# Patient Record
Sex: Female | Born: 1960
Health system: Southern US, Community
[De-identification: ages and names within clinical notes are randomized; demographics above are authoritative.]

## PROBLEM LIST (undated history)

## (undated) DIAGNOSIS — R519 Headache, unspecified: Secondary | ICD-10-CM

## (undated) DIAGNOSIS — K219 Gastro-esophageal reflux disease without esophagitis: Secondary | ICD-10-CM

## (undated) DIAGNOSIS — G4730 Sleep apnea, unspecified: Secondary | ICD-10-CM

## (undated) DIAGNOSIS — M1990 Unspecified osteoarthritis, unspecified site: Secondary | ICD-10-CM

## (undated) DIAGNOSIS — R0600 Dyspnea, unspecified: Secondary | ICD-10-CM

## (undated) DIAGNOSIS — R7303 Prediabetes: Secondary | ICD-10-CM

## (undated) DIAGNOSIS — I739 Peripheral vascular disease, unspecified: Secondary | ICD-10-CM

## (undated) DIAGNOSIS — J189 Pneumonia, unspecified organism: Secondary | ICD-10-CM

## (undated) DIAGNOSIS — I10 Essential (primary) hypertension: Secondary | ICD-10-CM

## (undated) DIAGNOSIS — F329 Major depressive disorder, single episode, unspecified: Secondary | ICD-10-CM

## (undated) DIAGNOSIS — T8859XA Other complications of anesthesia, initial encounter: Secondary | ICD-10-CM

## (undated) DIAGNOSIS — I509 Heart failure, unspecified: Secondary | ICD-10-CM

## (undated) DIAGNOSIS — J45909 Unspecified asthma, uncomplicated: Secondary | ICD-10-CM

## (undated) HISTORY — PX: APPENDECTOMY: SHX54

## (undated) HISTORY — DX: Major depressive disorder, single episode, unspecified: F32.9

## (undated) HISTORY — PX: OTHER SURGICAL HISTORY: SHX169

## (undated) HISTORY — DX: Unspecified asthma, uncomplicated: J45.909

## (undated) HISTORY — PX: ABDOMINAL HYSTERECTOMY: SHX81

## (undated) HISTORY — PX: TONSILLECTOMY: SUR1361

---

## 2010-04-23 DIAGNOSIS — D329 Benign neoplasm of meninges, unspecified: Secondary | ICD-10-CM

## 2010-04-23 HISTORY — DX: Benign neoplasm of meninges, unspecified: D32.9

## 2010-04-26 DIAGNOSIS — R600 Localized edema: Secondary | ICD-10-CM

## 2010-04-26 DIAGNOSIS — I509 Heart failure, unspecified: Secondary | ICD-10-CM | POA: Insufficient documentation

## 2010-04-26 HISTORY — DX: Heart failure, unspecified: I50.9

## 2010-04-26 HISTORY — DX: Localized edema: R60.0

## 2010-05-07 DIAGNOSIS — R079 Chest pain, unspecified: Secondary | ICD-10-CM

## 2010-05-07 HISTORY — DX: Chest pain, unspecified: R07.9

## 2015-08-29 HISTORY — PX: OTHER SURGICAL HISTORY: SHX169

## 2015-08-29 HISTORY — PX: ABDOMINAL HYSTERECTOMY: SHX81

## 2016-02-11 DIAGNOSIS — E7800 Pure hypercholesterolemia, unspecified: Secondary | ICD-10-CM

## 2016-02-11 DIAGNOSIS — J449 Chronic obstructive pulmonary disease, unspecified: Secondary | ICD-10-CM

## 2016-02-11 DIAGNOSIS — Z720 Tobacco use: Secondary | ICD-10-CM | POA: Diagnosis present

## 2016-02-11 HISTORY — DX: Pure hypercholesterolemia, unspecified: E78.00

## 2016-02-11 HISTORY — DX: Chronic obstructive pulmonary disease, unspecified: J44.9

## 2017-11-09 DIAGNOSIS — J441 Chronic obstructive pulmonary disease with (acute) exacerbation: Secondary | ICD-10-CM

## 2017-11-09 HISTORY — DX: Chronic obstructive pulmonary disease with (acute) exacerbation: J44.1

## 2018-03-05 DIAGNOSIS — J984 Other disorders of lung: Secondary | ICD-10-CM | POA: Insufficient documentation

## 2018-03-05 HISTORY — DX: Other disorders of lung: J98.4

## 2018-05-01 DIAGNOSIS — J4530 Mild persistent asthma, uncomplicated: Secondary | ICD-10-CM

## 2018-05-01 HISTORY — DX: Mild persistent asthma, uncomplicated: J45.30

## 2018-08-14 DIAGNOSIS — K219 Gastro-esophageal reflux disease without esophagitis: Secondary | ICD-10-CM | POA: Insufficient documentation

## 2018-08-14 HISTORY — DX: Gastro-esophageal reflux disease without esophagitis: K21.9

## 2018-08-28 DIAGNOSIS — J189 Pneumonia, unspecified organism: Secondary | ICD-10-CM

## 2018-08-28 HISTORY — DX: Pneumonia, unspecified organism: J18.9

## 2019-01-06 DIAGNOSIS — R002 Palpitations: Secondary | ICD-10-CM

## 2019-01-06 HISTORY — DX: Palpitations: R00.2

## 2019-08-11 DIAGNOSIS — I70211 Atherosclerosis of native arteries of extremities with intermittent claudication, right leg: Secondary | ICD-10-CM

## 2019-08-11 HISTORY — DX: Atherosclerosis of native arteries of extremities with intermittent claudication, right leg: I70.211

## 2020-07-20 DIAGNOSIS — Z8616 Personal history of COVID-19: Secondary | ICD-10-CM | POA: Insufficient documentation

## 2020-07-20 HISTORY — DX: Personal history of COVID-19: Z86.16

## 2020-11-10 DIAGNOSIS — M1711 Unilateral primary osteoarthritis, right knee: Secondary | ICD-10-CM

## 2020-11-10 DIAGNOSIS — G510 Bell's palsy: Secondary | ICD-10-CM

## 2020-11-10 DIAGNOSIS — Z9989 Dependence on other enabling machines and devices: Secondary | ICD-10-CM | POA: Insufficient documentation

## 2020-11-10 HISTORY — DX: Unilateral primary osteoarthritis, right knee: M17.11

## 2020-11-10 HISTORY — DX: Bell's palsy: G51.0

## 2021-05-16 ENCOUNTER — Encounter (HOSPITAL_COMMUNITY): Payer: Self-pay | Admitting: *Deleted

## 2021-05-16 ENCOUNTER — Emergency Department (HOSPITAL_COMMUNITY): Payer: Medicare HMO

## 2021-05-16 ENCOUNTER — Emergency Department (HOSPITAL_COMMUNITY)
Admission: EM | Admit: 2021-05-16 | Discharge: 2021-05-16 | Disposition: A | Discharge status: Home / Self Care | Payer: Medicare HMO | Attending: Emergency Medicine | Admitting: Emergency Medicine

## 2021-05-16 ENCOUNTER — Encounter: Payer: Self-pay | Admitting: Emergency Medicine

## 2021-05-16 ENCOUNTER — Emergency Department
Admission: EM | Admit: 2021-05-16 | Discharge: 2021-05-16 | Disposition: A | Discharge status: Home / Self Care | Payer: Medicare HMO | Source: Home / Self Care

## 2021-05-16 DIAGNOSIS — M7989 Other specified soft tissue disorders: Secondary | ICD-10-CM | POA: Insufficient documentation

## 2021-05-16 DIAGNOSIS — W19XXXA Unspecified fall, initial encounter: Secondary | ICD-10-CM | POA: Insufficient documentation

## 2021-05-16 DIAGNOSIS — M25462 Effusion, left knee: Secondary | ICD-10-CM | POA: Diagnosis not present

## 2021-05-16 DIAGNOSIS — Z5321 Procedure and treatment not carried out due to patient leaving prior to being seen by health care provider: Secondary | ICD-10-CM | POA: Diagnosis not present

## 2021-05-16 DIAGNOSIS — M25562 Pain in left knee: Secondary | ICD-10-CM

## 2021-05-16 HISTORY — DX: Essential (primary) hypertension: I10

## 2021-05-16 HISTORY — DX: Heart failure, unspecified: I50.9

## 2021-05-16 MED ORDER — ACETAMINOPHEN 325 MG PO TABS
650.0000 mg | ORAL_TABLET | Freq: Once | ORAL | Status: AC
  Administered 2021-05-16: 650 mg via ORAL
  Filled 2021-05-16: qty 2

## 2021-05-16 NOTE — Discharge Instructions (Addendum)
Advised/encouraged patient to wear left knee brace 24/7 (except when bathing) until follow-up with orthopedic provider.  Advised patient to follow-up with [facility] health orthopedic provider tomorrow, Tuesday, 05/17/2021.

## 2021-05-16 NOTE — ED Provider Notes (Signed)
[facility]    CSN: [id number] Arrival date & time: 05/16/21  2018      History   Chief Complaint Chief Complaint  Patient presents with   Knee Injury    HPI Kaitlan Bin is a 60 y.o. female.   HPI 60 year old female presents with left knee pain since injury 1 day ago.  Patient reports falling and tripping over a step stool yesterday when trying to fix her washer machine.  Patient is currently wearing knee braces on both knees.  Patient had knee x-ray performed earlier at other [facility] health urgent care clinic.  Past Medical History:  Diagnosis Date   CHF (congestive heart failure) ([place])    Hypertension     There are no problems to display for this patient.   History reviewed. No pertinent surgical history.  OB History   No obstetric history on file.      Home Medications    Prior to Admission medications   Medication Sig Start Date End Date Taking? Authorizing Provider  diclofenac (VOLTAREN) 75 MG EC tablet Take 75 mg by mouth daily. 03/10/21  Yes [provider]  losartan-hydrochlorothiazide (HYZAAR) 100-25 MG tablet losartan 100 mg-hydrochlorothiazide 25 mg tablet   Yes [provider]  simvastatin (ZOCOR) 40 MG tablet Take by mouth.   Yes [provider]    Family History History reviewed. No pertinent family history.  Social History Social History   Tobacco Use   Smoking status: Every Day    Types: Cigarettes   Smokeless tobacco: Never  Vaping Use   Vaping Use: Never used  Substance Use Topics   Alcohol use: Not Currently   Drug use: Never     Allergies   Codeine and Iodine   Review of Systems Review of Systems  Musculoskeletal:        Left knee pain x1 day secondary to left knee injury  All other systems reviewed and are negative.   Physical Exam Triage Vital Signs ED Triage Vitals  Enc Vitals Group     BP 05/16/21 2034 120/75     Pulse Rate 05/16/21 2034 80     Resp 05/16/21 2034 18     Temp  05/16/21 2034 98.4 F (36.9 C)     Temp Source 05/16/21 2034 Oral     SpO2 05/16/21 2034 94 %     Weight --      Height --      Head Circumference --      Peak Flow --      Pain Score 05/16/21 2029 8     Pain Loc --      Pain Edu? --      Excl. in [place]? --    No data found.  Updated Vital Signs BP 120/75 (BP Location: Left Arm)   Pulse 80   Temp 98.4 F (36.9 C) (Oral)   Resp 18   SpO2 94%   Physical Exam Vitals and nursing note reviewed.  Constitutional:      General: She is not in acute distress.    Appearance: Normal appearance. She is obese. She is not ill-appearing.  HENT:     Head: Normocephalic and atraumatic.     Mouth/Throat:     Mouth: Mucous membranes are moist.     Pharynx: Oropharynx is clear.  Eyes:     Extraocular Movements: Extraocular movements intact.     Conjunctiva/sclera: Conjunctivae normal.     Pupils: Pupils are equal, round, and reactive to  light.  Cardiovascular:     Rate and Rhythm: Normal rate and regular rhythm.     Pulses: Normal pulses.     Heart sounds: Normal heart sounds. No murmur heard. Pulmonary:     Effort: Pulmonary effort is normal.     Breath sounds: Normal breath sounds.     Comments: No adventitious breath sounds noted Musculoskeletal:        General: Swelling, tenderness and signs of injury present. No deformity.     Cervical back: Normal range of motion and neck supple.     Comments: Left knee (anterior aspect): TTP over patellar surface, with soft tissue swelling noted, exam limited this evening due to pain  Skin:    General: Skin is warm and dry.  Neurological:     General: No focal deficit present.     Mental Status: She is alert and oriented to person, place, and time. Mental status is at baseline.  Psychiatric:        Mood and Affect: Mood normal.        Behavior: Behavior normal.     UC Treatments / Results  Labs (all labs ordered are listed, but only abnormal results are displayed) Labs Reviewed - No data  to display  EKG   Radiology DG Knee Complete 4 Views Left  Result Date: 05/16/2021 CLINICAL DATA:  [place] yesterday with knee pain EXAM: LEFT KNEE - COMPLETE 4+ VIEW COMPARISON:  None. FINDINGS: Large knee joint effusion. Chronic tricompartmental osteoarthritis with joint space narrowing and osteophytes. Question loose body posterior to the distal femur. I do not see an acute fracture. IMPRESSION: Large joint effusion. Tricompartmental osteoarthritis with suspicion of a loose body posterior to the femur. No acute fracture identified. Electronically Signed   By: Nelson Chimes M.D.   On: 05/16/2021 13:31    Procedures Procedures (including critical care time)  Medications Ordered in UC Medications - No data to display  Initial Impression / Assessment and Plan / UC Course  I have reviewed the triage vital signs and the nursing notes.  Pertinent labs & imaging results that were available during my care of the patient were reviewed by me and considered in my medical decision making (see chart for details).     MDM: 1.  Acute pain of left knee-Advised/encouraged patient to wear left knee brace 24/7 (except when bathing) until follow-up with orthopedic provider.  Advised patient to follow-up with [facility] health orthopedic provider tomorrow, Tuesday, 05/17/2021.  Patient discharged home, hemodynamically stable. Final Clinical Impressions(s) / UC Diagnoses   Final diagnoses:  Acute pain of left knee     Discharge Instructions      Advised/encouraged patient to wear left knee brace 24/7 (except when bathing) until follow-up with orthopedic provider.  Advised patient to follow-up with [facility] health orthopedic provider tomorrow, Tuesday, 05/17/2021.     ED Prescriptions   None    PDMP not reviewed this encounter.   Eliezer Lofts, [place] 05/16/21 2115

## 2021-05-16 NOTE — ED Triage Notes (Addendum)
Patient presents to Urgent Care with complaints of left knee injury since 1 day ago. Patient reports falling/tripping of step stool yesterday when trying to fix her washing machine. Is currently wearing knee braces on bilateral knees. Applied ice, heat and epsom salt to the knee.  She already had xray of knee.

## 2021-05-16 NOTE — ED Triage Notes (Signed)
Pt complains of left knee pain and swelling since falling yesterday.

## 2021-05-17 ENCOUNTER — Ambulatory Visit: Payer: Self-pay

## 2021-05-17 ENCOUNTER — Ambulatory Visit: Payer: Medicare HMO | Admitting: Family Medicine

## 2021-05-17 ENCOUNTER — Other Ambulatory Visit: Payer: Self-pay

## 2021-05-17 VITALS — Ht 62.0 in | Wt 180.0 lb

## 2021-05-17 DIAGNOSIS — M179 Osteoarthritis of knee, unspecified: Secondary | ICD-10-CM | POA: Insufficient documentation

## 2021-05-17 DIAGNOSIS — M1712 Unilateral primary osteoarthritis, left knee: Secondary | ICD-10-CM

## 2021-05-17 DIAGNOSIS — M25562 Pain in left knee: Secondary | ICD-10-CM

## 2021-05-17 MED ORDER — TRIAMCINOLONE ACETONIDE 40 MG/ML IJ SUSP
40.0000 mg | Freq: Once | INTRAMUSCULAR | Status: AC
  Administered 2021-05-17: 40 mg via INTRA_ARTICULAR

## 2021-05-17 NOTE — Progress Notes (Signed)
Samantha Luna - 60 y.o. female MRN [id number]  Date of birth: 11-30-1960  SUBJECTIVE:  Including CC & ROS.  No chief complaint on file.   Samantha Luna is a 60 y.o. female that is presenting with acute on chronic left knee pain.  She had a fall recently straight onto the knee.  Since that time she has had swelling and pain globally around the knee.  Independent review of the left knee x-ray from 9/19 shows large joint effusion with suspicion for loose body in the posterior femur.   Review of Systems See HPI   HISTORY: Past Medical, Surgical, Social, and Family History Reviewed & Updated per EMR.   Pertinent Historical Findings include:  Past Medical History:  Diagnosis Date   CHF (congestive heart failure) ([place])    Hypertension     No past surgical history on file.  No family history on file.  Social History   Socioeconomic History   Marital status: Single    Spouse name: Not on file   Number of children: Not on file   Years of education: Not on file   Highest education level: Not on file  Occupational History   Not on file  Tobacco Use   Smoking status: Every Day    Types: Cigarettes   Smokeless tobacco: Never  Vaping Use   Vaping Use: Never used  Substance and Sexual Activity   Alcohol use: Not Currently   Drug use: Never   Sexual activity: Not Currently  Other Topics Concern   Not on file  Social History Narrative   Not on file   Social Determinants of Health   Financial Resource Strain: Not on file  Food Insecurity: Not on file  Transportation Needs: Not on file  Physical Activity: Not on file  Stress: Not on file  Social Connections: Not on file  Intimate Partner Violence: Not on file     PHYSICAL EXAM:  VS: Ht 5\' 2"  (1.575 m)   Wt 180 lb (81.6 kg)   BMI 32.92 kg/m  Physical Exam Gen: NAD, alert, cooperative with exam, well-appearing   Limited ultrasound: Left knee:  Moderate effusion. Normal-appearing quadricep patellar tendon. Severe joint  space narrowing of the medial compartment  Summary: Osteoarthritis with effusion  Ultrasound and interpretation by Clearance Coots, MD   Aspiration/Injection Procedure Note Samantha Luna [birth year]  Procedure: Aspiration and Injection Indications: Left knee pain  Procedure Details Consent: Risks of procedure as well as the alternatives and risks of each were explained to the (patient/caregiver).  Consent for procedure obtained. Time Out: Verified patient identification, verified procedure, site/side was marked, verified correct patient position, special equipment/implants available, medications/allergies/relevent history reviewed, required imaging and test results available.  Performed.  The area was cleaned with iodine and alcohol swabs.    The left knee superior lateral suprapatellar pouch was injected using 3 cc of 1% lidocaine on a 25-gauge 1-1/2 inch needle.  An 18-gauge 1-1/2 inch needle was used to achieve aspiration.  The syringe was switched to mixture containing 1 cc's of 40 mg Kenalog and 4 cc's of 0.25% bupivacaine was injected.  Ultrasound was used. Images were obtained in long views showing the injection.    Amount of Fluid Aspirated:  14mL Character of Fluid: bloody Fluid was sent for: n/a  A sterile dressing was applied.  Patient did tolerate procedure well.     ASSESSMENT & PLAN:   Primary osteoarthritis of left knee Had a recent fall onto her knee.  Having a large  hemarthrosis with severe degenerative changes on exam today. -Counseled on home exercise therapy and supportive care. -Aspiration and injection. -Referral to orthopedic surgery for consideration of arthroplasty

## 2021-05-17 NOTE — Patient Instructions (Signed)
Nice to meet you Please try ice  Please try the exercises   Please send me a message in MyChart with any questions or updates.  Please see me back in 3 weeks.   --Dr. Raeford Razor

## 2021-05-17 NOTE — Assessment & Plan Note (Signed)
Had a recent fall onto her knee.  Having a large hemarthrosis with severe degenerative changes on exam today. -Counseled on home exercise therapy and supportive care. -Aspiration and injection. -Referral to orthopedic surgery for consideration of arthroplasty

## 2021-05-17 NOTE — ED Notes (Signed)
Pt was still on the board at 0800 on 05/17/21 - discharged off board by this RN - no care provided

## 2021-05-20 DIAGNOSIS — E876 Hypokalemia: Secondary | ICD-10-CM | POA: Diagnosis not present

## 2021-05-20 DIAGNOSIS — I739 Peripheral vascular disease, unspecified: Secondary | ICD-10-CM | POA: Diagnosis not present

## 2021-05-20 DIAGNOSIS — E669 Obesity, unspecified: Secondary | ICD-10-CM | POA: Diagnosis not present

## 2021-05-20 DIAGNOSIS — F4323 Adjustment disorder with mixed anxiety and depressed mood: Secondary | ICD-10-CM | POA: Diagnosis not present

## 2021-05-20 DIAGNOSIS — R7303 Prediabetes: Secondary | ICD-10-CM | POA: Diagnosis not present

## 2021-05-20 DIAGNOSIS — Z720 Tobacco use: Secondary | ICD-10-CM | POA: Diagnosis not present

## 2021-05-20 DIAGNOSIS — M25562 Pain in left knee: Secondary | ICD-10-CM | POA: Diagnosis not present

## 2021-05-20 DIAGNOSIS — E782 Mixed hyperlipidemia: Secondary | ICD-10-CM | POA: Diagnosis not present

## 2021-05-20 DIAGNOSIS — I10 Essential (primary) hypertension: Secondary | ICD-10-CM | POA: Diagnosis not present

## 2021-06-03 DIAGNOSIS — Z720 Tobacco use: Secondary | ICD-10-CM | POA: Diagnosis not present

## 2021-06-03 DIAGNOSIS — M25562 Pain in left knee: Secondary | ICD-10-CM | POA: Diagnosis not present

## 2021-06-03 DIAGNOSIS — I10 Essential (primary) hypertension: Secondary | ICD-10-CM | POA: Diagnosis not present

## 2021-06-03 DIAGNOSIS — E876 Hypokalemia: Secondary | ICD-10-CM | POA: Diagnosis not present

## 2021-06-03 DIAGNOSIS — E782 Mixed hyperlipidemia: Secondary | ICD-10-CM | POA: Diagnosis not present

## 2021-06-03 DIAGNOSIS — R7303 Prediabetes: Secondary | ICD-10-CM | POA: Diagnosis not present

## 2021-06-07 DIAGNOSIS — Z01419 Encounter for gynecological examination (general) (routine) without abnormal findings: Secondary | ICD-10-CM | POA: Diagnosis not present

## 2021-06-07 DIAGNOSIS — N814 Uterovaginal prolapse, unspecified: Secondary | ICD-10-CM | POA: Diagnosis not present

## 2021-06-07 DIAGNOSIS — Z124 Encounter for screening for malignant neoplasm of cervix: Secondary | ICD-10-CM | POA: Diagnosis not present

## 2021-06-08 ENCOUNTER — Ambulatory Visit: Payer: Self-pay

## 2021-06-08 ENCOUNTER — Encounter: Payer: Self-pay | Admitting: Family Medicine

## 2021-06-08 ENCOUNTER — Ambulatory Visit (INDEPENDENT_AMBULATORY_CARE_PROVIDER_SITE_OTHER): Payer: Medicare HMO | Admitting: Family Medicine

## 2021-06-08 VITALS — Ht 62.0 in | Wt 180.0 lb

## 2021-06-08 DIAGNOSIS — M1712 Unilateral primary osteoarthritis, left knee: Secondary | ICD-10-CM

## 2021-06-08 NOTE — Patient Instructions (Signed)
Good to see you Please try ice as needed   Please send me a message in MyChart with any questions or updates.  Please see me back in 4 weeks or as needed.   --Dr. Raeford Razor

## 2021-06-08 NOTE — Progress Notes (Signed)
  Samantha Luna - 60 y.o. female MRN [id number]  Date of birth: 1960-09-04  SUBJECTIVE:  Including CC & ROS.  No chief complaint on file.   Samantha Luna is a 60 y.o. female that is here with acute ongoing left knee pain.  We have tried a steroid injection with aspiration and she got some improvement.  She has an appointment with a surgeon to discuss arthroplasty.   Review of Systems See HPI   HISTORY: Past Medical, Surgical, Social, and Family History Reviewed & Updated per EMR.   Pertinent Historical Findings include:  Past Medical History:  Diagnosis Date   CHF (congestive heart failure) ([place])    Hypertension     History reviewed. No pertinent surgical history.  History reviewed. No pertinent family history.  Social History   Socioeconomic History   Marital status: Single    Spouse name: Not on file   Number of children: Not on file   Years of education: Not on file   Highest education level: Not on file  Occupational History   Not on file  Tobacco Use   Smoking status: Every Day    Types: Cigarettes   Smokeless tobacco: Never  Vaping Use   Vaping Use: Never used  Substance and Sexual Activity   Alcohol use: Not Currently   Drug use: Never   Sexual activity: Not Currently  Other Topics Concern   Not on file  Social History Narrative   Not on file   Social Determinants of Health   Financial Resource Strain: Not on file  Food Insecurity: Not on file  Transportation Needs: Not on file  Physical Activity: Not on file  Stress: Not on file  Social Connections: Not on file  Intimate Partner Violence: Not on file     PHYSICAL EXAM:  VS: Ht 5\' 2"  (1.575 m)   Wt 180 lb (81.6 kg)   BMI 32.92 kg/m  Physical Exam Gen: NAD, alert, cooperative with exam, well-appearing   Genicular Nerve Block Procedure Note Samantha Luna 1961/01/20  Procedure: Injection Indications: left knee pain   Procedure Details Consent: Risks of procedure as well as the alternatives and  risks of each were explained to the (patient/caregiver).  Consent for procedure obtained. Time Out: Verified patient identification, verified procedure, site/side was marked, verified correct patient position, special equipment/implants available, medications/allergies/relevent history reviewed, required imaging and test results available.  Performed.  The area was cleaned with iodine and alcohol swabs.    The left knee superomedial, inferomedial, and superolateral genicular nerves was injected using 2 cc's of 1% lidocaine and 3 cc's of 0.25% bupivacaine with a 25 1 1/2" needle.  Ultrasound was used. Images were obtained in short views showing the injection.     A sterile dressing was applied.  Patient did tolerate procedure well.      ASSESSMENT & PLAN:   Primary osteoarthritis of left knee Acutely worsening left knee pain. -Counseled on home exercise therapy and supportive care. -Genicular nerve block today.  Had a greater than 50% improvement of her pain reduction. -Referral to Dr. Ron Agee as above for nerve ablation. -Referral to physical therapy.

## 2021-06-08 NOTE — Assessment & Plan Note (Signed)
Acutely worsening left knee pain. -Counseled on home exercise therapy and supportive care. -Genicular nerve block today.  Had a greater than 50% improvement of her pain reduction. -Referral to Dr. Ron Agee as above for nerve ablation. -Referral to physical therapy.

## 2021-06-10 ENCOUNTER — Telehealth: Payer: Self-pay | Admitting: Family Medicine

## 2021-06-10 DIAGNOSIS — M25562 Pain in left knee: Secondary | ICD-10-CM | POA: Diagnosis not present

## 2021-06-10 MED ORDER — DICLOFENAC SODIUM 75 MG PO TBEC: 75.0000 mg | DELAYED_RELEASE_TABLET | Freq: Two times a day (BID) | ORAL | 1 refills | Status: DC

## 2021-06-10 NOTE — Telephone Encounter (Signed)
Spoke with patient having worsening of her pain.  Was seen by the PM&R doctor today.  We will send her diclofenac.  She will keep updated on her symptoms.  Rosemarie Ax, MD [facility] 06/10/2021, 12:30 PM

## 2021-06-10 NOTE — Telephone Encounter (Signed)
Patient & daughter called states Samantha Luna is is experiencing total leg numbness and ask for provider & or nurse to call back to discuss.   Pt's  ph# [phone]   Advised daughter/Samantha Luna there is (No) DPR on file in pt's chart allowing [place] to spk with her & suggest she have pt present to give Verbal consent to share medical info & or to speak w/daughter regarding health matters.  --Forwarding message to med asst.  -glh

## 2021-06-13 DIAGNOSIS — M25562 Pain in left knee: Secondary | ICD-10-CM | POA: Diagnosis not present

## 2021-06-13 DIAGNOSIS — M1712 Unilateral primary osteoarthritis, left knee: Secondary | ICD-10-CM | POA: Diagnosis not present

## 2021-06-13 DIAGNOSIS — M6281 Muscle weakness (generalized): Secondary | ICD-10-CM | POA: Diagnosis not present

## 2021-06-13 DIAGNOSIS — M25662 Stiffness of left knee, not elsewhere classified: Secondary | ICD-10-CM | POA: Diagnosis not present

## 2021-06-13 DIAGNOSIS — R262 Difficulty in walking, not elsewhere classified: Secondary | ICD-10-CM | POA: Diagnosis not present

## 2021-06-15 DIAGNOSIS — R262 Difficulty in walking, not elsewhere classified: Secondary | ICD-10-CM | POA: Diagnosis not present

## 2021-06-15 DIAGNOSIS — M6281 Muscle weakness (generalized): Secondary | ICD-10-CM | POA: Diagnosis not present

## 2021-06-15 DIAGNOSIS — M25562 Pain in left knee: Secondary | ICD-10-CM | POA: Diagnosis not present

## 2021-06-15 DIAGNOSIS — M1712 Unilateral primary osteoarthritis, left knee: Secondary | ICD-10-CM | POA: Diagnosis not present

## 2021-06-15 DIAGNOSIS — M25662 Stiffness of left knee, not elsewhere classified: Secondary | ICD-10-CM | POA: Diagnosis not present

## 2021-06-16 DIAGNOSIS — M25562 Pain in left knee: Secondary | ICD-10-CM | POA: Diagnosis not present

## 2021-06-16 DIAGNOSIS — M1712 Unilateral primary osteoarthritis, left knee: Secondary | ICD-10-CM | POA: Diagnosis not present

## 2021-06-17 DIAGNOSIS — M25562 Pain in left knee: Secondary | ICD-10-CM | POA: Diagnosis not present

## 2021-06-17 DIAGNOSIS — M1712 Unilateral primary osteoarthritis, left knee: Secondary | ICD-10-CM | POA: Diagnosis not present

## 2021-06-17 DIAGNOSIS — M6281 Muscle weakness (generalized): Secondary | ICD-10-CM | POA: Diagnosis not present

## 2021-06-17 DIAGNOSIS — M25662 Stiffness of left knee, not elsewhere classified: Secondary | ICD-10-CM | POA: Diagnosis not present

## 2021-06-17 DIAGNOSIS — R262 Difficulty in walking, not elsewhere classified: Secondary | ICD-10-CM | POA: Diagnosis not present

## 2021-06-20 DIAGNOSIS — M6281 Muscle weakness (generalized): Secondary | ICD-10-CM | POA: Diagnosis not present

## 2021-06-20 DIAGNOSIS — M1712 Unilateral primary osteoarthritis, left knee: Secondary | ICD-10-CM | POA: Diagnosis not present

## 2021-06-20 DIAGNOSIS — M25662 Stiffness of left knee, not elsewhere classified: Secondary | ICD-10-CM | POA: Diagnosis not present

## 2021-06-20 DIAGNOSIS — R262 Difficulty in walking, not elsewhere classified: Secondary | ICD-10-CM | POA: Diagnosis not present

## 2021-06-20 DIAGNOSIS — M25562 Pain in left knee: Secondary | ICD-10-CM | POA: Diagnosis not present

## 2021-06-22 DIAGNOSIS — M1712 Unilateral primary osteoarthritis, left knee: Secondary | ICD-10-CM | POA: Diagnosis not present

## 2021-06-22 DIAGNOSIS — M25662 Stiffness of left knee, not elsewhere classified: Secondary | ICD-10-CM | POA: Diagnosis not present

## 2021-06-22 DIAGNOSIS — M25562 Pain in left knee: Secondary | ICD-10-CM | POA: Diagnosis not present

## 2021-06-22 DIAGNOSIS — R262 Difficulty in walking, not elsewhere classified: Secondary | ICD-10-CM | POA: Diagnosis not present

## 2021-06-22 DIAGNOSIS — M6281 Muscle weakness (generalized): Secondary | ICD-10-CM | POA: Diagnosis not present

## 2021-06-23 ENCOUNTER — Telehealth (HOSPITAL_BASED_OUTPATIENT_CLINIC_OR_DEPARTMENT_OTHER): Payer: Self-pay | Admitting: Family

## 2021-06-23 NOTE — Telephone Encounter (Signed)
Received clearance from Dr. Ricki Rodriguez of [facility] for preop clearance for left total knee arthroplasty scheduled for 10/03/2021.  Chart reviewed. Patient has never been seen by [facility] nor Dr. Oval Linsey.   Called [facility] to make them aware. They will call us if she is in need of new patient appointment.   Loel Dubonnet, NP

## 2021-06-24 DIAGNOSIS — M25562 Pain in left knee: Secondary | ICD-10-CM | POA: Diagnosis not present

## 2021-06-24 DIAGNOSIS — M6281 Muscle weakness (generalized): Secondary | ICD-10-CM | POA: Diagnosis not present

## 2021-06-24 DIAGNOSIS — M1712 Unilateral primary osteoarthritis, left knee: Secondary | ICD-10-CM | POA: Diagnosis not present

## 2021-06-24 DIAGNOSIS — R262 Difficulty in walking, not elsewhere classified: Secondary | ICD-10-CM | POA: Diagnosis not present

## 2021-06-24 DIAGNOSIS — M25662 Stiffness of left knee, not elsewhere classified: Secondary | ICD-10-CM | POA: Diagnosis not present

## 2021-06-27 DIAGNOSIS — M25662 Stiffness of left knee, not elsewhere classified: Secondary | ICD-10-CM | POA: Diagnosis not present

## 2021-06-27 DIAGNOSIS — M6281 Muscle weakness (generalized): Secondary | ICD-10-CM | POA: Diagnosis not present

## 2021-06-27 DIAGNOSIS — M1712 Unilateral primary osteoarthritis, left knee: Secondary | ICD-10-CM | POA: Diagnosis not present

## 2021-06-27 DIAGNOSIS — R262 Difficulty in walking, not elsewhere classified: Secondary | ICD-10-CM | POA: Diagnosis not present

## 2021-06-27 DIAGNOSIS — M25562 Pain in left knee: Secondary | ICD-10-CM | POA: Diagnosis not present

## 2021-06-28 DIAGNOSIS — Z0000 Encounter for general adult medical examination without abnormal findings: Secondary | ICD-10-CM | POA: Diagnosis not present

## 2021-06-28 DIAGNOSIS — Z008 Encounter for other general examination: Secondary | ICD-10-CM | POA: Diagnosis not present

## 2021-06-28 DIAGNOSIS — Z6835 Body mass index (BMI) 35.0-35.9, adult: Secondary | ICD-10-CM | POA: Diagnosis not present

## 2021-06-28 DIAGNOSIS — E669 Obesity, unspecified: Secondary | ICD-10-CM | POA: Diagnosis not present

## 2021-06-28 DIAGNOSIS — Z602 Problems related to living alone: Secondary | ICD-10-CM | POA: Diagnosis not present

## 2021-06-29 DIAGNOSIS — R262 Difficulty in walking, not elsewhere classified: Secondary | ICD-10-CM | POA: Diagnosis not present

## 2021-06-29 DIAGNOSIS — M25562 Pain in left knee: Secondary | ICD-10-CM | POA: Diagnosis not present

## 2021-06-29 DIAGNOSIS — M25662 Stiffness of left knee, not elsewhere classified: Secondary | ICD-10-CM | POA: Diagnosis not present

## 2021-06-29 DIAGNOSIS — M6281 Muscle weakness (generalized): Secondary | ICD-10-CM | POA: Diagnosis not present

## 2021-06-29 DIAGNOSIS — M1712 Unilateral primary osteoarthritis, left knee: Secondary | ICD-10-CM | POA: Diagnosis not present

## 2021-07-01 DIAGNOSIS — M25562 Pain in left knee: Secondary | ICD-10-CM | POA: Diagnosis not present

## 2021-07-01 DIAGNOSIS — M25662 Stiffness of left knee, not elsewhere classified: Secondary | ICD-10-CM | POA: Diagnosis not present

## 2021-07-01 DIAGNOSIS — M6281 Muscle weakness (generalized): Secondary | ICD-10-CM | POA: Diagnosis not present

## 2021-07-01 DIAGNOSIS — R262 Difficulty in walking, not elsewhere classified: Secondary | ICD-10-CM | POA: Diagnosis not present

## 2021-07-01 DIAGNOSIS — M1712 Unilateral primary osteoarthritis, left knee: Secondary | ICD-10-CM | POA: Diagnosis not present

## 2021-07-04 DIAGNOSIS — R262 Difficulty in walking, not elsewhere classified: Secondary | ICD-10-CM | POA: Diagnosis not present

## 2021-07-04 DIAGNOSIS — M6281 Muscle weakness (generalized): Secondary | ICD-10-CM | POA: Diagnosis not present

## 2021-07-04 DIAGNOSIS — M25562 Pain in left knee: Secondary | ICD-10-CM | POA: Diagnosis not present

## 2021-07-04 DIAGNOSIS — M25662 Stiffness of left knee, not elsewhere classified: Secondary | ICD-10-CM | POA: Diagnosis not present

## 2021-07-04 DIAGNOSIS — M1712 Unilateral primary osteoarthritis, left knee: Secondary | ICD-10-CM | POA: Diagnosis not present

## 2021-07-06 DIAGNOSIS — M1712 Unilateral primary osteoarthritis, left knee: Secondary | ICD-10-CM | POA: Diagnosis not present

## 2021-07-06 DIAGNOSIS — M6281 Muscle weakness (generalized): Secondary | ICD-10-CM | POA: Diagnosis not present

## 2021-07-06 DIAGNOSIS — R262 Difficulty in walking, not elsewhere classified: Secondary | ICD-10-CM | POA: Diagnosis not present

## 2021-07-06 DIAGNOSIS — M25562 Pain in left knee: Secondary | ICD-10-CM | POA: Diagnosis not present

## 2021-07-06 DIAGNOSIS — M25662 Stiffness of left knee, not elsewhere classified: Secondary | ICD-10-CM | POA: Diagnosis not present

## 2021-07-08 DIAGNOSIS — M25662 Stiffness of left knee, not elsewhere classified: Secondary | ICD-10-CM | POA: Diagnosis not present

## 2021-07-08 DIAGNOSIS — M1712 Unilateral primary osteoarthritis, left knee: Secondary | ICD-10-CM | POA: Diagnosis not present

## 2021-07-08 DIAGNOSIS — M6281 Muscle weakness (generalized): Secondary | ICD-10-CM | POA: Diagnosis not present

## 2021-07-08 DIAGNOSIS — R262 Difficulty in walking, not elsewhere classified: Secondary | ICD-10-CM | POA: Diagnosis not present

## 2021-07-08 DIAGNOSIS — M25562 Pain in left knee: Secondary | ICD-10-CM | POA: Diagnosis not present

## 2021-07-15 DIAGNOSIS — M25562 Pain in left knee: Secondary | ICD-10-CM | POA: Diagnosis not present

## 2021-07-15 DIAGNOSIS — M1712 Unilateral primary osteoarthritis, left knee: Secondary | ICD-10-CM | POA: Diagnosis not present

## 2021-07-15 DIAGNOSIS — M25662 Stiffness of left knee, not elsewhere classified: Secondary | ICD-10-CM | POA: Diagnosis not present

## 2021-07-15 DIAGNOSIS — M6281 Muscle weakness (generalized): Secondary | ICD-10-CM | POA: Diagnosis not present

## 2021-07-15 DIAGNOSIS — R262 Difficulty in walking, not elsewhere classified: Secondary | ICD-10-CM | POA: Diagnosis not present

## 2021-07-18 DIAGNOSIS — M25562 Pain in left knee: Secondary | ICD-10-CM | POA: Diagnosis not present

## 2021-07-18 DIAGNOSIS — M6281 Muscle weakness (generalized): Secondary | ICD-10-CM | POA: Diagnosis not present

## 2021-07-18 DIAGNOSIS — R262 Difficulty in walking, not elsewhere classified: Secondary | ICD-10-CM | POA: Diagnosis not present

## 2021-07-18 DIAGNOSIS — M25662 Stiffness of left knee, not elsewhere classified: Secondary | ICD-10-CM | POA: Diagnosis not present

## 2021-07-18 DIAGNOSIS — M1712 Unilateral primary osteoarthritis, left knee: Secondary | ICD-10-CM | POA: Diagnosis not present

## 2021-07-20 DIAGNOSIS — M1712 Unilateral primary osteoarthritis, left knee: Secondary | ICD-10-CM | POA: Diagnosis not present

## 2021-07-20 DIAGNOSIS — M25562 Pain in left knee: Secondary | ICD-10-CM | POA: Diagnosis not present

## 2021-07-20 DIAGNOSIS — R262 Difficulty in walking, not elsewhere classified: Secondary | ICD-10-CM | POA: Diagnosis not present

## 2021-07-20 DIAGNOSIS — M25662 Stiffness of left knee, not elsewhere classified: Secondary | ICD-10-CM | POA: Diagnosis not present

## 2021-07-20 DIAGNOSIS — M6281 Muscle weakness (generalized): Secondary | ICD-10-CM | POA: Diagnosis not present

## 2021-07-25 DIAGNOSIS — M25562 Pain in left knee: Secondary | ICD-10-CM | POA: Diagnosis not present

## 2021-07-25 DIAGNOSIS — R262 Difficulty in walking, not elsewhere classified: Secondary | ICD-10-CM | POA: Diagnosis not present

## 2021-07-25 DIAGNOSIS — M1712 Unilateral primary osteoarthritis, left knee: Secondary | ICD-10-CM | POA: Diagnosis not present

## 2021-07-25 DIAGNOSIS — M6281 Muscle weakness (generalized): Secondary | ICD-10-CM | POA: Diagnosis not present

## 2021-07-25 DIAGNOSIS — M25662 Stiffness of left knee, not elsewhere classified: Secondary | ICD-10-CM | POA: Diagnosis not present

## 2021-07-26 DIAGNOSIS — N814 Uterovaginal prolapse, unspecified: Secondary | ICD-10-CM | POA: Diagnosis not present

## 2021-07-26 DIAGNOSIS — Z90711 Acquired absence of uterus with remaining cervical stump: Secondary | ICD-10-CM | POA: Diagnosis not present

## 2021-07-27 DIAGNOSIS — M6281 Muscle weakness (generalized): Secondary | ICD-10-CM | POA: Diagnosis not present

## 2021-07-27 DIAGNOSIS — R262 Difficulty in walking, not elsewhere classified: Secondary | ICD-10-CM | POA: Diagnosis not present

## 2021-07-27 DIAGNOSIS — M1712 Unilateral primary osteoarthritis, left knee: Secondary | ICD-10-CM | POA: Diagnosis not present

## 2021-07-27 DIAGNOSIS — M25562 Pain in left knee: Secondary | ICD-10-CM | POA: Diagnosis not present

## 2021-07-27 DIAGNOSIS — M25662 Stiffness of left knee, not elsewhere classified: Secondary | ICD-10-CM | POA: Diagnosis not present

## 2021-07-29 DIAGNOSIS — M6281 Muscle weakness (generalized): Secondary | ICD-10-CM | POA: Diagnosis not present

## 2021-07-29 DIAGNOSIS — M25662 Stiffness of left knee, not elsewhere classified: Secondary | ICD-10-CM | POA: Diagnosis not present

## 2021-07-29 DIAGNOSIS — M1712 Unilateral primary osteoarthritis, left knee: Secondary | ICD-10-CM | POA: Diagnosis not present

## 2021-07-29 DIAGNOSIS — M25562 Pain in left knee: Secondary | ICD-10-CM | POA: Diagnosis not present

## 2021-07-29 DIAGNOSIS — R262 Difficulty in walking, not elsewhere classified: Secondary | ICD-10-CM | POA: Diagnosis not present

## 2021-08-01 DIAGNOSIS — M6281 Muscle weakness (generalized): Secondary | ICD-10-CM | POA: Diagnosis not present

## 2021-08-01 DIAGNOSIS — M25662 Stiffness of left knee, not elsewhere classified: Secondary | ICD-10-CM | POA: Diagnosis not present

## 2021-08-01 DIAGNOSIS — M25562 Pain in left knee: Secondary | ICD-10-CM | POA: Diagnosis not present

## 2021-08-01 DIAGNOSIS — M1712 Unilateral primary osteoarthritis, left knee: Secondary | ICD-10-CM | POA: Diagnosis not present

## 2021-08-01 DIAGNOSIS — R262 Difficulty in walking, not elsewhere classified: Secondary | ICD-10-CM | POA: Diagnosis not present

## 2021-08-03 DIAGNOSIS — M6281 Muscle weakness (generalized): Secondary | ICD-10-CM | POA: Diagnosis not present

## 2021-08-03 DIAGNOSIS — M25562 Pain in left knee: Secondary | ICD-10-CM | POA: Diagnosis not present

## 2021-08-03 DIAGNOSIS — R262 Difficulty in walking, not elsewhere classified: Secondary | ICD-10-CM | POA: Diagnosis not present

## 2021-08-03 DIAGNOSIS — M25662 Stiffness of left knee, not elsewhere classified: Secondary | ICD-10-CM | POA: Diagnosis not present

## 2021-08-03 DIAGNOSIS — M1712 Unilateral primary osteoarthritis, left knee: Secondary | ICD-10-CM | POA: Diagnosis not present

## 2021-08-05 DIAGNOSIS — M25662 Stiffness of left knee, not elsewhere classified: Secondary | ICD-10-CM | POA: Diagnosis not present

## 2021-08-05 DIAGNOSIS — M1712 Unilateral primary osteoarthritis, left knee: Secondary | ICD-10-CM | POA: Diagnosis not present

## 2021-08-05 DIAGNOSIS — M6281 Muscle weakness (generalized): Secondary | ICD-10-CM | POA: Diagnosis not present

## 2021-08-05 DIAGNOSIS — R262 Difficulty in walking, not elsewhere classified: Secondary | ICD-10-CM | POA: Diagnosis not present

## 2021-08-05 DIAGNOSIS — M25562 Pain in left knee: Secondary | ICD-10-CM | POA: Diagnosis not present

## 2021-08-08 DIAGNOSIS — M6281 Muscle weakness (generalized): Secondary | ICD-10-CM | POA: Diagnosis not present

## 2021-08-08 DIAGNOSIS — R262 Difficulty in walking, not elsewhere classified: Secondary | ICD-10-CM | POA: Diagnosis not present

## 2021-08-08 DIAGNOSIS — M25562 Pain in left knee: Secondary | ICD-10-CM | POA: Diagnosis not present

## 2021-08-08 DIAGNOSIS — M25662 Stiffness of left knee, not elsewhere classified: Secondary | ICD-10-CM | POA: Diagnosis not present

## 2021-08-08 DIAGNOSIS — M1712 Unilateral primary osteoarthritis, left knee: Secondary | ICD-10-CM | POA: Diagnosis not present

## 2021-08-09 DIAGNOSIS — M25662 Stiffness of left knee, not elsewhere classified: Secondary | ICD-10-CM | POA: Diagnosis not present

## 2021-08-09 DIAGNOSIS — M1712 Unilateral primary osteoarthritis, left knee: Secondary | ICD-10-CM | POA: Diagnosis not present

## 2021-08-09 DIAGNOSIS — M25562 Pain in left knee: Secondary | ICD-10-CM | POA: Diagnosis not present

## 2021-08-10 ENCOUNTER — Encounter: Payer: Self-pay | Admitting: Physician Assistant

## 2021-08-10 ENCOUNTER — Ambulatory Visit: Payer: Medicare HMO | Admitting: Physician Assistant

## 2021-08-10 ENCOUNTER — Other Ambulatory Visit (INDEPENDENT_AMBULATORY_CARE_PROVIDER_SITE_OTHER): Payer: Medicare HMO

## 2021-08-10 ENCOUNTER — Other Ambulatory Visit: Payer: Self-pay

## 2021-08-10 VITALS — BP 133/70 | HR 93 | Ht 60.0 in | Wt 199.2 lb

## 2021-08-10 DIAGNOSIS — M25662 Stiffness of left knee, not elsewhere classified: Secondary | ICD-10-CM | POA: Diagnosis not present

## 2021-08-10 DIAGNOSIS — F4321 Adjustment disorder with depressed mood: Secondary | ICD-10-CM

## 2021-08-10 DIAGNOSIS — M25562 Pain in left knee: Secondary | ICD-10-CM | POA: Diagnosis not present

## 2021-08-10 DIAGNOSIS — R413 Other amnesia: Secondary | ICD-10-CM | POA: Diagnosis not present

## 2021-08-10 DIAGNOSIS — M1712 Unilateral primary osteoarthritis, left knee: Secondary | ICD-10-CM | POA: Diagnosis not present

## 2021-08-10 DIAGNOSIS — G3184 Mild cognitive impairment, so stated: Secondary | ICD-10-CM

## 2021-08-10 DIAGNOSIS — M6281 Muscle weakness (generalized): Secondary | ICD-10-CM | POA: Diagnosis not present

## 2021-08-10 DIAGNOSIS — Z86018 Personal history of other benign neoplasm: Secondary | ICD-10-CM | POA: Diagnosis not present

## 2021-08-10 DIAGNOSIS — R262 Difficulty in walking, not elsewhere classified: Secondary | ICD-10-CM | POA: Diagnosis not present

## 2021-08-10 LAB — TSH: TSH: 2.34 u[IU]/mL (ref 0.35–5.50)

## 2021-08-10 LAB — VITAMIN B12: Vitamin B-12: 935 pg/mL — ABNORMAL HIGH (ref 211–911)

## 2021-08-10 NOTE — Progress Notes (Signed)
Assessment/Plan:   Samantha Luna is a very pleasant 60 y.o. year old RH female with risk factors including CHF, HLD,  OA, OSA, asthma, tobacco use, COPD, h/o meningioma (2011) anxiety, depression and hypertension, chronic pain medications due to arthritis, seen today for evaluation of memory loss. MoCA today is 23/30 with deficiencies in language and attention.  There is suspicion for vascular etiology.  Delayed recall for/5, orientation 6/6.    Recommendations:   MIld Cognitive Impairment  MRI brain with/without contrast to assess for underlying structural abnormality and assess vascular load  Neurocognitive testing to further evaluate cognitive concerns and determine underlying cause of memory changes, including potential contribution from sleep, anxiety, or depression  Check B12, TSH Discussed safety both in and out of the home.  Discussed the importance of regular daily schedule with inclusion of crossword puzzles to maintain brain function.  Continue to monitor mood with PCP.  List of CBT providers given Stay active at least 30 minutes at least 3 times a week.  Naps should be scheduled and should be no longer than 60 minutes and should not occur after 2 PM.  Agree with referral for evaluation of sleep apnea by PCP Mediterranean diet is recommended  Folllow up once results above are available    Subjective:    The patient is seen in neurologic consultation at the request of Wendie Simmer, MD for the evaluation of memory.  The patient is accompanied by her daughter who supplements the history. This is a 60 y.o. year old RH  female who has had memory issues for about 1 year, after the death of her sister.  She noticed repeating the same stories, asking the same questions, and at times not knowing what she came to the room for.  She also reports that sometimes she "cannot come with the right word which causes a lot of frustration ".  She states that she understands the  instructions, and does not like when she cannot express what she is thinking.  She denies leaving objects in unusual places.  She continues to drive without getting lost.  Her daughter reports that she suffer significant amount of grief after her closest family members died, including her sister dying in June 2021.  Until then, she had "bottled up all those feelings, which she let go after the sister's death "-daughter reports, NP at a local PCP office.  She is in the process of seeking counseling to help her emotionally.  Here, she lives alone, and she is very independent with her ADLs.  She does crossword puzzles, likes to read the Bible, place "Bible games ", and also likes to play Candy crash.  She also is doing physical therapy in preparation for her left knee replacement, which keeps her active at least 3 times a week.  After the surgery she is planning to join the [facility] sneakers program.  She does not sleep well, especially when not wearing the CPAP.  She has an appointment in January for further evaluation and to see if she needs adjustment or replacement of the CPAP.  She states that when she does use it, she sleeps well, but for now, she is waking up every 2 hours, thus getting up very tired.  She denies vivid dreams or sleepwalking, or hallucinations.  The patient denies paranoia, but her daughter states that she constantly feels that she is talked about by her family members.  There are no hygiene concerns, she is independent of bathing  and dressing.  The medications are in a pillbox, "the problem is that she may forget the second dose, and the inhaler symptoms are mixed up "-Per daughter report.  Patient is independent of finances, denies missing any bills.  Her appetite is good, denies trouble swallowing.  She cooks without leaving the stove or the faucet on.  She ambulates with some difficulty, due to knee arthritis and has fallen a couple of times due to pain, but denies numbness, or  tingling in the extremities.  She denies any unilateral weakness.  She denies any history of stroke, but has significant vascular history in the family of stroke, and aneurysms.  She denies any recent headaches, nausea, vomiting, double vision, she does get dizzy if she does not take her blood pressure medication.  She denies any anosmia.  No tremors are reported, "only if I get a little nervous ".  No history of seizures.  Denies urine incontinence, retention, she has chronic constipation due to pain medicines.  She denies any history of alcohol.  She has decreased significantly her cigarette consumption, from 1 pack a day to down to 2 cigarettes a day, and she is planning to quit soon.  Family history remarkable for father with Alzheimer's disease, sister who had dementia prior to dying but no formal diagnosis of Alzheimer's.  There is significant vascular history in the family as mentioned above. 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      Allergies  Allergen Reactions   Codeine    Iodine     Current Outpatient Medications  Medication Instructions   buPROPion (WELLBUTRIN XL) 150 mg, Oral, 2 times daily   diclofenac (VOLTAREN) 75 mg, Oral, 2 times daily   fluticasone (FLOVENT HFA) 110 MCG/ACT inhaler Inhalation, 2 times daily   ibandronate (BONIVA) 150 MG tablet ibandronate 150 mg tablet  TAKE 1 TABLET BY MOUTH ONCE EVERY MONTH ON THE SAME DAY OF THE MONTH   losartan-hydrochlorothiazide (HYZAAR) 100-25 MG tablet losartan 100 mg-hydrochlorothiazide 25 mg tablet   omeprazole (PRILOSEC) 20 MG capsule omeprazole 20 mg capsule,delayed release  TAKE 1 CAPSULE BY MOUTH IN THE EVENING   simvastatin (ZOCOR) 40 MG tablet Oral   Vitamin D (Ergocalciferol) (DRISDOL) 50,000 Units, Oral, Weekly     VITALS:   Vitals:   08/10/21 0758  BP: 133/70  Pulse: 93  SpO2: 98%  Weight: 199 lb 3.2 oz (90.4 kg)  Height: 5' (1.524 m)   No flowsheet data found.  PHYSICAL EXAM   HEENT:  Normocephalic,  atraumatic. The mucous membranes are moist. The superficial temporal arteries are without ropiness or tenderness. Cardiovascular: Regular rate and rhythm. Lungs: Clear to auscultation bilaterally. Neck: There are no carotid bruits noted bilaterally.  NEUROLOGICAL: Montreal Cognitive Assessment  08/10/2021  Visuospatial/ Executive (0/5) 4  Naming (0/3) 2  Attention: Read list of digits (0/2) 2  Attention: Read list of letters (0/1) 0  Attention: Serial 7 subtraction starting at 100 (0/3) 2  Language: Repeat phrase (0/2) 1  Language : Fluency (0/1) 0  Abstraction (0/2) 1  Delayed Recall (0/5) 4  Orientation (0/6) 6  Total 22  Adjusted Score (based on education) 23   No flowsheet data found.  No flowsheet data found.   Orientation:  Alert and oriented to person, place and time. No aphasia or dysarthria. Fund of knowledge is appropriate. Recent memory impaired and remote memory intact.  Attention and concentration are reduced.  Able to name objects and repeat phrases. Delayed recall  4/5 Cranial nerves:  There is good facial symmetry. Extraocular muscles are intact and visual fields are full to confrontational testing. Speech is fluent and clear. Soft palate rises symmetrically and there is no tongue deviation. Hearing is intact to conversational tone. Tone: Tone is good throughout. Sensation: Sensation is intact to light touch and pinprick throughout. Vibration is intact at the bilateral big toe.There is no extinction with double simultaneous stimulation. There is no sensory dermatomal level identified. Coordination: The patient has no difficulty with RAM's or FNF bilaterally. Normal finger to nose  Motor: Strength is 5/5 in the bilateral upper and lower extremities. There is no pronator drift. There are no fasciculations noted. DTR's: Deep tendon reflexes are 2/4 at the bilateral biceps, triceps, brachioradialis, patella and achilles.  Plantar responses are downgoing bilaterally. Gait and  Station: The patient is able to ambulate without difficulty.The patient is able to heel toe walk without any difficulty.The patient is able to ambulate in a tandem fashion. The patient is able to stand in the Romberg position.     Thank you for allowing [place] the opportunity to participate in the care of this nice patient. Please do not hesitate to contact us for any questions or concerns.   Total time spent on today's visit was 75 minutes, including both face-to-face time and nonface-to-face time.  Time included that spent on review of records (prior notes available to me/labs/imaging if pertinent), discussing treatment and goals, answering patient's questions and coordinating care.  Cc:  Wendie Simmer, MD  Sharene Butters 08/10/2021 9:42 AM

## 2021-08-10 NOTE — Patient Instructions (Signed)
It was a pleasure to see you today at our office.   Recommendations:  Neurocognitive evaluation at our office MRI of the brain, the radiology office will call you to arrange you appointment Check labs today Follow up after neurocognitive testing CBT list provided to help you emotionally   RECOMMENDATIONS FOR ALL PATIENTS WITH MEMORY PROBLEMS: 1. Continue to exercise (Recommend 30 minutes of walking everyday, or 3 hours every week) 2. Increase social interactions - continue going to [place] and enjoy social gatherings with friends and family 3. Eat healthy, avoid fried foods and eat more fruits and vegetables 4. Maintain adequate blood pressure, blood sugar, and blood cholesterol level. Reducing the risk of stroke and cardiovascular disease also helps promoting better memory. 5. Avoid stressful situations. Live a simple life and avoid aggravations. Organize your time and prepare for the next day in anticipation. 6. Sleep well, avoid any interruptions of sleep and avoid any distractions in the bedroom that may interfere with adequate sleep quality 7. Avoid sugar, avoid sweets as there is a strong link between excessive sugar intake, diabetes, and cognitive impairment We discussed the Mediterranean diet, which has been shown to help patients reduce the risk of progressive memory disorders and reduces cardiovascular risk. This includes eating fish, eat fruits and green leafy vegetables, nuts like almonds and hazelnuts, walnuts, and also use olive oil. Avoid fast foods and fried foods as much as possible. Avoid sweets and sugar as sugar use has been linked to worsening of memory function.  There is always a concern of gradual progression of memory problems. If this is the case, then we may need to adjust level of care according to patient needs. Support, both to the patient and caregiver, should then be put into place.      You have been referred for a neuropsychological evaluation (i.e.,  evaluation of memory and thinking abilities). Please bring someone with you to this appointment if possible, as it is helpful for the doctor to hear from both you and another adult who knows you well. Please bring eyeglasses and hearing aids if you wear them.    The evaluation will take approximately 3 hours and has two parts:   The first part is a clinical interview with the neuropsychologist (Dr. Melvyn Novas or Dr. Nicole Kindred). During the interview, the neuropsychologist will speak with you and the individual you brought to the appointment.    The second part of the evaluation is testing with the doctor's technician Hinton Dyer or Maudie Mercury). During the testing, the technician will ask you to remember different types of material, solve problems, and answer some questionnaires. Your family member will not be present for this portion of the evaluation.   Please note: We must reserve several hours of the neuropsychologist's time and the psychometrician's time for your evaluation appointment. As such, there is a No-Show fee of $100. If you are unable to attend any of your appointments, please contact our office as soon as possible to reschedule.    FALL PRECAUTIONS: Be cautious when walking. Scan the area for obstacles that may increase the risk of trips and falls. When getting up in the mornings, sit up at the edge of the bed for a few minutes before getting out of bed. Consider elevating the bed at the head end to avoid drop of blood pressure when getting up. Walk always in a well-lit room (use night lights in the walls). Avoid area rugs or power cords from appliances in the middle of the walkways. Use  a walker or a cane if necessary and consider physical therapy for balance exercise. Get your eyesight checked regularly.  FINANCIAL OVERSIGHT: Supervision, especially oversight when making financial decisions or transactions is also recommended.  HOME SAFETY: Consider the safety of the kitchen when operating appliances like  stoves, microwave oven, and blender. Consider having supervision and share cooking responsibilities until no longer able to participate in those. Accidents with firearms and other hazards in the house should be identified and addressed as well.   ABILITY TO BE LEFT ALONE: If patient is unable to contact 911 operator, consider using LifeLine, or when the need is there, arrange for someone to stay with patients. Smoking is a fire hazard, consider supervision or cessation. Risk of wandering should be assessed by caregiver and if detected at any point, supervision and safe proof recommendations should be instituted.  MEDICATION SUPERVISION: Inability to self-administer medication needs to be constantly addressed. Implement a mechanism to ensure safe administration of the medications.   DRIVING: Regarding driving, in patients with progressive memory problems, driving will be impaired. We advise to have someone else do the driving if trouble finding directions or if minor accidents are reported. Independent driving assessment is available to determine safety of driving.   If you are interested in the driving assessment, you can contact the following:  [employer] in [place]  [place] [phone] or [phone]    [place] refers to food and lifestyle choices that are based on the traditions of countries located on the [employer]. This way of eating has been shown to help prevent certain conditions and improve outcomes for people who have chronic diseases, like kidney disease and heart disease. What are tips for following this plan? Lifestyle  Cook and eat meals together with your family, when possible. Drink enough fluid to keep your urine clear or pale yellow. Be physically active every day. This includes: Aerobic exercise like running  or swimming. Leisure activities like gardening, walking, or housework. Get 7-8 hours of sleep each night. If recommended by your health care provider, drink red wine in moderation. This means 1 glass a day for nonpregnant women and 2 glasses a day for men. A glass of wine equals 5 oz (150 mL). Reading food labels  Check the serving size of packaged foods. For foods such as rice and pasta, the serving size refers to the amount of cooked product, not dry. Check the total fat in packaged foods. Avoid foods that have saturated fat or trans fats. Check the ingredients list for added sugars, such as corn syrup. Shopping  At the grocery store, buy most of your food from the areas near the walls of the store. This includes: Fresh fruits and vegetables (produce). Grains, beans, nuts, and seeds. Some of these may be available in unpackaged forms or large amounts (in bulk). Fresh seafood. Poultry and eggs. Low-fat dairy products. Buy whole ingredients instead of prepackaged foods. Buy fresh fruits and vegetables in-season from local farmers markets. Buy frozen fruits and vegetables in resealable bags. If you do not have access to quality fresh seafood, buy precooked frozen shrimp or canned fish, such as tuna, salmon, or sardines. Buy small amounts of raw or cooked vegetables, salads, or olives from the deli or salad bar at your store. Stock your pantry so you always have certain foods on hand, such as olive oil, canned tuna, canned tomatoes, rice, pasta,  and beans. Cooking  Cook foods with extra-virgin olive oil instead of using butter or other vegetable oils. Have meat as a side dish, and have vegetables or grains as your main dish. This means having meat in small portions or adding small amounts of meat to foods like pasta or stew. Use beans or vegetables instead of meat in common dishes like chili or lasagna. Experiment with different cooking methods. Try roasting or broiling vegetables instead of  steaming or sauteing them. Add frozen vegetables to soups, stews, pasta, or rice. Add nuts or seeds for added healthy fat at each meal. You can add these to yogurt, salads, or vegetable dishes. Marinate fish or vegetables using olive oil, lemon juice, garlic, and fresh herbs. Meal planning  Plan to eat 1 vegetarian meal one day each week. Try to work up to 2 vegetarian meals, if possible. Eat seafood 2 or more times a week. Have healthy snacks readily available, such as: Vegetable sticks with hummus. Greek yogurt. Fruit and nut trail mix. Eat balanced meals throughout the week. This includes: Fruit: 2-3 servings a day Vegetables: 4-5 servings a day Low-fat dairy: 2 servings a day Fish, poultry, or lean meat: 1 serving a day Beans and legumes: 2 or more servings a week Nuts and seeds: 1-2 servings a day Whole grains: 6-8 servings a day Extra-virgin olive oil: 3-4 servings a day Limit red meat and sweets to only a few servings a month What are my food choices? Mediterranean diet Recommended Grains: Whole-grain pasta. Brown rice. Bulgar wheat. Polenta. Couscous. Whole-wheat bread. [place]. Vegetables: Artichokes. Beets. Broccoli. Cabbage. Carrots. Eggplant. Green beans. Chard. Kale. Spinach. Onions. Leeks. Peas. Squash. Tomatoes. Peppers. Radishes. Fruits: Apples. Apricots. Avocado. Berries. Bananas. Cherries. Dates. Figs. Grapes. Lemons. Melon. Oranges. Peaches. Plums. Pomegranate. Meats and other protein foods: Beans. Almonds. Sunflower seeds. Pine nuts. Peanuts. [place]. Salmon. Scallops. Shrimp. Delight. Tilapia. Clams. Oysters. Eggs. Dairy: Low-fat milk. Cheese. Greek yogurt. Beverages: Water. Red wine. Herbal tea. Fats and oils: Extra virgin olive oil. Avocado oil. Grape seed oil. Sweets and desserts: [place] yogurt with honey. Baked apples. Poached pears. Trail mix. Seasoning and other foods: Basil. Cilantro. Coriander. Cumin. Mint. Parsley. Sage. Rosemary. Tarragon. Garlic.  Oregano. Thyme. Pepper. Balsalmic vinegar. Tahini. Hummus. Tomato sauce. Olives. Mushrooms. Limit these Grains: Prepackaged pasta or rice dishes. Prepackaged cereal with added sugar. Vegetables: Deep fried potatoes (french fries). Fruits: Fruit canned in syrup. Meats and other protein foods: Beef. Pork. Lamb. Poultry with skin. Hot dogs. [place] Salines. Dairy: Ice cream. Sour cream. Whole milk. Beverages: Juice. Sugar-sweetened soft drinks. Beer. Liquor and spirits. Fats and oils: Butter. Canola oil. Vegetable oil. Beef fat (tallow). Lard. Sweets and desserts: Cookies. Cakes. Pies. Candy. Seasoning and other foods: Mayonnaise. Premade sauces and marinades. The items listed may not be a complete list. Talk with your dietitian about what dietary choices are right for you. Summary The Mediterranean diet includes both food and lifestyle choices. Eat a variety of fresh fruits and vegetables, beans, nuts, seeds, and whole grains. Limit the amount of red meat and sweets that you eat. Talk with your health care provider about whether it is safe for you to drink red wine in moderation. This means 1 glass a day for nonpregnant women and 2 glasses a day for men. A glass of wine equals 5 oz (150 mL). This information is not intended to replace advice given to you by your health care provider. Make sure you discuss any questions you have with your health care provider.  Document Released: 04/06/2016 Document Revised: 05/09/2016 Document Reviewed: 04/06/2016 Elsevier Interactive Patient Education  2017 [employer].

## 2021-08-12 ENCOUNTER — Encounter: Payer: Self-pay | Admitting: Physician Assistant

## 2021-08-12 DIAGNOSIS — M1712 Unilateral primary osteoarthritis, left knee: Secondary | ICD-10-CM | POA: Diagnosis not present

## 2021-08-12 DIAGNOSIS — R262 Difficulty in walking, not elsewhere classified: Secondary | ICD-10-CM | POA: Diagnosis not present

## 2021-08-12 DIAGNOSIS — M25562 Pain in left knee: Secondary | ICD-10-CM | POA: Diagnosis not present

## 2021-08-12 DIAGNOSIS — M25662 Stiffness of left knee, not elsewhere classified: Secondary | ICD-10-CM | POA: Diagnosis not present

## 2021-08-12 DIAGNOSIS — M6281 Muscle weakness (generalized): Secondary | ICD-10-CM | POA: Diagnosis not present

## 2021-08-15 DIAGNOSIS — M25562 Pain in left knee: Secondary | ICD-10-CM | POA: Diagnosis not present

## 2021-08-15 DIAGNOSIS — M6281 Muscle weakness (generalized): Secondary | ICD-10-CM | POA: Diagnosis not present

## 2021-08-15 DIAGNOSIS — M25662 Stiffness of left knee, not elsewhere classified: Secondary | ICD-10-CM | POA: Diagnosis not present

## 2021-08-15 DIAGNOSIS — M1712 Unilateral primary osteoarthritis, left knee: Secondary | ICD-10-CM | POA: Diagnosis not present

## 2021-08-15 DIAGNOSIS — R262 Difficulty in walking, not elsewhere classified: Secondary | ICD-10-CM | POA: Diagnosis not present

## 2021-08-17 ENCOUNTER — Encounter (HOSPITAL_COMMUNITY): Payer: Self-pay | Admitting: Emergency Medicine

## 2021-08-17 ENCOUNTER — Observation Stay (HOSPITAL_COMMUNITY)
Admission: EM | Admit: 2021-08-17 | Discharge: 2021-08-18 | Disposition: A | Discharge status: Home / Self Care | Payer: Medicare HMO | Attending: Family Medicine | Admitting: Family Medicine

## 2021-08-17 ENCOUNTER — Emergency Department (HOSPITAL_COMMUNITY): Payer: Medicare HMO

## 2021-08-17 ENCOUNTER — Other Ambulatory Visit: Payer: Self-pay

## 2021-08-17 DIAGNOSIS — R001 Bradycardia, unspecified: Secondary | ICD-10-CM | POA: Diagnosis not present

## 2021-08-17 DIAGNOSIS — R0789 Other chest pain: Secondary | ICD-10-CM | POA: Diagnosis not present

## 2021-08-17 DIAGNOSIS — R072 Precordial pain: Principal | ICD-10-CM | POA: Insufficient documentation

## 2021-08-17 DIAGNOSIS — Z6837 Body mass index (BMI) 37.0-37.9, adult: Secondary | ICD-10-CM | POA: Diagnosis not present

## 2021-08-17 DIAGNOSIS — G4733 Obstructive sleep apnea (adult) (pediatric): Secondary | ICD-10-CM | POA: Diagnosis not present

## 2021-08-17 DIAGNOSIS — E7800 Pure hypercholesterolemia, unspecified: Secondary | ICD-10-CM | POA: Diagnosis not present

## 2021-08-17 DIAGNOSIS — Z7982 Long term (current) use of aspirin: Secondary | ICD-10-CM | POA: Insufficient documentation

## 2021-08-17 DIAGNOSIS — F17210 Nicotine dependence, cigarettes, uncomplicated: Secondary | ICD-10-CM | POA: Insufficient documentation

## 2021-08-17 DIAGNOSIS — Z720 Tobacco use: Secondary | ICD-10-CM | POA: Diagnosis not present

## 2021-08-17 DIAGNOSIS — Z79899 Other long term (current) drug therapy: Secondary | ICD-10-CM | POA: Diagnosis not present

## 2021-08-17 DIAGNOSIS — R0602 Shortness of breath: Secondary | ICD-10-CM | POA: Diagnosis not present

## 2021-08-17 DIAGNOSIS — M179 Osteoarthritis of knee, unspecified: Secondary | ICD-10-CM | POA: Diagnosis present

## 2021-08-17 DIAGNOSIS — I110 Hypertensive heart disease with heart failure: Secondary | ICD-10-CM | POA: Diagnosis not present

## 2021-08-17 DIAGNOSIS — Z20822 Contact with and (suspected) exposure to covid-19: Secondary | ICD-10-CM | POA: Insufficient documentation

## 2021-08-17 DIAGNOSIS — E785 Hyperlipidemia, unspecified: Secondary | ICD-10-CM | POA: Diagnosis not present

## 2021-08-17 DIAGNOSIS — J449 Chronic obstructive pulmonary disease, unspecified: Secondary | ICD-10-CM | POA: Insufficient documentation

## 2021-08-17 DIAGNOSIS — I509 Heart failure, unspecified: Secondary | ICD-10-CM | POA: Diagnosis not present

## 2021-08-17 DIAGNOSIS — R109 Unspecified abdominal pain: Secondary | ICD-10-CM | POA: Diagnosis not present

## 2021-08-17 DIAGNOSIS — I10 Essential (primary) hypertension: Secondary | ICD-10-CM | POA: Diagnosis not present

## 2021-08-17 DIAGNOSIS — R079 Chest pain, unspecified: Secondary | ICD-10-CM

## 2021-08-17 DIAGNOSIS — M1712 Unilateral primary osteoarthritis, left knee: Secondary | ICD-10-CM | POA: Diagnosis not present

## 2021-08-17 DIAGNOSIS — J439 Emphysema, unspecified: Secondary | ICD-10-CM | POA: Diagnosis not present

## 2021-08-17 DIAGNOSIS — I700 Atherosclerosis of aorta: Secondary | ICD-10-CM | POA: Diagnosis not present

## 2021-08-17 LAB — MAGNESIUM: Magnesium: 1.9 mg/dL (ref 1.7–2.4)

## 2021-08-17 LAB — BASIC METABOLIC PANEL
Anion gap: 8 (ref 5–15)
BUN: 13 mg/dL (ref 8–23)
CO2: 22 mmol/L (ref 22–32)
Calcium: 9.7 mg/dL (ref 8.9–10.3)
Chloride: 107 mmol/L (ref 98–111)
Creatinine, Ser: 0.7 mg/dL (ref 0.44–1.00)
GFR, Estimated: >60 mL/min (ref >60)
Glucose, Bld: 88 mg/dL (ref 70–99)
Potassium: 3.6 mmol/L (ref 3.5–5.1)
Sodium: 137 mmol/L (ref 135–145)

## 2021-08-17 LAB — CBC
HCT: 41.3 % (ref 36.0–46.0)
Hemoglobin: 13.6 g/dL (ref 12.0–15.0)
MCH: 29.8 pg (ref 26.0–34.0)
MCHC: 32.9 g/dL (ref 30.0–36.0)
MCV: 90.6 fL (ref 80.0–100.0)
Platelets: 315 10*3/uL (ref 150–400)
RBC: 4.56 MIL/uL (ref 3.87–5.11)
RDW: 13.2 % (ref 11.5–15.5)
WBC: 5.7 10*3/uL (ref 4.0–10.5)
nRBC: 0 % (ref 0.0–0.2)

## 2021-08-17 LAB — HIV ANTIBODY (ROUTINE TESTING W REFLEX): HIV Screen 4th Generation wRfx: NONREACTIVE

## 2021-08-17 LAB — TROPONIN I (HIGH SENSITIVITY)
Troponin I (High Sensitivity): 5 ng/L (ref <18)
Troponin I (High Sensitivity): 6 ng/L (ref <18)

## 2021-08-17 LAB — RESP PANEL BY RT-PCR (FLU A&B, COVID) ARPGX2
Influenza A by PCR: NEGATIVE
Influenza B by PCR: NEGATIVE
SARS Coronavirus 2 by RT PCR: NEGATIVE

## 2021-08-17 MED ORDER — METOPROLOL TARTRATE 50 MG PO TABS: 50.0000 mg | ORAL_TABLET | Freq: Once | ORAL | Status: DC

## 2021-08-17 MED ORDER — NICOTINE 7 MG/24HR TD PT24
7.0000 mg | MEDICATED_PATCH | Freq: Every day | TRANSDERMAL | Status: DC
  Administered 2021-08-18: 10:00:00 7 mg via TRANSDERMAL
  Filled 2021-08-17 (×3): qty 1

## 2021-08-17 MED ORDER — MORPHINE SULFATE (PF) 4 MG/ML IV SOLN
4.0000 mg | Freq: Once | INTRAVENOUS | Status: AC
  Administered 2021-08-17: 12:00:00 4 mg via INTRAVENOUS
  Filled 2021-08-17: qty 1

## 2021-08-17 MED ORDER — BUPROPION HCL ER (XL) 150 MG PO TB24
150.0000 mg | ORAL_TABLET | Freq: Two times a day (BID) | ORAL | Status: DC
  Administered 2021-08-17 – 2021-08-18 (×2): 150 mg via ORAL
  Filled 2021-08-17 (×2): qty 1

## 2021-08-17 MED ORDER — DIPHENHYDRAMINE HCL 25 MG PO CAPS
50.0000 mg | ORAL_CAPSULE | Freq: Once | ORAL | Status: AC
  Administered 2021-08-18: 09:00:00 50 mg via ORAL
  Filled 2021-08-17: qty 2

## 2021-08-17 MED ORDER — BUDESONIDE 0.25 MG/2ML IN SUSP
0.2500 mg | Freq: Two times a day (BID) | RESPIRATORY_TRACT | Status: DC
  Administered 2021-08-17: 19:00:00 0.25 mg via RESPIRATORY_TRACT
  Filled 2021-08-17 (×2): qty 2

## 2021-08-17 MED ORDER — ACETAMINOPHEN 650 MG RE SUPP: 650.0000 mg | Freq: Four times a day (QID) | RECTAL | Status: DC | PRN

## 2021-08-17 MED ORDER — PANTOPRAZOLE SODIUM 40 MG PO TBEC
40.0000 mg | DELAYED_RELEASE_TABLET | Freq: Every day | ORAL | Status: DC
  Administered 2021-08-18: 09:00:00 40 mg via ORAL
  Filled 2021-08-17: qty 1

## 2021-08-17 MED ORDER — ASPIRIN EC 81 MG PO TBEC
81.0000 mg | DELAYED_RELEASE_TABLET | Freq: Every day | ORAL | Status: DC
  Administered 2021-08-18: 09:00:00 81 mg via ORAL
  Filled 2021-08-17: qty 1

## 2021-08-17 MED ORDER — ACETAMINOPHEN 325 MG PO TABS
650.0000 mg | ORAL_TABLET | Freq: Four times a day (QID) | ORAL | Status: DC | PRN
  Filled 2021-08-17: qty 2

## 2021-08-17 MED ORDER — PREDNISONE 50 MG PO TABS
50.0000 mg | ORAL_TABLET | Freq: Four times a day (QID) | ORAL | Status: AC
  Administered 2021-08-17 – 2021-08-18 (×3): 50 mg via ORAL
  Filled 2021-08-17 (×3): qty 1

## 2021-08-17 MED ORDER — FLUTICASONE PROPIONATE HFA 110 MCG/ACT IN AERO
1.0000 | INHALATION_SPRAY | Freq: Two times a day (BID) | RESPIRATORY_TRACT | Status: DC
Start: 2021-08-17 — End: 2021-08-17

## 2021-08-17 MED ORDER — ROSUVASTATIN CALCIUM 5 MG PO TABS
5.0000 mg | ORAL_TABLET | Freq: Every day | ORAL | Status: DC
  Administered 2021-08-18: 09:00:00 5 mg via ORAL
  Filled 2021-08-17: qty 1

## 2021-08-17 MED ORDER — ENOXAPARIN SODIUM 40 MG/0.4ML IJ SOSY
40.0000 mg | PREFILLED_SYRINGE | INTRAMUSCULAR | Status: DC
  Administered 2021-08-18: 09:00:00 40 mg via SUBCUTANEOUS
  Filled 2021-08-17: qty 0.4

## 2021-08-17 MED ORDER — NITROGLYCERIN 2 % TD OINT
1.0000 [in_us] | TOPICAL_OINTMENT | Freq: Once | TRANSDERMAL | Status: AC
  Administered 2021-08-17: 12:00:00 1 [in_us] via TOPICAL
  Filled 2021-08-17: qty 1

## 2021-08-17 MED ORDER — METOPROLOL TARTRATE 100 MG PO TABS
100.0000 mg | ORAL_TABLET | Freq: Once | ORAL | Status: AC
  Administered 2021-08-18: 09:00:00 100 mg via ORAL
  Filled 2021-08-17: qty 1

## 2021-08-17 MED ORDER — ASPIRIN EC 81 MG PO TBEC: 81.0000 mg | DELAYED_RELEASE_TABLET | Freq: Once | ORAL | Status: DC

## 2021-08-17 MED ORDER — ONDANSETRON HCL 4 MG/2ML IJ SOLN
4.0000 mg | Freq: Once | INTRAMUSCULAR | Status: AC
  Administered 2021-08-17: 12:00:00 4 mg via INTRAVENOUS
  Filled 2021-08-17: qty 2

## 2021-08-17 NOTE — ED Notes (Signed)
Patient transported to CT 

## 2021-08-17 NOTE — Consult Note (Signed)
Cardiology Consult   Patient ID: Samantha [place] MRN: [id number]; DOB: 18-Apr-1961   Admission date: 08/17/2021  PCP:  Wendie Simmer, MD   [facility] Providers Cardiologist:  None   New ([place])   Chief Complaint:  Chest pain  Patient Profile:   Samantha Luna is a 60 y.o. female with HTN, HLD, OSA on Cpap, PVD and tobacco use who is being seen 08/17/2021 for the evaluation of chest pain.  History of Present Illness:   Samantha Luna is a 60 yo female with PMH noted above.  Patient reports she was living in [place] up until June of this year.  She was followed by allergy there.  Reports a history of angina and had undergone cardiac work-up with echocardiogram and stress testing which were normal per her/daughter's report.  She has been on medications for hypertension, hyperlipidemia and also carries a history of peripheral vascular disease with reported stenting in her left leg.  States she has been in her usual state of health up until earlier this week.  Reports she has been having intermittent episodes of left sided sharp chest pain up underneath her breasts which would last for several minutes at a time over the past week.  Did not associate with exertion or rest.  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  She did not have nausea or vomiting but did have some shortness of breath.  She called her daughter with her symptoms and daughter instructed her to take sublingual nitro as well as aspirin and called EMS.  She took a total of 2 sublingual nitroglycerin with improvement in her symptoms.  States her chest discomfort mostly resolved while in the ED but now complaining of left-sided abdominal pain with radiation up into her left shoulder.  This seems to be more bothersome than the chest discomfort now.   In the ED her labs showed sodium 137, potassium 3.6,  creatinine 0.7, high-sensitivity troponin 5>> 6, WBC 5.7, hemoglobin 13.6.  EKG showed sinus rhythm, 76 bpm, nonspecific changes.  Chest x-ray negative.  Cardiology now asked to evaluate.   Past Medical History:  Diagnosis Date   CHF (congestive heart failure) ([place])    Hypertension     History reviewed. No pertinent surgical history.   Medications Prior to Admission: Prior to Admission medications   Medication Sig Start Date End Date Taking? Authorizing Provider  buPROPion (WELLBUTRIN XL) 150 MG 24 hr tablet Take 150 mg by mouth 2 (two) times daily.    [provider]  diclofenac (VOLTAREN) 75 MG EC tablet Take 1 tablet (75 mg total) by mouth 2 (two) times daily. 06/10/21   Rosemarie Ax, MD  fluticasone (FLOVENT HFA) 110 MCG/ACT inhaler Inhale into the lungs 2 (two) times daily.    [provider]  ibandronate (BONIVA) 150 MG tablet ibandronate 150 mg tablet  TAKE 1 TABLET BY MOUTH ONCE EVERY MONTH ON THE SAME DAY OF THE MONTH    [provider]  losartan-hydrochlorothiazide (HYZAAR) 100-25 MG tablet losartan 100 mg-hydrochlorothiazide 25 mg tablet    [provider]  omeprazole (PRILOSEC) 20 MG capsule omeprazole 20 mg capsule,delayed release  TAKE 1 CAPSULE BY MOUTH IN THE EVENING 11/05/20   [provider]  simvastatin (ZOCOR) 40 MG tablet Take by mouth.    [provider]  Vitamin D, Ergocalciferol, (DRISDOL) 1.25 MG (50000 UNIT) CAPS  capsule Take 50,000 Units by mouth once a week. 03/09/21   [provider]     Allergies:    Allergies  Allergen Reactions   Codeine    Iodine     Social History:   Social History   Socioeconomic History   Marital status: Single    Spouse name: Not on file   Number of children: Not on file   Years of education: Not on file   Highest education level: Not on file  Occupational History   Not on file  Tobacco Use   Smoking status: Every Day    Types: Cigarettes   Smokeless  tobacco: Never  Vaping Use   Vaping Use: Never used  Substance and Sexual Activity   Alcohol use: Yes    Comment: wine occ   Drug use: Never   Sexual activity: Not Currently  Other Topics Concern   Not on file  Social History Narrative   Not on file   Social Determinants of Health   Financial Resource Strain: Not on file  Food Insecurity: Not on file  Transportation Needs: Not on file  Physical Activity: Not on file  Stress: Not on file  Social Connections: Not on file  Intimate Partner Violence: Not on file    Family History:   The patient's family history is not on file.    ROS:  Please see the history of present illness.  All other ROS reviewed and negative.     Physical Exam/Data:   Vitals:   08/17/21 0910 08/17/21 0911 08/17/21 1216  BP:  121/76 125/71  Pulse:  75   Resp:  18 18  Temp:  98.5 F (36.9 C)   TempSrc:  Oral   SpO2:  98%   Weight: 86.2 kg    Height: 5' (1.524 m)     No intake or output data in the 24 hours ending 08/17/21 1303 Last 3 Weights 08/17/2021 08/10/2021 06/08/2021  Weight (lbs) 190 lb 199 lb 3.2 oz 180 lb  Weight (kg) 86.183 kg 90.357 kg 81.647 kg     Body mass index is 37.11 kg/m.  General:  Well nourished, well developed, in no acute distress HEENT: normal Neck: no JVD Vascular: No carotid bruits; Distal pulses 2+ bilaterally   Cardiac:  normal S1, S2; RRR; no murmur  Lungs:  clear to auscultation bilaterally, no wheezing, rhonchi or rales  Abd: soft, nontender, no hepatomegaly  Ext: no edema Musculoskeletal:  No deformities, BUE and BLE strength normal and equal Skin: warm and dry  Neuro:  CNs 2-12 intact, no focal abnormalities noted Psych:  Normal affect    EKG:  The ECG that was done 08/17/21 was personally reviewed and demonstrates SR, 76 bpm nonspecific changes   Relevant CV Studies:  N/a   Laboratory Data:  High Sensitivity Troponin:   Recent Labs  Lab 08/17/21 0920 08/17/21 1130  TROPONINIHS 5 6       Chemistry Recent Labs  Lab 08/17/21 0920  NA 137  K 3.6  CL 107  CO2 22  GLUCOSE 88  BUN 13  CREATININE 0.70  CALCIUM 9.7  GFRNONAA >60  ANIONGAP 8    No results for input(s): PROT, ALBUMIN, AST, ALT, ALKPHOS, BILITOT in the last 168 hours. Lipids No results for input(s): CHOL, TRIG, HDL, LABVLDL, LDLCALC, CHOLHDL in the last 168 hours. Hematology Recent Labs  Lab 08/17/21 0920  WBC 5.7  RBC 4.56  HGB 13.6  HCT 41.3  MCV 90.6  MCH  29.8  MCHC 32.9  RDW 13.2  PLT 315   Thyroid No results for input(s): TSH, FREET4 in the last 168 hours. BNPNo results for input(s): BNP, PROBNP in the last 168 hours.  DDimer No results for input(s): DDIMER in the last 168 hours.   Radiology/Studies:  DG Chest 2 View  Result Date: 08/17/2021 CLINICAL DATA:  Chest pain, shortness of breath EXAM: CHEST - 2 VIEW COMPARISON:  None. FINDINGS: The heart size and mediastinal contours are within normal limits. Both lungs are clear. The visualized skeletal structures are unremarkable. IMPRESSION: No active cardiopulmonary disease. Electronically Signed   By: Keane Police D.O.   On: 08/17/2021 09:30     Assessment and Plan:   Samantha Luna is a 60 y.o. female with HTN who is being seen 08/17/2021 for the evaluation of chest pain.  Chest pain: Symptoms have been intermittent over the past week. 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 EKG without acute ischemia. hsTn negative x2. Chest pain free currently but complaining of left sided upper abd pain. She does have risk factors with HTN, HLD, PVD and tobacco use. Discussed coronary CTA which she was agreeable to. Will need premeds for iodine allergy, therefore will order tapered course with plan for CT tomorrow  Abd pain: reports new abd pain with radiation into the left arm. No n/v. This seems to be more bothersome than her chest discomfort currently.  -- would suggest work up per IM  HTN: on Hyzaar PTA  HLD: on Zocor -- check  lipids  OSA: on Cpap  PVD: reports prior stenting in the left leg  Tobacco use: has been reducing her tobacco use, down to 2 cigarettes/day  Risk Assessment/Risk Scores:   HEAR Score (for undifferentiated chest pain):  HEAR Score: 5{   For questions or updates, please contact [place] Please consult [email or web address] for contact info under     Signed, Reino Bellis, NP  08/17/2021 1:03 PM

## 2021-08-17 NOTE — Progress Notes (Signed)
Patient educated on need to have an extra IV access place for CCTA in the morning, pt refused. She stated she does want to be stuck now to let her rest and we can place the iv right before she leaves for said procedure.

## 2021-08-17 NOTE — ED Notes (Signed)
The patient informed this EMT that her chest pain has returned, she has become nauseous, and has pain located in her neck on the left side

## 2021-08-17 NOTE — H&P (Signed)
[facility] Admission History and Physical Service Pager: [phone]  Patient name: Samantha Luna Medical record number: [id number] Date of birth: March 09, 1961 Age: 60 y.o. Gender: female  Primary Care Provider: Wendie Simmer, MD Consultants: Cardiology Code Status: Full Preferred Emergency Contact:   Name Relation Home Work [place] Daughter   [phone]    Chief Complaint: chest pain  Assessment and Plan: Samantha Luna is a 60 y.o. female presenting with chest pain and shortness of breath. PMH is significant for CHF, HLD, OQ, OSA, asthma, tobacco use, COPD, h/o meningioma (2011), anxiety/depression, HTN, knee OA with chronic pain medications.  Chest pain Patient presents with sudden onset of chest pain and shortness of breath this morning at 0700 while she was cleaning her carpets. Vitals stable on admission. Troponins obtained in the ED were negative x2 and the chest pain self-resolved. EKG did not show any acute ischemic changes. CXR with no active cardiopulmonary disease. Patient was evaluated by cardiology who felt that due to her multiple risk factors (HTN, HLD, PVD, tobacco use) she warranted observation overnight and a coronary CTA tomorrow morning. Chest pain has subsided at this time. However, given response to SL nitroglycerin, remain concerned for underlying ischemic disease. Patient has a dye allergy and will need to be pre-medicated with benadryl and prednisone prior to scan.  - Admit to [place], attending Dr. Thompson Grayer - Cardiology consulted, appreciate care and recommendations - Continuous cardiac telemetry - CTA Coronary tomorrow - Benadryl and prednisone premedication prior to CTA - Tylenol PRN for pain - If CTA is not concerning, will likely discharge tomorrow - A1c and Lipid panel with AM labs   Intermittent Abdomina pain, stable Patient presents with abdominal pain that occurred soon after her chest pain occurrence. Pain was  cramping in nature and has since resolved. CT Abdomen/Pelvis obtained in the ED did not show any acute findings in the abdomen or pelvis. Home medications include omeprazole 20mg  daily for GERD. Given rapid resolution without intervention, do not think that this is ischemic related or infectious at this time. - Continue to monitor abdominal pain - Protonix 40mg  daily  HTN BP on admission 121/76. Home medication is Hyzaar 100-25mg  daily. Will hold home medications at this time and restart as appropriate. - Holding home Hyzaar  HLD Home medications include rosuvastatin 5mg . - Lipid panel ordered - Continue rosuvastatin  Depression/Anxiety Patient is currently on Bupropion 150mg  BID, initially prescribed for tobacco cessation, but assists with patient's anxiety and depression. This morning patient also had emotional upset related to something distressing she saw on the news. Feel that it may have contributed somewhat, but her atypical chest pain requires further work-up as noted above. - Continue to monitor mood - Continue home Bupropion   OSA - Continue home CPAP overnight  PVD No records available, but patient reports a history of LLE stenting. - Continue ASA, Statin  Tobacco use: Currently smokes about 2 cigarettes per day, is actively trying to quit. Previously smoked about 1PPD. Patient requested nicotine patch. Currently prescribed Bupropion 150mg  BID for cessation purposes and mood. - Nicoderm 7mg  daily - Continue home Bupropion    FEN/GI: Heart healthy  Prophylaxis: Lovenox  Disposition: Cardiac tele   History of Present Illness:  Samantha Luna is a 60 y.o. female presenting with left-sided chest pain that started this morning at 7 AM while she was cleaning her carpet.  She reports that it came on suddenly and worsened gradually until 8 AM at which time she  called her daughter and her daughter subsequently called EMS. The pain started under her left breast and extended  substernally and then into the left side of her neck.  She then began to experience abdominal cramping in her left upper quadrant.  Abdominal pain does not radiate to her back she took a dose of nitroglycerin which took her pain from a 9/10 to a 5/10.  She subsequently took ASA 324 mg and a second dose of nitroglycerin.  She denies ever having pain like this before.  She thought that perhaps it was gas pain at first but the duration and intensity of the symptoms scared her. Her pain has since resolved and she has been able to eat normally.  She denies a history of similar abdominal pain in the past.  She does have a history of constipation thought to be related to narcotic pain medicine that she takes for OA in her knees but this has not been an issue recently.  She had a colonoscopy in the last few years with a few polyps that were removed. She reports that she had an episode of emotional distress in response to a story that she is on the news and that she had to sit down and cry.  Her daughter reports that she does have a history of anxiety and depression and symptoms becomes quite upset and anxious at an emotionally charged new stories.  This happened shortly before the onset of her symptoms. Has been having chest pain on and all for a week that was coming and going.   Patient follows with cardiology after having an episode of syncope after working out in the yard years ago. She is in PT at this time because she is getting ready go have knee surgery.   Review Of Systems: Per HPI with the following additions:   Review of Systems  Constitutional:  Negative for chills, fatigue and fever.  HENT:  Negative for congestion, rhinorrhea and sore throat.   Respiratory:  Positive for shortness of breath.   Cardiovascular:  Positive for chest pain.  Gastrointestinal:  Positive for abdominal pain. Negative for diarrhea, nausea and vomiting.  Genitourinary:  Negative for decreased urine volume, difficulty  urinating and frequency.  Neurological:  Positive for dizziness and weakness.    Patient Active Problem List   Diagnosis Date Noted   Chest pain 08/17/2021   Primary osteoarthritis of left knee 05/17/2021    Past Medical History: Past Medical History:  Diagnosis Date   CHF (congestive heart failure) ([place])    Hypertension     Past Surgical History: History reviewed. No pertinent surgical history.  Social History: Social History   Tobacco Use   Smoking status: Every Day    Types: Cigarettes   Smokeless tobacco: Never  Vaping Use   Vaping Use: Never used  Substance Use Topics   Alcohol use: Yes    Comment: wine occ   Drug use: Never    Patient used to smoke a pack of cigarettes per day and is now down to 2 cigarettes per day. She has been doing 2 cigarettes for around a month and would like a nicotine patch. Also takes wellbutrin to help with her emotions and her cessation efforts  Does not drink regularly, has a drink maybe 1-2 times per month. Marijuana every now and then  Family History: Grandmother died in 2000-11-21 of heart disease   Allergies and Medications: Allergies  Allergen Reactions   Codeine    Iodine  No current facility-administered medications on file prior to encounter.   Current Outpatient Medications on File Prior to Encounter  Medication Sig Dispense Refill   albuterol (VENTOLIN HFA) 108 (90 Base) MCG/ACT inhaler Inhale into the lungs every 6 (six) hours as needed for wheezing or shortness of breath.     aspirin EC 81 MG tablet Take 325 mg by mouth once. Swallow whole.     buPROPion (WELLBUTRIN XL) 150 MG 24 hr tablet Take 150 mg by mouth 2 (two) times daily.     diclofenac (VOLTAREN) 75 MG EC tablet Take 1 tablet (75 mg total) by mouth 2 (two) times daily. 60 tablet 1   fluticasone (FLOVENT HFA) 110 MCG/ACT inhaler Inhale into the lungs 2 (two) times daily.     ibandronate (BONIVA) 150 MG tablet Take 150 mg by mouth every 30 (thirty) days.      losartan-hydrochlorothiazide (HYZAAR) 100-25 MG tablet Take 1 tablet by mouth daily.     Multiple Vitamins-Minerals (MULTIVITAMIN WITH MINERALS) tablet Take 1 tablet by mouth daily.     naproxen (NAPROSYN) 250 MG tablet Take 250 mg by mouth as needed (takes with prilosec).     nitroGLYCERIN (NITROSTAT) 0.4 MG SL tablet Place 0.4 mg under the tongue every 5 (five) minutes as needed for chest pain.     omeprazole (PRILOSEC) 20 MG capsule Take 20 mg by mouth daily as needed (with naproxen).     rosuvastatin (CRESTOR) 5 MG tablet Take 5 mg by mouth daily.     Vitamin D, Ergocalciferol, (DRISDOL) 1.25 MG (50000 UNIT) CAPS capsule Take 50,000 Units by mouth once a week.      Objective: BP 111/63    Pulse 80    Temp 98.5 F (36.9 C) (Oral)    Resp 18    Ht 5' (1.524 m)    Wt 86.2 kg    SpO2 96%    BMI 37.11 kg/m  Exam: General: Alert, sitting up in bed, NAD, conversational and interactive Eyes: EOM's are intact ENTM: Mucous membranes are moist, oropharynx is clear Neck: Without JVD Cardiovascular: Regular rate, regular rhythm, without murmur/rub/gallop Respiratory: Lungs are clear to auscultation bilaterally without wheezes, rales, rhonchi Gastrointestinal: Abdomen is soft and nontender, without organomegaly MSK: Without edema or deformity Derm: Warm and dry, no rash or excoriation Neuro: Without focal deficit Psych: Mood and affect are normal  Labs and Imaging: CBC BMET  Recent Labs  Lab 08/17/21 0920  WBC 5.7  HGB 13.6  HCT 41.3  PLT 315   Recent Labs  Lab 08/17/21 0920  NA 137  K 3.6  CL 107  CO2 22  BUN 13  CREATININE 0.70  GLUCOSE 88  CALCIUM 9.7     EKG: Normal Sinus Rhythm, q waves noted in lead III, no ST elev/depr, QTc 420  CT ABDOMEN PELVIS WO CONTRAST CLINICAL DATA:  Abdominal pain  EXAM: CT ABDOMEN AND PELVIS WITHOUT CONTRAST  TECHNIQUE: Multidetector CT imaging of the abdomen and pelvis was performed following the standard protocol without IV  contrast.  COMPARISON:  None.  FINDINGS: Lower chest: No acute abnormality.  Hepatobiliary: No focal liver abnormality is seen. No gallstones, gallbladder wall thickening, or biliary dilatation.  Pancreas: Unremarkable. No pancreatic ductal dilatation or surrounding inflammatory changes.  Spleen: Normal in size without focal abnormality.  Adrenals/Urinary Tract: Low attenuation lesion of the right adrenal gland measuring 1.2 cm, likely an adenoma. No hydronephrosis or nephrolithiasis. Bladder is unremarkable.  Stomach/Bowel: Stomach is within normal limits. Appendix is  surgically absent. No evidence of bowel wall thickening, distention, or inflammatory changes.  Vascular/Lymphatic: Aortic atherosclerosis. No enlarged abdominal or pelvic lymph nodes. Venous stent seen in the infrarenal IVC and bilateral common/external iliac veins.  Reproductive: Uterus and bilateral adnexa are unremarkable.  Other: No abdominal wall hernia or abnormality. No abdominopelvic ascites.  Musculoskeletal: No acute or significant osseous findings.  IMPRESSION: 1. No acute findings in the abdomen or pelvis. 2.  Aortic Atherosclerosis (ICD10-I70.0).  Electronically Signed   By: Yetta Glassman M.D.   On: 08/17/2021 16:09 DG Chest 2 View CLINICAL DATA:  Chest pain, shortness of breath  EXAM: CHEST - 2 VIEW  COMPARISON:  None.  FINDINGS: The heart size and mediastinal contours are within normal limits. Both lungs are clear. The visualized skeletal structures are unremarkable.  IMPRESSION: No active cardiopulmonary disease.  Electronically Signed   By: Keane Police D.O.   On: 08/17/2021 09:30    Eppie Gibson, MD 08/17/2021, 6:15 PM PGY-1, [facility] pager: [phone], text pages welcome

## 2021-08-17 NOTE — Progress Notes (Signed)
Planning for CCTA tomorrow 08/18/21 at 10:00am.  Pt has contrast allergy, 13hr prep ordered Prednisone to start tonight at 21:00, 0300 and 0900 Benadryl to be given tomorrow 1 hr prior to scan 0900 50mg  metoprolol tartrate for HR tomorrow at 0800  Pt will need an 18g PIV in the [facility] for contrast. Please keep patient NPO for 4 hr prior to scan (starting at 0600) sips of water and ice chips okay.  Marchia Bond RN Navigator Cardiac Imaging [facility] [phone] Office  [phone] Cell

## 2021-08-17 NOTE — ED Triage Notes (Signed)
EMS reports left sided CP starting at 8 this morning when cleaning her carpets. Pain was 9/10, pt took 2 nitro and pain reduced to a 4. Pain worse with deep inhalation. 324 ASA given.  174/94 22G L hand  Pt reports pain is gone at this time. Endorses mild SOB.

## 2021-08-17 NOTE — ED Provider Notes (Signed)
[facility] EMERGENCY DEPARTMENT Provider Note   CSN: [id number] Arrival date & time: 08/17/21  5366     History Chief Complaint  Patient presents with   Chest Pain    Samantha Luna is a 60 y.o. female.   Chest Pain Associated symptoms: diaphoresis, nausea and shortness of breath   Associated symptoms: no abdominal pain, no back pain, no cough, no dizziness, no fatigue, no fever, no headache, no numbness, no palpitations, no vomiting and no weakness   Patient presents for cute onset of chest pain.  Chest pain occurred shortly prior to arrival this morning.  At the time, she was at rest, watching TV.  Pain is located in the left side of her chest and radiates to the left side of her neck.  She had associated shortness of breath and nausea.  She states that she did feel diaphoretic at one point.  Severity of pain improved with nitroglycerin.  She received 324 of ASA prior to arrival.  Patient has no known history of ACS.  She moved to the local area in June.  Prior to that, she was seen by cardiology.  She states that she had a stress test approximately 1 year ago and was told that the results were normal.  She has had previous echocardiograms which she was also told were normal.  She does have a history of hypertension, hyperlipidemia, smoking, PAD, and prediabetes.  Currently, patient endorses 4/10 severity chest pain in addition to mild nausea.  She denies any current symptoms at this time HPI: A 60 year old patient with a history of peripheral artery disease, hypertension, hypercholesterolemia and obesity presents for evaluation of chest pain. Initial onset of pain was less than one hour ago. The patient's chest pain is sharp, is not worse with exertion and is relieved by nitroglycerin. The patient complains of nausea and reports some diaphoresis. The patient's chest pain is middle- or left-sided, is not well-localized, is not described as heaviness/pressure/tightness and does  radiate to the arms/jaw/neck. The patient has smoked in the past 90 days. The patient has no history of stroke, denies any history of treated diabetes and has no relevant family history of coronary artery disease (first degree relative at less than age 26).   Past Medical History:  Diagnosis Date   CHF (congestive heart failure) ([place])    Hypertension     Patient Active Problem List   Diagnosis Date Noted   Chest pain 08/17/2021   Primary osteoarthritis of left knee 05/17/2021    History reviewed. No pertinent surgical history.   OB History   No obstetric history on file.     No family history on file.  Social History   Tobacco Use   Smoking status: Every Day    Types: Cigarettes   Smokeless tobacco: Never  Vaping Use   Vaping Use: Never used  Substance Use Topics   Alcohol use: Yes    Comment: wine occ   Drug use: Never    Home Medications Prior to Admission medications   Medication Sig Start Date End Date Taking? Authorizing Provider  albuterol (VENTOLIN HFA) 108 (90 Base) MCG/ACT inhaler Inhale into the lungs every 6 (six) hours as needed for wheezing or shortness of breath.   Yes [provider]  aspirin EC 81 MG tablet Take 325 mg by mouth once. Swallow whole.   Yes [provider]  buPROPion (WELLBUTRIN XL) 150 MG 24 hr tablet Take 150 mg by mouth 2 (two) times daily.  Yes [provider]  diclofenac (VOLTAREN) 75 MG EC tablet Take 1 tablet (75 mg total) by mouth 2 (two) times daily. 06/10/21  Yes Rosemarie Ax, MD  fluticasone (FLOVENT HFA) 110 MCG/ACT inhaler Inhale into the lungs 2 (two) times daily.   Yes [provider]  ibandronate (BONIVA) 150 MG tablet Take 150 mg by mouth every 30 (thirty) days.   Yes [provider]  losartan-hydrochlorothiazide (HYZAAR) 100-25 MG tablet Take 1 tablet by mouth daily.   Yes [provider]  Multiple Vitamins-Minerals (MULTIVITAMIN WITH MINERALS) tablet Take 1  tablet by mouth daily.   Yes [provider]  naproxen (NAPROSYN) 250 MG tablet Take 250 mg by mouth as needed (takes with prilosec).   Yes [provider]  nitroGLYCERIN (NITROSTAT) 0.4 MG SL tablet Place 0.4 mg under the tongue every 5 (five) minutes as needed for chest pain.   Yes [provider]  omeprazole (PRILOSEC) 20 MG capsule Take 20 mg by mouth daily as needed (with naproxen). 11/05/20  Yes [provider]  rosuvastatin (CRESTOR) 5 MG tablet Take 5 mg by mouth daily.   Yes [provider]  Vitamin D, Ergocalciferol, (DRISDOL) 1.25 MG (50000 UNIT) CAPS capsule Take 50,000 Units by mouth once a week. 03/09/21  Yes [provider]    Allergies    Codeine and Iodine  Review of Systems   Review of Systems  Constitutional:  Positive for diaphoresis. Negative for activity change, appetite change, chills, fatigue and fever.  HENT:  Negative for congestion, ear pain and sore throat.   Eyes:  Negative for pain and visual disturbance.  Respiratory:  Positive for shortness of breath. Negative for cough and chest tightness.   Cardiovascular:  Positive for chest pain. Negative for palpitations and leg swelling.  Gastrointestinal:  Positive for nausea. Negative for abdominal pain, diarrhea and vomiting.  Genitourinary:  Negative for dysuria, flank pain, hematuria and pelvic pain.  Musculoskeletal:  Positive for neck pain. Negative for arthralgias, back pain, gait problem, joint swelling and myalgias.  Skin:  Negative for color change and rash.  Neurological:  Negative for dizziness, seizures, syncope, weakness, light-headedness, numbness and headaches.  Hematological:  Does not bruise/bleed easily.  Psychiatric/Behavioral:  Negative for confusion and decreased concentration.   All other systems reviewed and are negative.  Physical Exam Updated Vital Signs BP (!) 104/48 (BP Location: Left Arm)    Pulse (!) 102    Temp 98 F (36.7 C) (Oral)     Resp 16    Ht 5' (1.524 m)    Wt 88.4 kg    SpO2 100%    BMI 38.06 kg/m   Physical Exam Vitals and nursing note reviewed.  Constitutional:      General: She is not in acute distress.    Appearance: She is well-developed. She is not ill-appearing, toxic-appearing or diaphoretic.  HENT:     Head: Normocephalic and atraumatic.  Eyes:     Conjunctiva/sclera: Conjunctivae normal.  Neck:     Vascular: No JVD.  Cardiovascular:     Rate and Rhythm: Normal rate and regular rhythm.     Heart sounds: No murmur heard.   No friction rub.  Pulmonary:     Effort: Pulmonary effort is normal. No respiratory distress.     Breath sounds: Normal breath sounds. No decreased breath sounds, wheezing, rhonchi or rales.  Chest:     Chest wall: No tenderness.  Abdominal:     Palpations: Abdomen is  soft.     Tenderness: There is no abdominal tenderness.  Musculoskeletal:        General: No swelling.     Cervical back: Normal range of motion and neck supple.     Right lower leg: No edema.     Left lower leg: No edema.  Skin:    General: Skin is warm and dry.     Capillary Refill: Capillary refill takes less than 2 seconds.  Neurological:     General: No focal deficit present.     Mental Status: She is alert and oriented to person, place, and time.     Cranial Nerves: No cranial nerve deficit.     Motor: No weakness.  Psychiatric:        Mood and Affect: Mood normal.        Behavior: Behavior normal.    ED Results / Procedures / Treatments   Labs (all labs ordered are listed, but only abnormal results are displayed) Labs Reviewed  RESP PANEL BY RT-PCR (FLU A&B, COVID) ARPGX2  BASIC METABOLIC PANEL  CBC  MAGNESIUM  HIV ANTIBODY (ROUTINE TESTING W REFLEX)  BASIC METABOLIC PANEL  HEMOGLOBIN A1C  LIPID PANEL  TROPONIN I (HIGH SENSITIVITY)  TROPONIN I (HIGH SENSITIVITY)    EKG EKG Interpretation  Date/Time:  Wednesday August 17 2021 09:14:45 EST Ventricular Rate:  76 PR  Interval:  154 QRS Duration: 74 QT Interval:  374 QTC Calculation: 420 R Axis:   26 Text Interpretation: Normal sinus rhythm Cannot rule out Anterior infarct , age undetermined Abnormal ECG Confirmed by Godfrey Pick [phone]) on 08/17/2021 11:31:47 AM  Radiology CT ABDOMEN PELVIS WO CONTRAST  Result Date: 08/17/2021 CLINICAL DATA:  Abdominal pain EXAM: CT ABDOMEN AND PELVIS WITHOUT CONTRAST TECHNIQUE: Multidetector CT imaging of the abdomen and pelvis was performed following the standard protocol without IV contrast. COMPARISON:  None. FINDINGS: Lower chest: No acute abnormality. Hepatobiliary: No focal liver abnormality is seen. No gallstones, gallbladder wall thickening, or biliary dilatation. Pancreas: Unremarkable. No pancreatic ductal dilatation or surrounding inflammatory changes. Spleen: Normal in size without focal abnormality. Adrenals/Urinary Tract: Low attenuation lesion of the right adrenal gland measuring 1.2 cm, likely an adenoma. No hydronephrosis or nephrolithiasis. Bladder is unremarkable. Stomach/Bowel: Stomach is within normal limits. Appendix is surgically absent. No evidence of bowel wall thickening, distention, or inflammatory changes. Vascular/Lymphatic: Aortic atherosclerosis. No enlarged abdominal or pelvic lymph nodes. Venous stent seen in the infrarenal IVC and bilateral common/external iliac veins. Reproductive: Uterus and bilateral adnexa are unremarkable. Other: No abdominal wall hernia or abnormality. No abdominopelvic ascites. Musculoskeletal: No acute or significant osseous findings. IMPRESSION: 1. No acute findings in the abdomen or pelvis. 2.  Aortic Atherosclerosis (ICD10-I70.0). Electronically Signed   By: Yetta Glassman M.D.   On: 08/17/2021 16:09   DG Chest 2 View  Result Date: 08/17/2021 CLINICAL DATA:  Chest pain, shortness of breath EXAM: CHEST - 2 VIEW COMPARISON:  None. FINDINGS: The heart size and mediastinal contours are within normal limits. Both lungs are  clear. The visualized skeletal structures are unremarkable. IMPRESSION: No active cardiopulmonary disease. Electronically Signed   By: Keane Police D.O.   On: 08/17/2021 09:30    Procedures Procedures   Medications Ordered in ED Medications  predniSONE (DELTASONE) tablet 50 mg (has no administration in time range)  diphenhydrAMINE (BENADRYL) capsule 50 mg (has no administration in time range)  metoprolol tartrate (LOPRESSOR) tablet 50 mg (has no administration in time range)  buPROPion (WELLBUTRIN XL) 24 hr  tablet 150 mg (has no administration in time range)  pantoprazole (PROTONIX) EC tablet 40 mg (has no administration in time range)  rosuvastatin (CRESTOR) tablet 5 mg (has no administration in time range)  enoxaparin (LOVENOX) injection 40 mg (has no administration in time range)  acetaminophen (TYLENOL) tablet 650 mg (has no administration in time range)    Or  acetaminophen (TYLENOL) suppository 650 mg (has no administration in time range)  nicotine (NICODERM CQ - dosed in mg/24 hr) patch 7 mg (has no administration in time range)  aspirin EC tablet 81 mg (has no administration in time range)  budesonide (PULMICORT) nebulizer solution 0.25 mg (0.25 mg Nebulization Given 08/17/21 1918)  nitroGLYCERIN (NITROGLYN) 2 % ointment 1 inch (1 inch Topical Given 08/17/21 1229)  morphine 4 MG/ML injection 4 mg (4 mg Intravenous Given 08/17/21 1226)  ondansetron (ZOFRAN) injection 4 mg (4 mg Intravenous Given 08/17/21 1225)    ED Course  I have reviewed the triage vital signs and the nursing notes.  Pertinent labs & imaging results that were available during my care of the patient were reviewed by me and considered in my medical decision making (see chart for details).  Clinical Course as of 08/17/21 1957  Wed Aug 17, 2021  1629 HEART 5, chest pain to neck. Cardio has evaluated, recommends med admit. Ct coronary in AM. CP reduced. Belly CT neg [SG]    Clinical Course User Index [SG] Jeanell Sparrow, DO   MDM Rules/Calculators/A&P HEAR Score: 5                        Patient with no history of ACS presenting for cute onset of chest pain this morning.  Pain did improve with nitroglycerin.  On arrival, she endorses 4/10 severity chest pain.  Vital signs on arrival are normal.  Patient is well-appearing.  EKG shows nonspecific T wave abnormalities.  Laboratory work-up was initiated.  Prior to arrival, patient did receive 324 of ASA and 2 NTG's.  Nitroglycerin ointment and morphine ordered for continued analgesia.  Zofran was given for symptomatic relief of nausea.  Patient had continued improvement in chest pain.  Initial troponin was normal.  Based on EKG findings, risk factors, and symptoms, patient has a heart score of 5.  Cardiology was consulted.  Cardiology evaluated the patient in the ED and did recommend a medicine admission.  Cardiology plans on coronary CT scan in the morning.  They stated concern of what the patient described as abdominal pain.  CT scan of abdomen and pelvis was ordered without any acute findings.  Patient was admitted to medicine for further management.  Final Clinical Impression(s) / ED Diagnoses Final diagnoses:  Chest pain, unspecified type    Rx / DC Orders ED Discharge Orders     None        Godfrey Pick, MD 08/17/21 2001

## 2021-08-18 ENCOUNTER — Other Ambulatory Visit (HOSPITAL_COMMUNITY): Payer: Self-pay

## 2021-08-18 ENCOUNTER — Encounter (HOSPITAL_COMMUNITY): Payer: Self-pay | Admitting: Family Medicine

## 2021-08-18 ENCOUNTER — Observation Stay (HOSPITAL_COMMUNITY): Payer: Medicare HMO

## 2021-08-18 DIAGNOSIS — J439 Emphysema, unspecified: Secondary | ICD-10-CM

## 2021-08-18 DIAGNOSIS — G4733 Obstructive sleep apnea (adult) (pediatric): Secondary | ICD-10-CM | POA: Diagnosis not present

## 2021-08-18 DIAGNOSIS — E7800 Pure hypercholesterolemia, unspecified: Secondary | ICD-10-CM

## 2021-08-18 DIAGNOSIS — R072 Precordial pain: Secondary | ICD-10-CM | POA: Diagnosis not present

## 2021-08-18 DIAGNOSIS — Z6837 Body mass index (BMI) 37.0-37.9, adult: Secondary | ICD-10-CM | POA: Diagnosis not present

## 2021-08-18 DIAGNOSIS — R079 Chest pain, unspecified: Secondary | ICD-10-CM | POA: Diagnosis not present

## 2021-08-18 DIAGNOSIS — M1712 Unilateral primary osteoarthritis, left knee: Secondary | ICD-10-CM | POA: Diagnosis not present

## 2021-08-18 DIAGNOSIS — I10 Essential (primary) hypertension: Secondary | ICD-10-CM | POA: Diagnosis not present

## 2021-08-18 DIAGNOSIS — E785 Hyperlipidemia, unspecified: Secondary | ICD-10-CM | POA: Diagnosis not present

## 2021-08-18 DIAGNOSIS — F4321 Adjustment disorder with depressed mood: Secondary | ICD-10-CM | POA: Insufficient documentation

## 2021-08-18 DIAGNOSIS — Z720 Tobacco use: Secondary | ICD-10-CM

## 2021-08-18 DIAGNOSIS — R413 Other amnesia: Secondary | ICD-10-CM | POA: Insufficient documentation

## 2021-08-18 HISTORY — DX: Obstructive sleep apnea (adult) (pediatric): G47.33

## 2021-08-18 HISTORY — DX: Hyperlipidemia, unspecified: E78.5

## 2021-08-18 LAB — LIPID PANEL
Cholesterol: 143 mg/dL (ref 0–200)
HDL: 39 mg/dL — ABNORMAL LOW (ref >40)
LDL Cholesterol: 93 mg/dL (ref 0–99)
Total CHOL/HDL Ratio: 3.7 RATIO
Triglycerides: 55 mg/dL (ref <150)
VLDL: 11 mg/dL (ref 0–40)

## 2021-08-18 LAB — BASIC METABOLIC PANEL
Anion gap: 10 (ref 5–15)
BUN: 16 mg/dL (ref 8–23)
CO2: 19 mmol/L — ABNORMAL LOW (ref 22–32)
Calcium: 9.4 mg/dL (ref 8.9–10.3)
Chloride: 106 mmol/L (ref 98–111)
Creatinine, Ser: 0.88 mg/dL (ref 0.44–1.00)
GFR, Estimated: >60 mL/min (ref >60)
Glucose, Bld: 149 mg/dL — ABNORMAL HIGH (ref 70–99)
Potassium: 3.9 mmol/L (ref 3.5–5.1)
Sodium: 135 mmol/L (ref 135–145)

## 2021-08-18 LAB — HEMOGLOBIN A1C
Hgb A1c MFr Bld: 5.7 % — ABNORMAL HIGH (ref 4.8–5.6)
Mean Plasma Glucose: 116.89 mg/dL

## 2021-08-18 MED ORDER — NITROGLYCERIN 0.4 MG SL SUBL
SUBLINGUAL_TABLET | SUBLINGUAL | Status: AC
  Administered 2021-08-18: 11:00:00 0.8 mg
  Filled 2021-08-18: qty 2

## 2021-08-18 MED ORDER — NICOTINE 7 MG/24HR TD PT24
7.0000 mg | MEDICATED_PATCH | Freq: Every day | TRANSDERMAL | 0 refills | Status: DC
  Filled 2021-08-18: qty 28, 28d supply, fill #0

## 2021-08-18 MED ORDER — METOPROLOL TARTRATE 5 MG/5ML IV SOLN
INTRAVENOUS | Status: AC
  Administered 2021-08-18: 11:00:00 5 mg
  Filled 2021-08-18: qty 5

## 2021-08-18 MED ORDER — ASPIRIN 81 MG PO TBEC
81.0000 mg | DELAYED_RELEASE_TABLET | Freq: Every day | ORAL | 11 refills | Status: DC
  Filled 2021-08-18: qty 30, 30d supply, fill #0

## 2021-08-18 MED ORDER — IOHEXOL 350 MG/ML SOLN
100.0000 mL | Freq: Once | INTRAVENOUS | Status: AC | PRN
  Administered 2021-08-18: 11:00:00 100 mL via INTRAVENOUS

## 2021-08-18 NOTE — Progress Notes (Signed)
Progress Note  Patient Name: Samantha Luna Date of Encounter: 08/18/2021  Primary Cardiologist:   None   Subjective   Still feeling some sharp pain under the left breast at times. No SOB.    Inpatient Medications    Scheduled Meds:  aspirin EC  81 mg Oral Daily   budesonide (PULMICORT) nebulizer solution  0.25 mg Nebulization BID   buPROPion  150 mg Oral BID   diphenhydrAMINE  50 mg Oral Once   enoxaparin (LOVENOX) injection  40 mg Subcutaneous Q24H   metoprolol tartrate  100 mg Oral Once   nicotine  7 mg Transdermal Daily   pantoprazole  40 mg Oral Daily   predniSONE  50 mg Oral Q6H   rosuvastatin  5 mg Oral Daily   Continuous Infusions:  PRN Meds: acetaminophen **OR** acetaminophen   Vital Signs    Vitals:   08/17/21 2114 08/17/21 2123 08/18/21 0141 08/18/21 0501  BP: (!) 91/42  (!) 99/47 (!) 121/58  Pulse: 97 97 78 (!) 110  Resp: 16 16 16 16   Temp: 98.8 F (37.1 C)  98.6 F (37 C) 98.7 F (37.1 C)  TempSrc: Oral  Oral Oral  SpO2: 100% 100% 100%   Weight:    88.2 kg  Height:        Intake/Output Summary (Last 24 hours) at 08/18/2021 0823 Last data filed at 08/18/2021 0300 Gross per 24 hour  Intake 240 ml  Output --  Net 240 ml   Filed Weights   08/17/21 0910 08/17/21 1819 08/18/21 0501  Weight: 86.2 kg 88.4 kg 88.2 kg     ECG    NA - Personally Reviewed  Physical Exam   GEN: No acute distress.   Neck: No  JVD Cardiac: RRR, no murmurs, rubs, or gallops.  Respiratory: Clear  to auscultation bilaterally. GI: Soft, nontender, non-distended  MS: No  edema; No deformity. Neuro:  Nonfocal  Psych: Normal affect   Labs    Chemistry Recent Labs  Lab 08/17/21 0920 08/18/21 0244  NA 137 135  K 3.6 3.9  CL 107 106  CO2 22 19*  GLUCOSE 88 149*  BUN 13 16  CREATININE 0.70 0.88  CALCIUM 9.7 9.4  GFRNONAA >60 >60  ANIONGAP 8 10     Hematology Recent Labs  Lab 08/17/21 0920  WBC 5.7  RBC 4.56  HGB 13.6  HCT 41.3  MCV 90.6  MCH  29.8  MCHC 32.9  RDW 13.2  PLT 315    Cardiac EnzymesNo results for input(s): TROPONINI in the last 168 hours. No results for input(s): TROPIPOC in the last 168 hours.   BNPNo results for input(s): BNP, PROBNP in the last 168 hours.   DDimer No results for input(s): DDIMER in the last 168 hours.   Radiology    CT ABDOMEN PELVIS WO CONTRAST  Result Date: 08/17/2021 CLINICAL DATA:  Abdominal pain EXAM: CT ABDOMEN AND PELVIS WITHOUT CONTRAST TECHNIQUE: Multidetector CT imaging of the abdomen and pelvis was performed following the standard protocol without IV contrast. COMPARISON:  None. FINDINGS: Lower chest: No acute abnormality. Hepatobiliary: No focal liver abnormality is seen. No gallstones, gallbladder wall thickening, or biliary dilatation. Pancreas: Unremarkable. No pancreatic ductal dilatation or surrounding inflammatory changes. Spleen: Normal in size without focal abnormality. Adrenals/Urinary Tract: Low attenuation lesion of the right adrenal gland measuring 1.2 cm, likely an adenoma. No hydronephrosis or nephrolithiasis. Bladder is unremarkable. Stomach/Bowel: Stomach is within normal limits. Appendix is surgically absent. No evidence of bowel  wall thickening, distention, or inflammatory changes. Vascular/Lymphatic: Aortic atherosclerosis. No enlarged abdominal or pelvic lymph nodes. Venous stent seen in the infrarenal IVC and bilateral common/external iliac veins. Reproductive: Uterus and bilateral adnexa are unremarkable. Other: No abdominal wall hernia or abnormality. No abdominopelvic ascites. Musculoskeletal: No acute or significant osseous findings. IMPRESSION: 1. No acute findings in the abdomen or pelvis. 2.  Aortic Atherosclerosis (ICD10-I70.0). Electronically Signed   By: Yetta Glassman M.D.   On: 08/17/2021 16:09   DG Chest 2 View  Result Date: 08/17/2021 CLINICAL DATA:  Chest pain, shortness of breath EXAM: CHEST - 2 VIEW COMPARISON:  None. FINDINGS: The heart size and  mediastinal contours are within normal limits. Both lungs are clear. The visualized skeletal structures are unremarkable. IMPRESSION: No active cardiopulmonary disease. Electronically Signed   By: Keane Police D.O.   On: 08/17/2021 09:30    Cardiac Studies   NA  Patient Profile     60 y.o. female  with HTN, HLD, OSA on Cpap, PVD and tobacco use who is being seen 08/17/2021 for the evaluation of chest pain.    Assessment & Plan    Chest pain:  No evidence of CAD or calcium on CT.  No further cardiac work up.   No further cardiac work up.     HTN:   BP is actually running low. Discontinued NTG paste. On Hyzaar PTA.  I will defer to the primary team whether they want to restart her home BP meds at perhaps a reduced dose.    HLD: On Crestor. I would suggest increase to 10 mg PO daily with a goal LDL 70.     OSA:  On CPAP.    No cardiology follow up is needed.    For questions or updates, please contact [place] Please consult [email or web address] for contact info under Cardiology/STEMI.   Signed, Minus Breeding, MD  08/18/2021, 8:23 AM

## 2021-08-18 NOTE — Progress Notes (Addendum)
Family Medicine Teaching Service Daily Progress Note Intern Pager: [phone]  Patient name: Samantha Luna Medical record number: [id number] Date of birth: 1961-01-20 Age: 60 y.o. Gender: female  Primary Care Provider: Wendie Simmer, MD Consultants: Cardiology Code Status: Full  Pt Overview and Major Events to Date:  08/17/21: Admit to FPTS   Assessment and Plan:  Samantha Luna is a 60 y.o. female presenting with chest pain and shortness of breath. PMH is significant for CHF, HLD, OSA, asthma, tobacco use, COPD, h/o meningioma (2011), anxiety/depression, HTN, knee OA with chronic pain medications.    Chest Pain   ACS r/o  Pt is scheduled for CTA this morning and watch overnight given multiple risk factors. Chest pain this morning has improved, still feels intermittent twinges of pain. Cardiology is consulted and following. HA1C 5.7 and lipid panel wnl.  -- Cardiac tele monitoring  -- CTA coronary today @10  -- Tylenol as needed for pain  Intermittent Abdominal Pain  Abdominal pain presented soon after her chest pain.  CT abdomen pelvis showed no acute findings in the abdomen and pelvis.  Given that the pain has resolved, not likely ischemic or infectious. Abdominal exam unremarkable for peritoneal signs.  -- serial abdominal exams - Continue to monitor pain - Protonix 40 mg daily  HTN  BP range today: 90s-120s/40s-70s.  Patient has home medication of Hyzaar 100-25 mg daily. Creatinine 0.88. Holding Hyzaar. -- Continue to hold Hyzaar  -- VS per protocol   HLD Total 143, Triglycerides 55, HDL 39, LCL 93. Home medication: Rosuvastatin 5 mg - Lipid panel as above - Continue rosuvastatin  Depression/anxiety   Tobacco use Patient is currently on bupropion 150 mg twice daily.  Initially was given for tobacco cessation but has been helping with anxiety and depression.  -- Monitor mood  -- Continue home Bupropion   OSA -- CPAP overnight   PVD Remote h/o of LLE stenting, no medical  records available  -- ASA and statin   FEN/GI: Regular  PPx: Lovenox  Dispo:Home  1-2 days . Barriers include CTA findings.   Subjective:  Pt is doing well this morning with intermittent twinges of chest pain. Abdominal pain has dissipated. No accompanying N/V.  Objective: Temp:  [98 F (36.7 C)-98.8 F (37.1 C)] 98.6 F (37 C) (12/22 0848) Pulse Rate:  [78-110] 92 (12/22 0848) Resp:  [16-18] 17 (12/22 0848) BP: (91-125)/(42-99) 116/99 (12/22 0848) SpO2:  [96 %-100 %] 99 % (12/22 0848) Weight:  [88.2 kg-88.4 kg] 88.2 kg (12/22 0501) Physical Exam: General: NAD, sitting up in bed  Cardiovascular: RRR Respiratory: Normal WOB, CTAB  Abdomen: Nondistended, soft, mild tenderness to deep palpation of the epigastric region Extremities: FROM  Laboratory: Recent Labs  Lab 08/17/21 0920  WBC 5.7  HGB 13.6  HCT 41.3  PLT 315   Recent Labs  Lab 08/17/21 0920 08/18/21 0244  NA 137 135  K 3.6 3.9  CL 107 106  CO2 22 19*  BUN 13 16  CREATININE 0.70 0.88  CALCIUM 9.7 9.4  GLUCOSE 88 149*      Imaging/Diagnostic Tests: No new   Erskine Emery, MD 08/18/2021, 12:00 PM PGY-1, [place] Intern pager: [phone], text pages welcome

## 2021-08-18 NOTE — Progress Notes (Signed)
FPTS Brief Progress Note  S: Samantha Luna was seen this p.m. sleeping comfortably.  Did not awaken patient.  O: BP (!) 99/47 (BP Location: Left Arm)    Pulse 78    Temp 98.6 F (37 C) (Oral)    Resp 16    Ht 5' (1.524 m)    Wt 88.4 kg    SpO2 100%    BMI 38.06 kg/m   Physical Exam Vitals and nursing note reviewed.  Constitutional:      General: She is sleeping. She is not in acute distress.    Appearance: She is not ill-appearing, toxic-appearing or diaphoretic.  Cardiovascular:     Rate and Rhythm: Normal rate and regular rhythm.  Pulmonary:     Effort: Pulmonary effort is normal. No tachypnea, bradypnea or respiratory distress.    A/P: Atypical chest pain Cardiology following, appreciate assistance and recommendations Telemetry overnight Plans for coronary CT on 12/22 Prednisone and Benadryl prior to imaging for allergies Follow-up A1c and lipid panel Orders reviewed. Labs for AM ordered, which was adjusted as needed.   Remainder of plan per day team/daily progress note.  Samantha Brittle, DO 08/18/2021, 2:35 AM PGY-1, [place] [facility] Night Resident  Please page [phone] with questions.

## 2021-08-18 NOTE — Discharge Summary (Signed)
[facility] Discharge Summary  Patient name: Samantha Luna Medical record number: [id number] Date of birth: [birth year] Age: 60 y.o. Gender: female Date of Admission: 08/17/2021  Date of Discharge: 08/18/21 Admitting Physician: Lenoria Chime, MD  Primary Care Provider: Wendie Simmer, MD Consultants: Cardiology   Indication for Hospitalization: Chest pain   Discharge Diagnoses/Problem List:  Principal Problem:   Precordial pain Active Problems:   Primary osteoarthritis of left knee   Hypertension   High cholesterol   Morbid obesity ([place])   BMI 37.0-37.9, adult   Obstructive sleep apnea   COPD (chronic obstructive pulmonary disease) ([place])   Tobacco use    Disposition: Home   Discharge Condition: Stable   Discharge Exam:  Blood pressure 113/77, pulse 72, temperature 97.7 F (36.5 C), temperature source Oral, resp. rate 16, height 5' (1.524 m), weight 88.2 kg, SpO2 99 %. Physical Exam: General: NAD, sitting up in bed  Cardiovascular: RRR Respiratory: Normal WOB, CTAB  Abdomen: Nondistended, soft, mild tenderness to deep palpation of the epigastric region Extremities: FROM  Brief Hospital Course:  Analyn Matusek is a 60 yo F admitted to [facility] for SOB and fever, was found to have staph bacteremia. PMHx significant for CHF, HLD, OSA, asthma, tobacco use, COPD, h/o meningioma (2011), anxiety/depression, HTN, knee OA with chronic pain medications.    Chest Pain   ACS r/o  Patient initially presented to the emergency room with chest pain and accompanying abdominal pain.  EKG showed no acute changes as well as chest x-ray showing no active cardiopulmonary disease.  However, cardiology evaluated the patient and felt that due to multiple risk factors (hypertension, hyperlipidemia, PVD, tobacco use), it was warranted to observe overnight and continue with coronary CTA.  Patient did have good response from sublingual nitroglycerin.  HA1C 5.7 and lipid panel  wnl. CTA showed no evidence of CAD as cause of chest pain.   Abdominal pain Abdominal pain resolved soon after presenting to the emergency room.  CT abdomen and pelvis showed no acute findings.  Abdominal exams remained unremarkable.  Patient was continued on Protonix 40 mg daily for GERD.  Hypertension Patient continued to have blood pressures within the normotensive range.  Hyzaar was held during her admission.  Hyperlipidemia Lipid Panel: Total 143, Triglycerides 55, HDL 39, LCL 93. Continued home medication: Rosuvastatin 5 mg.  Depression/Anxiety   Tobacco use  Pt is on bupropion 150 mg twice daily, continued home dose while inpatient. She makes have experienced anxiety to cause the onset of chest pain. Continue with home dose and reassess treatment outpatient.   Other stable chronic diseases were medically managed.   Items for Follow Up: Hospital follow with PCP one week after discharge Patient blood pressure medications held at discharge.  Consider restarting hospital follow-up.  PCP could consider restarting blood pressure medications at a decreased dose if indicated Consider altering depression/anxiety treatment     Significant Procedures: CTA coronary: No evidence of CAD  Significant Labs and Imaging:  Recent Labs  Lab 08/17/21 0920  WBC 5.7  HGB 13.6  HCT 41.3  PLT 315   Recent Labs  Lab 08/17/21 0920 08/17/21 1153 08/18/21 0244  NA 137  --  135  K 3.6  --  3.9  CL 107  --  106  CO2 22  --  19*  GLUCOSE 88  --  149*  BUN 13  --  16  CREATININE 0.70  --  0.88  CALCIUM 9.7  --  9.4  MG  --  1.9  --     CT ABDOMEN PELVIS WO CONTRAST  Result Date: 08/17/2021 CLINICAL DATA:  Abdominal pain EXAM: CT ABDOMEN AND PELVIS WITHOUT CONTRAST TECHNIQUE: Multidetector CT imaging of the abdomen and pelvis was performed following the standard protocol without IV contrast. COMPARISON:  None. FINDINGS: Lower chest: No acute abnormality. Hepatobiliary: No focal liver  abnormality is seen. No gallstones, gallbladder wall thickening, or biliary dilatation. Pancreas: Unremarkable. No pancreatic ductal dilatation or surrounding inflammatory changes. Spleen: Normal in size without focal abnormality. Adrenals/Urinary Tract: Low attenuation lesion of the right adrenal gland measuring 1.2 cm, likely an adenoma. No hydronephrosis or nephrolithiasis. Bladder is unremarkable. Stomach/Bowel: Stomach is within normal limits. Appendix is surgically absent. No evidence of bowel wall thickening, distention, or inflammatory changes. Vascular/Lymphatic: Aortic atherosclerosis. No enlarged abdominal or pelvic lymph nodes. Venous stent seen in the infrarenal IVC and bilateral common/external iliac veins. Reproductive: Uterus and bilateral adnexa are unremarkable. Other: No abdominal wall hernia or abnormality. No abdominopelvic ascites. Musculoskeletal: No acute or significant osseous findings. IMPRESSION: 1. No acute findings in the abdomen or pelvis. 2.  Aortic Atherosclerosis (ICD10-I70.0). Electronically Signed   By: Yetta Glassman M.D.   On: 08/17/2021 16:09   DG Chest 2 View  Result Date: 08/17/2021 CLINICAL DATA:  Chest pain, shortness of breath EXAM: CHEST - 2 VIEW COMPARISON:  None. FINDINGS: The heart size and mediastinal contours are within normal limits. Both lungs are clear. The visualized skeletal structures are unremarkable. IMPRESSION: No active cardiopulmonary disease. Electronically Signed   By: Keane Police D.O.   On: 08/17/2021 09:30   CT CORONARY MORPH W/CTA COR W/SCORE W/CA W/CM &/OR WO/CM  Addendum Date: 08/18/2021   ADDENDUM REPORT: 08/18/2021 12:17 CLINICAL DATA:  52F with chest pain EXAM: Cardiac/Coronary CTA TECHNIQUE: The patient was scanned on a [employer]. FINDINGS: A 100 kV prospective scan was triggered in the descending thoracic aorta at 111 HU's. Axial non-contrast 3 mm slices were carried out through the heart. The data set was analyzed on a  dedicated work station and scored using the [place]. Gantry rotation speed was 250 msecs and collimation was .6 mm. 0.8 mg of sl NTG was given. The 3D data set was reconstructed in 5% intervals of the 35-75 % of the R-R cycle. Phases were analyzed on a dedicated work station using MPR, MIP and VRT modes. The patient received 100 cc of contrast. Coronary Arteries:  Normal coronary origin.  Right dominance. RCA is a large dominant artery that gives rise to PDA and PLA. There is no plaque. Left main is a large artery that gives rise to LAD and LCX arteries. LAD is a large vessel that has no plaque. LCX is a non-dominant artery that gives rise to one large OM1 branch. There is no plaque. Other findings: Left Ventricle: Normal size Left Atrium: Normal size Pulmonary Veins: Normal configuration Right Ventricle: Normal size Right Atrium: Normal size Thoracic aorta: Normal size Pulmonary Arteries: Normal size Systemic Veins: Normal drainage Pericardium: Normal thickness IMPRESSION: 1. Coronary calcium score of 0. 2. Normal coronary origin with right dominance. 3. No evidence of CAD. There are significant step artifacts due to cardiac motion, but no CAD is seen CAD-RADS 0. No evidence of CAD (0%). Consider non-atherosclerotic causes of chest pain. Electronically Signed   By: Oswaldo Milian M.D.   On: 08/18/2021 12:17   Result Date: 08/18/2021 EXAM: OVER-READ INTERPRETATION  CT CHEST The following report is an over-read performed by  radiologist Dr. Suzy Bouchard of [facility], [place] on 08/18/2021. This over-read does not include interpretation of cardiac or coronary anatomy or pathology. The coronary calcium score/coronary CTA interpretation by the cardiologist is attached. COMPARISON:  None. FINDINGS: Limited view of the lung parenchyma demonstrates no suspicious nodularity. Airways are normal. Limited view of the mediastinum demonstrates no adenopathy. Esophagus normal. Limited view of the upper  abdomen unremarkable. Limited view of the skeleton and chest wall is unremarkable. IMPRESSION: No significant extracardiac findings. Electronically Signed: By: Suzy Bouchard M.D. On: 08/18/2021 11:26     Results/Tests Pending at Time of Discharge: None   Discharge Medications:  Allergies as of 08/18/2021       Reactions   Codeine    Iodine         Medication List     STOP taking these medications    diclofenac 75 MG EC tablet Commonly known as: VOLTAREN   losartan-hydrochlorothiazide 100-25 MG tablet Commonly known as: HYZAAR       TAKE these medications    albuterol 108 (90 Base) MCG/ACT inhaler Commonly known as: VENTOLIN HFA Inhale into the lungs every 6 (six) hours as needed for wheezing or shortness of breath.   aspirin 81 MG EC tablet Take 1 tablet (81 mg total) by mouth daily. Swallow whole. Start taking on: August 19, 2021 What changed:  how much to take when to take this   buPROPion 150 MG 24 hr tablet Commonly known as: WELLBUTRIN XL Take 150 mg by mouth 2 (two) times daily.   fluticasone 110 MCG/ACT inhaler Commonly known as: FLOVENT HFA Inhale into the lungs 2 (two) times daily.   ibandronate 150 MG tablet Commonly known as: BONIVA Take 150 mg by mouth every 30 (thirty) days.   multivitamin with minerals tablet Take 1 tablet by mouth daily.   naproxen 250 MG tablet Commonly known as: NAPROSYN Take 250 mg by mouth as needed (takes with prilosec).   nicotine 7 mg/24hr patch Commonly known as: NICODERM CQ - dosed in mg/24 hr Place 1 patch (7 mg total) onto the skin daily. Start taking on: August 19, 2021   nitroGLYCERIN 0.4 MG SL tablet Commonly known as: NITROSTAT Place 0.4 mg under the tongue every 5 (five) minutes as needed for chest pain.   omeprazole 20 MG capsule Commonly known as: PRILOSEC Take 20 mg by mouth daily as needed (with naproxen).   rosuvastatin 5 MG tablet Commonly known as: CRESTOR Take 5 mg by mouth  daily.   Vitamin D (Ergocalciferol) 1.25 MG (50000 UNIT) Caps capsule Commonly known as: DRISDOL Take 50,000 Units by mouth once a week.        Discharge Instructions: Please refer to Patient Instructions section of EMR for full details.  Patient was counseled important signs and symptoms that should prompt return to medical care, changes in medications, dietary instructions, activity restrictions, and follow up appointments.   Follow-Up Appointments:  Follow-up Information     Wendie Simmer, MD. Schedule an appointment as soon as possible for a visit in 1 week(s).   Specialty: Nurse Practitioner Contact information: [place] [address] [phone]                 Erskine Emery, MD 08/18/2021, 2:06 PM PGY-1, [place]

## 2021-08-18 NOTE — Hospital Course (Addendum)
Samantha Luna is a 60 yo F admitted to [facility] for SOB and fever, was found to have staph bacteremia. PMHx significant for CHF, HLD, OSA, asthma, tobacco use, COPD, h/o meningioma (2011), anxiety/depression, HTN, knee OA with chronic pain medications.    Chest Pain   ACS r/o  Patient initially presented to the emergency room with chest pain and accompanying abdominal pain.  EKG showed no acute changes as well as chest x-ray showing no active cardiopulmonary disease.  However, cardiology evaluated the patient and felt that due to multiple risk factors (hypertension, hyperlipidemia, PVD, tobacco use), it was warranted to observe overnight and continue with coronary CTA.  Patient did have good response from sublingual nitroglycerin.  HA1C 5.7 and lipid panel wnl. CTA showed no evidence of CAD as cause of chest pain.   Abdominal pain Abdominal pain resolved soon after presenting to the emergency room.  CT abdomen and pelvis showed no acute findings.  Abdominal exams remained unremarkable.  Patient was continued on Protonix 40 mg daily for GERD.  Hypertension Patient continued to have blood pressures within the normotensive range.  Hyzaar was held during her admission.  Hyperlipidemia Lipid Panel: Total 143, Triglycerides 55, HDL 39, LCL 93. Continued home medication: Rosuvastatin 5 mg.  Depression/Anxiety   Tobacco use  Pt is on bupropion 150 mg twice daily, continued home dose while inpatient. She makes have experienced anxiety to cause the onset of chest pain. Continue with home dose and reassess treatment outpatient.   Other stable chronic diseases were medically managed.   Items for Follow Up: Hospital follow with PCP one week after discharge Patient blood pressure medications held at discharge.  Consider restarting hospital follow-up.  PCP could consider restarting blood pressure medications at a decreased dose if indicated Consider altering depression/anxiety treatment

## 2021-08-18 NOTE — Discharge Instructions (Addendum)
Thank you for letting us care for you during your stay.  You were admitted to the [facility].   You were admitted for chest and stomach pain. We worked you up for any signs of heart attack or coronary artery disease, which was all normal, which is good! Your stomach pain went away while in the hospital, and this could definitely have been related to your GERD or chest pain. Anxiety could have also played a role.   You did not receive your blood pressure medications while you were hospitalized and your blood pressures were within normal limits.  We are not going to restart them at discharge but be sure to talk about your blood pressure medications with your primary care provider at the visit in 1 week.  If you check your blood pressures at home and they are elevated over 140/90 you can restart your blood pressure medicines.  We recommend follow up specifically with you pcp in one week for a hospital follow up.   Other important diagnoses identified during your stay were anxiety and depression. I would follow up with your PCP to discuss therapy options and/or changes in your medication. We also recommend tobacco cessation.  If you start to experience chest pain, shortness of breath, fevers, leg swelling, confusion, please seek health care immediately.    If your symptoms worsen or return, please return to the hospital.  Please let us know if you have questions about your stay at [facility].

## 2021-08-24 DIAGNOSIS — R262 Difficulty in walking, not elsewhere classified: Secondary | ICD-10-CM | POA: Diagnosis not present

## 2021-08-24 DIAGNOSIS — M25562 Pain in left knee: Secondary | ICD-10-CM | POA: Diagnosis not present

## 2021-08-24 DIAGNOSIS — M6281 Muscle weakness (generalized): Secondary | ICD-10-CM | POA: Diagnosis not present

## 2021-08-24 DIAGNOSIS — M25662 Stiffness of left knee, not elsewhere classified: Secondary | ICD-10-CM | POA: Diagnosis not present

## 2021-08-24 DIAGNOSIS — M1712 Unilateral primary osteoarthritis, left knee: Secondary | ICD-10-CM | POA: Diagnosis not present

## 2021-08-25 ENCOUNTER — Other Ambulatory Visit: Payer: Self-pay

## 2021-08-25 ENCOUNTER — Ambulatory Visit: Payer: Medicare HMO | Admitting: Cardiology

## 2021-08-25 ENCOUNTER — Encounter: Payer: Self-pay | Admitting: Cardiology

## 2021-08-25 VITALS — BP 150/80 | HR 81 | Ht 61.0 in | Wt 201.6 lb

## 2021-08-25 DIAGNOSIS — Z01810 Encounter for preprocedural cardiovascular examination: Secondary | ICD-10-CM | POA: Diagnosis not present

## 2021-08-25 DIAGNOSIS — E669 Obesity, unspecified: Secondary | ICD-10-CM | POA: Insufficient documentation

## 2021-08-25 DIAGNOSIS — R0789 Other chest pain: Secondary | ICD-10-CM | POA: Diagnosis not present

## 2021-08-25 DIAGNOSIS — I10 Essential (primary) hypertension: Secondary | ICD-10-CM | POA: Insufficient documentation

## 2021-08-25 DIAGNOSIS — E782 Mixed hyperlipidemia: Secondary | ICD-10-CM

## 2021-08-25 HISTORY — DX: Essential (primary) hypertension: I10

## 2021-08-25 HISTORY — DX: Mixed hyperlipidemia: E78.2

## 2021-08-25 NOTE — Patient Instructions (Addendum)
Medication Instructions:  Your physician recommends that you continue on your current medications as directed. Please refer to the Current Medication list given to you today.  *If you need a refill on your cardiac medications before your next appointment, please call your pharmacy*   Lab Work: None If you have labs (blood work) drawn today and your tests are completely normal, you will receive your results only by: [place] (if you have MyChart) OR A paper copy in the mail If you have any lab test that is abnormal or we need to change your treatment, we will call you to review the results.   Testing/Procedures: None   Follow-Up: At [facility], you and your health needs are our priority.  As part of our continuing mission to provide you with exceptional heart care, we have created designated Provider Care Teams.  These Care Teams include your primary Cardiologist (physician) and Advanced Practice Providers (APPs -  Physician Assistants and Nurse Practitioners) who all work together to provide you with the care you need, when you need it.  We recommend signing up for the patient portal called "MyChart".  Sign up information is provided on this After Visit Summary.  MyChart is used to connect with patients for Virtual Visits (Telemedicine).  Patients are able to view lab/test results, encounter notes, upcoming appointments, etc.  Non-urgent messages can be sent to your provider as well.   To learn more about what you can do with MyChart, go to [email or web address].    Your next appointment:   1 year(s)  The format for your next appointment:   In Person  Provider:   Berniece Salines, DO     Other Instructions  From a cardiovascular standpoint you are cleared for surgery.

## 2021-08-25 NOTE — Progress Notes (Signed)
Cardiology Office Note:    Date:  08/25/2021   ID:  Samantha Luna, DOB [birth year], MRN [id number]  PCP:  Wendie Simmer, MD  Cardiologist:  Berniece Salines, DO  Electrophysiologist:  None   Referring MD: Wendie Simmer, MD   " I am having some chest wall pain"  History of Present Illness:    Samantha Luna is a 60 y.o. female with a hx of hypertension, hyperlipidemia, obesity, meningioma, per care everywhere in [place] the patient was noted to have congestive heart failure.  The patient was recently admitted to [facility] because she had been experiencing chest discomfort.  At that time she underwent a coronary CT scan with did not show any evidence of coronary artery disease or any lung pathology.  She was discharged home.  Today she tells that she is having intermittent discomfort.  Which is her upper left shoulder sometimes it "sometimes it is painful.  But her major problem is the fact that she experiences significant leg pain complaints at this time.   Past Medical History:  Diagnosis Date   Acute exacerbation of chronic obstructive airways disease ([place]) 11/09/2017   Hospitalization 11/09/17 in [place] palsy 11/10/2020   Congestive heart failure ([place]) 04/26/2010   Echo 11/13/20 in [place] showed normal ejection fraction, normal echo.    Hyperlipidemia 08/18/2021   Hypertension    Meningioma ([place]) 04/23/2010   01/28/13 Head CT for headaches: A left posterior parafalcine meningioma is seen that measures 1.5 cm,  previously measuring 1.3 cm. Basal cisterns and sulci are unremarkable. Impression:  Left posterior parafalcine meningioma is mildly increased in size since  prior without significant surrounding mass effect or acute process visualized.   Acute or subacute ischemia is not excluded with CT.    Restrictive lung disease 03/05/2018    No past surgical history on file.  Current Medications: Current Meds  Medication Sig   albuterol  (VENTOLIN HFA) 108 (90 Base) MCG/ACT inhaler Inhale into the lungs every 6 (six) hours as needed for wheezing or shortness of breath.   aspirin 81 MG EC tablet Take 1 tablet (81 mg total) by mouth daily. Swallow whole.   buPROPion (WELLBUTRIN XL) 150 MG 24 hr tablet Take 150 mg by mouth 2 (two) times daily.   fluticasone (FLOVENT HFA) 110 MCG/ACT inhaler Inhale into the lungs 2 (two) times daily.   ibandronate (BONIVA) 150 MG tablet Take 150 mg by mouth every 30 (thirty) days.   losartan-hydrochlorothiazide (HYZAAR) 100-25 MG tablet Take 1 tablet by mouth daily.   Multiple Vitamins-Minerals (MULTIVITAMIN WITH MINERALS) tablet Take 1 tablet by mouth daily.   naproxen (NAPROSYN) 250 MG tablet Take 250 mg by mouth as needed (takes with prilosec).   nicotine (NICODERM CQ - DOSED IN MG/24 HR) 7 mg/24hr patch Place 1 patch (7 mg total) onto the skin daily.   nitroGLYCERIN (NITROSTAT) 0.4 MG SL tablet Place 0.4 mg under the tongue every 5 (five) minutes as needed for chest pain.   omeprazole (PRILOSEC) 20 MG capsule Take 20 mg by mouth daily as needed (with naproxen).   rosuvastatin (CRESTOR) 5 MG tablet Take 5 mg by mouth daily.   Vitamin D, Ergocalciferol, (DRISDOL) 1.25 MG (50000 UNIT) CAPS capsule Take 50,000 Units by mouth once a week.     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  Tobacco Use   Smoking status: Every Day    Packs/day: 1.00    Years: 40.00    Pack years: 40.00    Types: Cigarettes   Smokeless tobacco: Never  Vaping Use   Vaping Use: Never used  Substance and Sexual Activity   Alcohol use: Yes    Comment: wine occ   Drug use: Never   Sexual activity: Not Currently  Other Topics Concern   Not on file   Social History Narrative   Not on file   Social Determinants of Health   Financial Resource Strain: Not on file  Food Insecurity: Not on file  Transportation Needs: Not on file  Physical Activity: Not on file  Stress: Not on file  Social Connections: Not on file     Family History: The patient's family history includes Dementia in her father.  ROS:   Review of Systems  Constitution: Negative for decreased appetite, fever and weight gain.  HENT: Negative for congestion, ear discharge, hoarse voice and sore throat.   Eyes: Negative for discharge, redness, vision loss in right eye and visual halos.  Cardiovascular: Reports for chest pain.negative for dyspnea on exertion, leg swelling, orthopnea and palpitations.  Respiratory: Negative for cough, hemoptysis, shortness of breath and snoring.   Endocrine: Negative for heat intolerance and polyphagia.  Hematologic/Lymphatic: Negative for bleeding problem. Does not bruise/bleed easily.  Skin: Negative for flushing, nail changes, rash and suspicious lesions.  Musculoskeletal: Negative for arthritis, joint pain, muscle cramps, myalgias, neck pain and stiffness.  Gastrointestinal: Negative for abdominal pain, bowel incontinence, diarrhea and excessive appetite.  Genitourinary: Negative for decreased libido, genital sores and incomplete emptying.  Neurological: Negative for brief paralysis, focal weakness, headaches and loss of balance.  Psychiatric/Behavioral: Negative for altered mental status, depression and suicidal ideas.  Allergic/Immunologic: Negative for HIV exposure and persistent infections.    EKGs/Labs/Other Studies Reviewed:    The following studies were reviewed today:   EKG:  The ekg ordered today demonstrates sinus rhythm, heart rate 81 bpm.  CCTA 08/18/2021 Coronary Arteries:  Normal coronary origin.  Right dominance.   RCA is a large dominant artery that gives rise to PDA and PLA. There is no plaque.   Left main  is a large artery that gives rise to LAD and LCX arteries.   LAD is a large vessel that has no plaque.   LCX is a non-dominant artery that gives rise to one large OM1 branch. There is no plaque.   Other findings:   Left Ventricle: Normal size   Left Atrium: Normal size   Pulmonary Veins: Normal configuration   Right Ventricle: Normal size   Right Atrium: Normal size   Thoracic aorta: Normal size   Pulmonary Arteries: Normal size   Systemic Veins: Normal drainage   Pericardium: Normal thickness   IMPRESSION: 1. Coronary calcium score of 0.   2. Normal coronary origin with right dominance.   3. No evidence of CAD. There are significant step artifacts due to cardiac motion, but no CAD is seen   CAD-RADS 0. No evidence of CAD (0%). Consider non-atherosclerotic causes of chest pain.     Electronically Signed   By: Oswaldo Milian M.D.   On: 08/18/2021 12:17    Addended by Donato Heinz, MD on 08/18/2021 12:19  PM   Study Result  Narrative & Impression  EXAM: OVER-READ INTERPRETATION  CT CHEST   The following report is an over-read performed by radiologist Dr. Suzy Bouchard of [facility], [place] on 08/18/2021. This over-read does not include interpretation of cardiac or coronary anatomy or pathology. The coronary calcium score/coronary CTA interpretation by the cardiologist is attached.   COMPARISON:  None.   FINDINGS: Limited view of the lung parenchyma demonstrates no suspicious nodularity. Airways are normal.   Limited view of the mediastinum demonstrates no adenopathy. Esophagus normal.   Limited view of the upper abdomen unremarkable.   Limited view of the skeleton and chest wall is unremarkable.   IMPRESSION: No significant extracardiac findings.   Electronically Signed: By: Suzy Bouchard M.D.    Recent Labs: 08/10/2021: TSH 2.34 08/17/2021: Hemoglobin 13.6; Magnesium 1.9; Platelets 315 08/18/2021: BUN 16;  Creatinine, Ser 0.88; Potassium 3.9; Sodium 135  Recent Lipid Panel    Component Value Date/Time   CHOL 143 08/18/2021 0244   TRIG 55 08/18/2021 0244   HDL 39 (L) 08/18/2021 0244   CHOLHDL 3.7 08/18/2021 0244   VLDL 11 08/18/2021 0244   LDLCALC 93 08/18/2021 0244    Physical Exam:    VS:  BP (!) 150/80 (BP Location: Right Arm)    Pulse 81    Ht 5\' 1"  (1.549 m)    Wt 201 lb 9.6 oz (91.4 kg)    BMI 38.09 kg/m     Wt Readings from Last 3 Encounters:  08/25/21 201 lb 9.6 oz (91.4 kg)  08/18/21 194 lb 8 oz (88.2 kg)  08/10/21 199 lb 3.2 oz (90.4 kg)     GEN: Well nourished, well developed in no acute distress HEENT: Normal NECK: No JVD; No carotid bruits LYMPHATICS: No lymphadenopathy CARDIAC: S1S2 noted,RRR, no murmurs, rubs, gallops RESPIRATORY:  Clear to auscultation without rales, wheezing or rhonchi  ABDOMEN: Soft, non-tender, non-distended, +bowel sounds, no guarding. EXTREMITIES: No edema, No cyanosis, no clubbing MUSCULOSKELETAL:  No deformity  SKIN: Warm and dry NEUROLOGIC:  Alert and oriented x 3, non-focal PSYCHIATRIC:  Normal affect, good insight  ASSESSMENT:    1. Preop cardiovascular exam   2. Primary hypertension   3. Mixed hyperlipidemia   4. Obesity (BMI 30-39.9)   5. Musculoskeletal chest pain    PLAN:    The patient does not have any unstable cardiac conditions.  Upon evaluation today, she can achieve 4 METs or greater without anginal symptoms.  According to [facility] and AHA guidelines, she requires no further cardiac workup prior to her noncardiac surgery and should be at acceptable risk.  Our service is available as necessary in the perioperative period.  In terms of her chest pain which appears to be musculoskeletal and she recently had coronary CT scan with does not show any evidence of coronary artery disease.  The patient does have NSAIDs at home we will have her try this as needed.  Her blood pressure slightly elevated in the office today which is  also manually taken by me 152/80 mmHg.  She tells me at home that her blood pressure usually is well below 130/80 mmHg.  She takes losartan 100 mg along with hydrochlorothiazide 25 mg combination pill.  Advised the patient that her blood pressure daily.  Should her blood pressure be over 140/90 mmHg x3 readings to notify my office immediately.  The patient is in agreement with the above plan. The patient left the office in stable condition.  The patient will  follow up in  Medication Adjustments/Labs and Tests Ordered: Current medicines are reviewed at length with the patient today.  Concerns regarding medicines are outlined above.  Orders Placed This Encounter  Procedures   EKG 12-Lead   No orders of the defined types were placed in this encounter.   Patient Instructions  Medication Instructions:  Your physician recommends that you continue on your current medications as directed. Please refer to the Current Medication list given to you today.  *If you need a refill on your cardiac medications before your next appointment, please call your pharmacy*   Lab Work: None If you have labs (blood work) drawn today and your tests are completely normal, you will receive your results only by: [facility] (if you have MyChart) OR A paper copy in the mail If you have any lab test that is abnormal or we need to change your treatment, we will call you to review the results.   Testing/Procedures: None   Follow-Up: At [facility], you and your health needs are our priority.  As part of our continuing mission to provide you with exceptional heart care, we have created designated Provider Care Teams.  These Care Teams include your primary Cardiologist (physician) and Advanced Practice Providers (APPs -  Physician Assistants and Nurse Practitioners) who all work together to provide you with the care you need, when you need it.  We recommend signing up for the patient portal called "MyChart".   Sign up information is provided on this After Visit Summary.  MyChart is used to connect with patients for Virtual Visits (Telemedicine).  Patients are able to view lab/test results, encounter notes, upcoming appointments, etc.  Non-urgent messages can be sent to your provider as well.   To learn more about what you can do with MyChart, go to [email or web address].    Your next appointment:   1 year(s)  The format for your next appointment:   In Person  Provider:   Berniece Salines, DO     Other Instructions  From a cardiovascular standpoint you are cleared for surgery.    Adopting a Healthy Lifestyle.  Know what a healthy weight is for you (roughly BMI <25) and aim to maintain this   Aim for 7+ servings of fruits and vegetables daily   65-80+ fluid ounces of water or unsweet tea for healthy kidneys   Limit to max 1 drink of alcohol per day; avoid smoking/tobacco   Limit animal fats in diet for cholesterol and heart health - choose grass fed whenever available   Avoid highly processed foods, and foods high in saturated/trans fats   Aim for low stress - take time to unwind and care for your mental health   Aim for 150 min of moderate intensity exercise weekly for heart health, and weights twice weekly for bone health   Aim for 7-9 hours of sleep daily   When it comes to diets, agreement about the perfect plan isnt easy to find, even among the experts. Experts at the [employer] developed an idea known as the Healthy Eating Plate. Just imagine a plate divided into logical, healthy portions.   The emphasis is on diet quality:   Load up on vegetables and fruits - one-half of your plate: Aim for color and variety, and remember that potatoes dont count.   Go for whole grains - one-quarter of your plate: Whole wheat, barley, wheat berries, quinoa, oats, brown rice, and foods made with them. If you want  pasta, go with whole wheat pasta.   Protein power -  one-quarter of your plate: Fish, chicken, beans, and nuts are all healthy, versatile protein sources. Limit red meat.   The diet, however, does go beyond the plate, offering a few other suggestions.   Use healthy plant oils, such as olive, canola, soy, corn, sunflower and peanut. Check the labels, and avoid partially hydrogenated oil, which have unhealthy trans fats.   If youre thirsty, drink water. Coffee and tea are good in moderation, but skip sugary drinks and limit milk and dairy products to one or two daily servings.   The type of carbohydrate in the diet is more important than the amount. Some sources of carbohydrates, such as vegetables, fruits, whole grains, and beans-are healthier than others.   Finally, stay active  Signed, Berniece Salines, DO  08/25/2021 11:47 AM    [place]

## 2021-08-26 DIAGNOSIS — M1712 Unilateral primary osteoarthritis, left knee: Secondary | ICD-10-CM | POA: Diagnosis not present

## 2021-08-26 DIAGNOSIS — M25562 Pain in left knee: Secondary | ICD-10-CM | POA: Diagnosis not present

## 2021-08-26 DIAGNOSIS — M25662 Stiffness of left knee, not elsewhere classified: Secondary | ICD-10-CM | POA: Diagnosis not present

## 2021-08-26 DIAGNOSIS — R262 Difficulty in walking, not elsewhere classified: Secondary | ICD-10-CM | POA: Diagnosis not present

## 2021-08-26 DIAGNOSIS — M6281 Muscle weakness (generalized): Secondary | ICD-10-CM | POA: Diagnosis not present

## 2021-09-08 ENCOUNTER — Ambulatory Visit
Admission: RE | Admit: 2021-09-08 | Discharge: 2021-09-08 | Disposition: A | Discharge status: Home / Self Care | Payer: Medicare HMO | Source: Ambulatory Visit | Attending: Physician Assistant | Admitting: Physician Assistant

## 2021-09-08 ENCOUNTER — Other Ambulatory Visit: Payer: Self-pay

## 2021-09-08 DIAGNOSIS — R413 Other amnesia: Secondary | ICD-10-CM

## 2021-09-09 ENCOUNTER — Encounter: Payer: Self-pay | Admitting: Pulmonary Disease

## 2021-09-09 ENCOUNTER — Telehealth: Payer: Self-pay | Admitting: Pulmonary Disease

## 2021-09-09 ENCOUNTER — Ambulatory Visit (INDEPENDENT_AMBULATORY_CARE_PROVIDER_SITE_OTHER): Payer: Medicare HMO | Admitting: Pulmonary Disease

## 2021-09-09 VITALS — BP 126/84 | HR 87 | Ht 61.0 in | Wt 195.4 lb

## 2021-09-09 DIAGNOSIS — Z01818 Encounter for other preprocedural examination: Secondary | ICD-10-CM

## 2021-09-09 DIAGNOSIS — Z720 Tobacco use: Secondary | ICD-10-CM

## 2021-09-09 NOTE — Progress Notes (Addendum)
Samantha Luna    [id number]    13-Jul-1961  Primary Care Physician:Roberson, Carmell Austria, MD  Referring Physician: Wendie Simmer, MD [address] [place],  [place] [address]  Chief complaint:   Patient with a history of obstructive sleep apnea waiting until after  HPI:  Patient with a history of obstructive sleep apnea-has been using CPAP only periodically Got away from using CPAP on a regular basis with the recall Her daughter was present during the visit and she stated that her machine was not on the recall list  She does have history of asthma and is on Flovent, albuterol use as needed  At some point she was using CPAP on a regular basis  She currently she recollects that the study showed that she stop breathing about 11 times an hour [place] Used CPAP regularly and then got away from using it Usually goes to bed about 7 PM, final wake up time about 4 AM, she does have about 1 or 2 awakenings Denies any headaches in the morning She does have dryness of her mouth in the mornings No family history of sleep apnea known to her She does have a history of hypertension, has had a negative stress test recently, does have a history of angina  PFT from 2020 did reveal mild obstructive disease, mild restriction She is limited because she has a lot of knee pain and is in the process of  Outpatient Encounter Medications as of 09/09/2021  Medication Sig   albuterol (VENTOLIN HFA) 108 (90 Base) MCG/ACT inhaler Inhale into the lungs every 6 (six) hours as needed for wheezing or shortness of breath.   aspirin 81 MG EC tablet Take 1 tablet (81 mg total) by mouth daily. Swallow whole.   buPROPion (WELLBUTRIN XL) 150 MG 24 hr tablet Take 150 mg by mouth 2 (two) times daily.   fluticasone (FLOVENT HFA) 110 MCG/ACT inhaler Inhale into the lungs 2 (two) times daily.   ibandronate (BONIVA) 150 MG tablet Take 150 mg by mouth every 30 (thirty) days.    losartan-hydrochlorothiazide (HYZAAR) 100-25 MG tablet Take 1 tablet by mouth daily.   Multiple Vitamins-Minerals (MULTIVITAMIN WITH MINERALS) tablet Take 1 tablet by mouth daily.   naproxen (NAPROSYN) 250 MG tablet Take 250 mg by mouth as needed (takes with prilosec).   nicotine (NICODERM CQ - DOSED IN MG/24 HR) 7 mg/24hr patch Place 1 patch (7 mg total) onto the skin daily.   nitroGLYCERIN (NITROSTAT) 0.4 MG SL tablet Place 0.4 mg under the tongue every 5 (five) minutes as needed for chest pain.   omeprazole (PRILOSEC) 20 MG capsule Take 20 mg by mouth daily as needed (with naproxen).   rosuvastatin (CRESTOR) 5 MG tablet Take 5 mg by mouth daily.   Vitamin D, Ergocalciferol, (DRISDOL) 1.25 MG (50000 UNIT) CAPS capsule Take 50,000 Units by mouth once a week.   No facility-administered encounter medications on file as of 09/09/2021.    Allergies as of 09/09/2021 - Review Complete 09/09/2021  Allergen Reaction Noted   Codeine  05/16/2021   Iodine  05/16/2021    Past Medical History:  Diagnosis Date   Acute exacerbation of chronic obstructive airways disease ([place]) 11/09/2017   Hospitalization 11/09/17 in [place] palsy 11/10/2020   Congestive heart failure ([place]) 04/26/2010   Echo 11/13/20 in [place] showed normal ejection fraction, normal echo.    Hyperlipidemia 08/18/2021   Hypertension  Meningioma ([place]) 04/23/2010   01/28/13 Head CT for headaches: A left posterior parafalcine meningioma is seen that measures 1.5 cm,  previously measuring 1.3 cm. Basal cisterns and sulci are unremarkable. Impression:  Left posterior parafalcine meningioma is mildly increased in size since  prior without significant surrounding mass effect or acute process visualized.   Acute or subacute ischemia is not excluded with CT.    Restrictive lung disease 03/05/2018    No past surgical history on file.  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  Tobacco Use   Smoking status: Every Day    Packs/day: 1.00    Years: 40.00    Pack years: 40.00    Types: Cigarettes   Smokeless tobacco: Never  Vaping Use   Vaping Use: Never used  Substance and Sexual Activity   Alcohol use: Yes    Comment: wine occ   Drug use: Never   Sexual activity: Not Currently  Other Topics Concern   Not on file  Social History Narrative   Not on file   Social Determinants of Health   Financial Resource Strain: Not on file  Food Insecurity: Not on file  Transportation Needs: Not on file  Physical Activity: Not on file  Stress: Not on file  Social Connections: Not on file  Intimate Partner Violence: Not on file    Review of Systems  Constitutional:  Negative for fatigue and fever.  Respiratory:  Positive for shortness of breath.   Psychiatric/Behavioral:  Positive for sleep disturbance.    Vitals:   09/09/21 0901  BP: 126/84  Pulse: 87  SpO2: 99%     Physical Exam Constitutional:      Appearance: She is obese.  HENT:     Head: Normocephalic.     Right Ear: Tympanic membrane normal.     Nose: No congestion.     Mouth/Throat:     Mouth: Mucous membranes are moist.     Comments: Mallampati 4, crowded oropharynx, macroglossia Eyes:     Pupils: Pupils are equal, round, and reactive to light.  Cardiovascular:     Rate and Rhythm: Normal rate and regular rhythm.     Heart sounds: No murmur heard.   No friction rub.  Pulmonary:     Effort: No respiratory distress.     Breath sounds: No stridor. No wheezing or rhonchi.  Musculoskeletal:     Cervical back: No rigidity or tenderness.  Neurological:     Mental Status: She is alert.   Results of the Epworth flowsheet  09/09/2021  Sitting and reading 3  Watching TV 3  Sitting, inactive in a public place (e.g. a theatre or a meeting) 2  As a passenger in a car for an hour without a break 2  Lying down to rest in the afternoon when circumstances permit 3  Sitting and talking to someone 2  Sitting quietly after a lunch without alcohol 2  In a car, while stopped for a few minutes in traffic 0  Total score 17     Data Reviewed: Download from her machine recently in the last 3 months reveals 21%  compliance CPAP of 6 Residual AHI 1.9  A previous PFT from 2020 was brought in by her daughter showing mild obstructive disease, mild restrictive disease-reviewed  Assessment:  Obstructive sleep apnea -Noncompliance -Concerns were discussed including the machine recall, our machine is not on recall  History of asthma -On Flovent and albuterol as needed -does use inhaler with a spacer  Regarding knee surgery -She does not have any acutely reversible respiratory complaints at present and is cleared for surgery from a respiratory perspective  Reformed smoker, quit within the last month  Plan/Recommendations: I did encourage her to start using CPAP on a nightly basis as this is the only means of getting to the bottom of why she may not be tolerating it well lately  Encouraged weight loss efforts  Encouraged to continue to stay off cigarettes  She is cleared for surgery from a respiratory perspective as she does not have any acutely reversible problems at present  Encouraged to continue using Flovent regularly  I will see her back in about 6 weeks to assess compliance  I will repeat a PFT to compare with previous  Inhaler technique was reviewed in the office today-she does use a spacer  Encouraged to call with significant concerns  I spent 45 minutes dedicated to the care of this patient on the date of this encounter to include previsit review of records, face-to-face time with the patient  discussing conditions above, post visit ordering of testing, clinical documentation with electronic health record, making appropriate referrals as documented, and communicated necessary findings to members of the patient's care team   Sherrilyn Rist MD [facility] 09/09/2021, 10:48 AM  CC: Wendie Simmer, MD  Will schedule for lung cancer screening CT

## 2021-09-09 NOTE — Telephone Encounter (Signed)
Referral for lung cancer screening CT

## 2021-09-09 NOTE — Patient Instructions (Signed)
I will see you back in 6 weeks  Continue using her Flovent twice a day, albuterol use as needed  Try and use your CPAP nightly  Breathing study at next visit  You are cleared from a pulmonary perspective to go for surgery Best thing you have done is quit smoking  Call us with significant concerns

## 2021-09-11 NOTE — Progress Notes (Signed)
New Patient Note  RE: Samantha Luna MRN: [id number] DOB: 02/23/1961 Date of Office Visit: 09/12/2021  Consult requested by: Gaynelle Arabian, MD Primary care provider: Wendie Simmer, MD  Chief Complaint: Allergy Testing (Testing for metal for knee surgery.)  History of Present Illness: I had the pleasure of seeing Samantha Luna for initial evaluation at the [facility] on 09/12/2021. She is a 61 y.o. female, who is referred here by Dr. Wynelle Link (orthopedics) for the evaluation of nickel metal allergy.  She is accompanied today by her daughter who provided/contributed to the history.   Patient is scheduled for left knee replacement on 10/03/2021. Patient had issues with costume jewelry which caused rashes/irritations in the past. Has not worn any recently. No issues with white gold or silver. No issues with some nickel coated jewelry.   No issues with watches, rings, bracelets, necklaces, belts, buttons on pants.   Prior joint replacements or metals in the body: no but she has stents in both her legs for vascular disease and had no issues.   Assessment and Plan: Samantha Luna is a 61 y.o. female with: Allergic contact dermatitis Scheduled for left knee replacement.  Issues with costume jewelry causing irritation in the past.  No issues with other metals.  No prior joint replacement. Discussed with patient and daughter that patch testing tests for contact dermatitis and sometimes it does not correlate to how one will react to metals in the body. Positive patch testing results can help in avoiding those items however it is possible to get false negative results.  Nevertheless, this is the most accessible test for metal sensitivity currently available.  Patches placed today. Please avoid strenuous physical activities and do not get the patches on the back wet. No showering until final patch reading done. Okay to take antihistamines for itching but avoid placing any creams on the back  where the patches are. We will remove the patches on Wednesday and will do our initial read. Then you will come back on Friday for a final read.  Return in about 2 days (around 09/14/2021) for Patch reading.  No orders of the defined types were placed in this encounter.  Lab Orders  No laboratory test(s) ordered today    Other allergy screening: Asthma: yes Currently takes Flovent and albuterol prn with good benefit as long as she is compliant with her medications.  She also has COPD.  Just quit smoking.   Rhino conjunctivitis: no Food allergy: no Medication allergy: yes Codeine - itching Iodine - sneezing   Hymenoptera allergy: no Urticaria: no Eczema:no History of recurrent infections suggestive of immunodeficency: no  Diagnostics:   Metals Patch - 09/12/21 1002     Time Antigen Placed 4268    Manufacturer Other   [employer]   Location Back    Number of Test 11    Lot # [id number]    Reading Interval Day 1;Day 3;Day 5    Select Select             Past Medical History: Patient Active Problem List   Diagnosis Date Noted   Allergic contact dermatitis 09/12/2021   Preop cardiovascular exam 08/25/2021   Primary hypertension 08/25/2021   Mixed hyperlipidemia 08/25/2021   Obesity (BMI 30-39.9) 08/25/2021   Musculoskeletal chest pain 08/25/2021   Hypertension 08/18/2021   Morbid obesity ([place]) 08/18/2021   BMI 37.0-37.9, adult 08/18/2021   Obstructive sleep apnea 08/18/2021   Memory difficulties 08/18/2021   Grief 08/18/2021  Primary osteoarthritis of left knee 05/17/2021   CPAP (continuous positive airway pressure) dependence 11/10/2020   Osteoarthritis of right knee 11/10/2020   Personal history of COVID-19 07/20/2020   Atherosclerosis of native arteries of extremities with intermittent claudication, right leg ([place]) 08/11/2019   Palpitations 01/06/2019   Laryngopharyngeal reflux 08/14/2018   High cholesterol 02/11/2016   COPD (chronic  obstructive pulmonary disease) ([place]) 02/11/2016   Tobacco use 02/11/2016   Chest pain 05/07/2010   Edema of both legs 04/26/2010   Past Medical History:  Diagnosis Date   Acute exacerbation of chronic obstructive airways disease ([place]) 11/09/2017   Hospitalization 11/09/17 in [place] palsy 11/10/2020   Congestive heart failure ([place]) 04/26/2010   Echo 11/13/20 in [place] showed normal ejection fraction, normal echo.    Hyperlipidemia 08/18/2021   Hypertension    Meningioma ([place]) 04/23/2010   01/28/13 Head CT for headaches: A left posterior parafalcine meningioma is seen that measures 1.5 cm,  previously measuring 1.3 cm. Basal cisterns and sulci are unremarkable. Impression:  Left posterior parafalcine meningioma is mildly increased in size since  prior without significant surrounding mass effect or acute process visualized.   Acute or subacute ischemia is not excluded with CT.    Restrictive lung disease 03/05/2018   Past Surgical History: Past Surgical History:  Procedure Laterality Date   TONSILLECTOMY     Medication List:  Current Outpatient Medications  Medication Sig Dispense Refill   albuterol (VENTOLIN HFA) 108 (90 Base) MCG/ACT inhaler Inhale into the lungs every 6 (six) hours as needed for wheezing or shortness of breath.     aspirin 81 MG EC tablet Take 1 tablet (81 mg total) by mouth daily. Swallow whole. 30 tablet 11   buPROPion (WELLBUTRIN XL) 150 MG 24 hr tablet Take 150 mg by mouth 2 (two) times daily.     fluticasone (FLOVENT HFA) 110 MCG/ACT inhaler Inhale into the lungs 2 (two) times daily.     ibandronate (BONIVA) 150 MG tablet Take 150 mg by mouth every 30 (thirty) days.     losartan-hydrochlorothiazide (HYZAAR) 100-25 MG tablet Take 1 tablet by mouth daily.     Multiple Vitamins-Minerals (MULTIVITAMIN WITH MINERALS) tablet Take 1 tablet by mouth daily.     naproxen (NAPROSYN) 250 MG tablet Take 250 mg by mouth as needed  (takes with prilosec).     nitroGLYCERIN (NITROSTAT) 0.4 MG SL tablet Place 0.4 mg under the tongue every 5 (five) minutes as needed for chest pain.     omeprazole (PRILOSEC) 20 MG capsule Take 20 mg by mouth daily as needed (with naproxen).     rosuvastatin (CRESTOR) 5 MG tablet Take 5 mg by mouth daily.     Vitamin D, Ergocalciferol, (DRISDOL) 1.25 MG (50000 UNIT) CAPS capsule Take 50,000 Units by mouth once a week.     nicotine (NICODERM CQ - DOSED IN MG/24 HR) 7 mg/24hr patch Place 1 patch (7 mg total) onto the skin daily. (Patient not taking: Reported on 09/12/2021) 28 patch 0   No current facility-administered medications for this visit.   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  Tobacco Use   Smoking status: Every Day    Packs/day: 1.00    Years: 40.00    Pack years: 40.00    Types: Cigarettes   Smokeless tobacco: Never  Vaping Use   Vaping Use: Never used  Substance and Sexual Activity   Alcohol use: Yes    Comment: wine occ   Drug use: Never   Sexual activity: Not Currently  Other Topics Concern   Not on file  Social History Narrative   Not on file   Social Determinants of Health   Financial Resource Strain: Not on file  Food Insecurity: Not on file  Transportation Needs: Not on file  Physical Activity: Not on file  Stress: Not on file  Social Connections: Not on file   Lives in an apartment. Smoking: quit in 08/28/2021. Used to smoke 1 ppd x 45 yrs Occupation: retired  Programme researcher, broadcasting/film/video HistoryFreight forwarder in the house: no Charity fundraiser in the family room: no Carpet in the bedroom: no Heating: gas Cooling: central Pet: no  Family  History: Family History  Problem Relation Age of Onset   Dementia Father    Asthma Sister    Immunodeficiency Daughter    Asthma Daughter    Asthma Daughter    Immunodeficiency Grandson    Asthma Grandson     Review of Systems  Constitutional:  Negative for appetite change, chills, fever and unexpected weight change.  HENT:  Negative for congestion and rhinorrhea.   Eyes:  Negative for itching.  Respiratory:  Negative for cough, chest tightness, shortness of breath and wheezing.   Cardiovascular:  Negative for chest pain.  Gastrointestinal:  Negative for abdominal pain.  Genitourinary:  Negative for difficulty urinating.  Skin:  Negative for rash.  Neurological:  Negative for headaches.   Objective: BP 140/80    Pulse 89    Temp (!) 97.3 F (36.3 C) (Temporal)    Resp 16    Ht 5' 0.5" (1.537 m)    Wt 199 lb 6.4 oz (90.4 kg)    SpO2 98%    BMI 38.30 kg/m  Body mass index is 38.3 kg/m. Physical Exam Vitals and nursing note reviewed.  Constitutional:      Appearance: Normal appearance. She is well-developed.  HENT:     Head: Normocephalic and atraumatic.     Right Ear: Tympanic membrane and external ear normal.     Left Ear: Tympanic membrane and external ear normal.     Nose: Nose normal.     Mouth/Throat:     Mouth: Mucous membranes are moist.     Pharynx: Oropharynx is clear.  Eyes:     Conjunctiva/sclera: Conjunctivae normal.  Cardiovascular:     Rate and Rhythm: Normal rate and regular rhythm.     Heart sounds: Normal heart sounds. No murmur heard.   No friction rub. No gallop.  Pulmonary:     Effort: Pulmonary effort is normal.     Breath sounds: Normal breath sounds. No wheezing, rhonchi or rales.  Musculoskeletal:     Cervical back: Neck supple.  Skin:    General: Skin is warm.     Findings: No rash.  Neurological:     Mental Status: She is alert and oriented to person, place, and time.  Psychiatric:        Behavior: Behavior normal.  The plan was  reviewed with the patient/family, and all questions/concerned were addressed.  It was my pleasure to see  Samantha Luna today and participate in her care. Please feel free to contact me with any questions or concerns.  Sincerely,  Rexene Alberts, DO Allergy & Immunology  [facility] office: [place] office: [phone]

## 2021-09-12 ENCOUNTER — Ambulatory Visit (INDEPENDENT_AMBULATORY_CARE_PROVIDER_SITE_OTHER): Payer: Medicare HMO | Admitting: Allergy

## 2021-09-12 ENCOUNTER — Encounter: Payer: Self-pay | Admitting: Allergy

## 2021-09-12 ENCOUNTER — Other Ambulatory Visit: Payer: Self-pay

## 2021-09-12 VITALS — BP 140/80 | HR 89 | Temp 97.3°F | Resp 16 | Ht 60.5 in | Wt 199.4 lb

## 2021-09-12 DIAGNOSIS — L239 Allergic contact dermatitis, unspecified cause: Secondary | ICD-10-CM

## 2021-09-12 HISTORY — DX: Allergic contact dermatitis, unspecified cause: L23.9

## 2021-09-12 NOTE — Patient Instructions (Signed)
Discussed with patient that patch testing tests for contact dermatitis and sometimes it does not correlate to how one will react to metals in the body. Positive patch testing results can help in avoiding those items however it is possible to get false negative results.  Nevertheless, this is the most accessible test for metal sensitivity currently available.  Patches placed today. Please avoid strenuous physical activities and do not get the patches on the back wet. No showering until final patch reading done. Okay to take antihistamines for itching but avoid placing any creams on the back where the patches are. We will remove the patches on Wednesday and will do our initial read. Then you will come back on Friday for a final read.   

## 2021-09-12 NOTE — Assessment & Plan Note (Signed)
Scheduled for left knee replacement.  Issues with costume jewelry causing irritation in the past.  No issues with other metals.  No prior joint replacement.  Discussed with patient and daughter that patch testing tests for contact dermatitis and sometimes it does not correlate to how one will react to metals in the body. Positive patch testing results can help in avoiding those items however it is possible to get false negative results.  Nevertheless, this is the most accessible test for metal sensitivity currently available.   Patches placed today.  Please avoid strenuous physical activities and do not get the patches on the back wet. No showering until final patch reading done.  Okay to take antihistamines for itching but avoid placing any creams on the back where the patches are.  We will remove the patches on Wednesday and will do our initial read.  Then you will come back on Friday for a final read.

## 2021-09-13 NOTE — Telephone Encounter (Signed)
Referral to lung can screening made

## 2021-09-13 NOTE — Progress Notes (Signed)
° °  Follow Up Note  RE: Vayla Wilhelmi MRN: [id number] DOB: March 18, 1961 Date of Office Visit: 09/14/2021  Referring provider: Wendie Simmer, MD Primary care provider: Wendie Simmer, MD  History of Present Illness: I had the pleasure of seeing Kennah Hehr for a follow up visit at the [facility] [place] [place] on 09/14/2021. She is a 61 y.o. female, who is being followed for possible contact dermatitis. Today she is here for initial patch test interpretation, given suspected history of contact dermatitis.   Diagnostics:  Metal patch test 48 hour reading:  All negative.    Metals Patch - 09/14/21 1045     Time Antigen Placed 3762    Manufacturer Other   [employer]   Location Back    Number of Test 11    Lot # [id number]    Reading Interval Day 1;Day 3;Day 5    Select Select    Aluminum Hydroxide 10% 0    Chromium chloride 1% 0    Cobalt chloride hexahydrate 1% 0    Molybdenum chloride 0.5% 0    Nickel sulfate hexahydrate 5% 0    Potassium dichromate 0.25% 0    Copper sulfate pentahydrate 2% 0    Tantal 1% 0    Titanium 0.1% 0    Manganese chloride 0.5% 0    Vanadium Pentoxide 10% 0              Assessment and Plan: Timaya is a 61 y.o. female with: Allergic contact dermatitis Past history - Scheduled for left knee replacement.  Issues with costume jewelry causing irritation in the past.  No issues with other metals.  No prior joint replacement. Interim history - 48 hour reading all negative to metal patch testing.   Return in about 2 days (around 09/16/2021) for Patch reading.  It was my pleasure to see Guadalupe today and participate in her care. Please feel free to contact me with any questions or concerns.  Sincerely,  Rexene Alberts, DO Allergy & Immunology  [facility] office: [phone] [facility] office: [place] office: [phone]

## 2021-09-14 ENCOUNTER — Other Ambulatory Visit: Payer: Self-pay

## 2021-09-14 ENCOUNTER — Ambulatory Visit: Payer: Medicare HMO | Admitting: Allergy

## 2021-09-14 ENCOUNTER — Encounter: Payer: Self-pay | Admitting: Allergy

## 2021-09-14 VITALS — Temp 98.7°F

## 2021-09-14 DIAGNOSIS — L239 Allergic contact dermatitis, unspecified cause: Secondary | ICD-10-CM

## 2021-09-14 NOTE — Assessment & Plan Note (Addendum)
Past history - Scheduled for left knee replacement.  Issues with costume jewelry causing irritation in the past.  No issues with other metals.  No prior joint replacement. Interim history - 48 hour reading all negative to metal patch testing.

## 2021-09-16 ENCOUNTER — Ambulatory Visit (INDEPENDENT_AMBULATORY_CARE_PROVIDER_SITE_OTHER): Payer: Medicare HMO | Admitting: Allergy

## 2021-09-16 ENCOUNTER — Ambulatory Visit (HOSPITAL_BASED_OUTPATIENT_CLINIC_OR_DEPARTMENT_OTHER): Payer: Medicare HMO | Admitting: Cardiovascular Disease

## 2021-09-16 ENCOUNTER — Other Ambulatory Visit: Payer: Self-pay

## 2021-09-16 ENCOUNTER — Encounter: Payer: Self-pay | Admitting: Allergy

## 2021-09-16 VITALS — Temp 98.0°F

## 2021-09-16 DIAGNOSIS — L239 Allergic contact dermatitis, unspecified cause: Secondary | ICD-10-CM

## 2021-09-16 NOTE — Progress Notes (Signed)
° ° °  Follow-up Note  RE: Samantha Luna MRN: [id number] DOB: 06-May-1961 Date of Office Visit: 09/16/2021  Primary care provider: Wendie Simmer, MD Referring provider: Wendie Simmer, MD   Samantha Luna returns to the office today for the final patch test interpretation, given suspected history of contact dermatitis.    Diagnostics:  Metal patch testing 96 hour reading:  All negative   Metals Patch     Row Name 09/16/21 1100           Time Antigen Placed 1140       Manufacturer Other       Location Back       Number of Test 11       Lot # [id number]       Reading Interval Day 1;Day 3;Day 5       Select [employer]       Aluminum Hydroxide 10% 0       Chromium chloride 1% 0       Cobalt chloride hexahydrate 1% 0       Molybdenum chloride 0.5% 0       Nickel sulfate hexahydrate 5% 0       Potassium dichromate 0.25% 0       Copper sulfate pentahydrate 2% 0       Tantal 1% 0       Titanium 0.1% 0       Manganese chloride 0.5% 0       Vanadium Pentoxide 10% 0                Plan:  Samantha Luna is a 61 y.o. female with: Allergic contact dermatitis Past history - Scheduled for left knee replacement.  Issues with costume jewelry causing irritation in the past.  No issues with other metals.  No prior joint replacement. Interim history - 96 hour reading all negative to metal patch testing.    Her metal patch series was negative to the above metal allergens.   Will notify Dr. Wynelle Link of negative metal test results.    It was my pleasure to see Samantha Luna today and participate in her care. Please feel free to contact my office with any questions or concerns.  Prudy Feeler, MD [facility]

## 2021-09-20 NOTE — Progress Notes (Signed)
Covid test on 09/29/21 at 0830am.  Come thru main entrance at [facility] long.  Have a seat in the lobby on the right as you come thru the door.  Call [phone] and give them your name andlet them know you are here for covid testing.    Your procedure is scheduled on:                10/03/21   Report to [facility] Main  Entrance   Report to admitting at   1100AM     Call this number if you have problems the morning of surgery [phone]    REMEMBER: NO  SOLID FOOD CANDY OR GUM AFTER MIDNIGHT. CLEAR LIQUIDS UNTIL   1045am        . NOTHING BY MOUTH EXCEPT CLEAR LIQUIDS UNTIL     1045am  . PLEASE FINISH ENSURE DRINK PER SURGEON ORDER  WHICH NEEDS TO BE COMPLETED AT   1045am    .      CLEAR LIQUID DIET   Foods Allowed                                                                    Coffee and tea, regular and decaf                            Fruit ices (not with fruit pulp)                                      Iced Popsicles                                    Carbonated beverages, regular and diet                                    Cranberry, grape and apple juices Sports drinks like Gatorade Lightly seasoned clear broth or consume(fat free) Sugar, honey syrup ___________________________________________________________________      BRUSH YOUR TEETH MORNING OF SURGERY AND RINSE YOUR MOUTH OUT, NO CHEWING GUM CANDY OR MINTS.     Take these medicines the morning of surgery with A SIP OF WATER:  Inhalers as usual and bring    DO NOT TAKE ANY DIABETIC MEDICATIONS DAY OF YOUR SURGERY                               You may not have any metal on your body including hair pins and              piercings  Do not wear jewelry, make-up, lotions, powders or perfumes, deodorant             Do not wear nail polish on your fingernails.  Do not shave  48 hours prior to surgery.              Men may shave face and neck.   Do not bring valuables  to the hospital. [place].  Contacts, dentures or bridgework may not be worn into surgery.  Leave suitcase in the car. After surgery it may be brought to your room.     Patients discharged the day of surgery will not be allowed to drive home. IF YOU ARE HAVING SURGERY AND GOING HOME THE SAME DAY, YOU MUST HAVE AN ADULT TO DRIVE YOU HOME AND BE WITH YOU FOR 24 HOURS. YOU MAY GO HOME BY TAXI OR UBER OR ORTHERWISE, BUT AN ADULT MUST ACCOMPANY YOU HOME AND STAY WITH YOU FOR 24 HOURS.  Name and phone number of your driver:  Special Instructions: N/A              Please read over the following fact sheets you were given: _____________________________________________________________________  [facility] - Preparing for Surgery Before surgery, you can play an important role.  Because skin is not sterile, your skin needs to be as free of germs as possible.  You can reduce the number of germs on your skin by washing with CHG (chlorahexidine gluconate) soap before surgery.  CHG is an antiseptic cleaner which kills germs and bonds with the skin to continue killing germs even after washing. Please DO NOT use if you have an allergy to CHG or antibacterial soaps.  If your skin becomes reddened/irritated stop using the CHG and inform your nurse when you arrive at Short Stay. Do not shave (including legs and underarms) for at least 48 hours prior to the first CHG shower.  You may shave your face/neck. Please follow these instructions carefully:  1.  Shower with CHG Soap the night before surgery and the  morning of Surgery.  2.  If you choose to wash your hair, wash your hair first as usual with your  normal  shampoo.  3.  After you shampoo, rinse your hair and body thoroughly to remove the  shampoo.                           4.  Use CHG as you would any other liquid soap.  You can apply chg directly  to the skin and wash                       Gently with a scrungie or clean washcloth.  5.  Apply the CHG  Soap to your body ONLY FROM THE NECK DOWN.   Do not use on face/ open                           Wound or open sores. Avoid contact with eyes, ears mouth and genitals (private parts).                       Wash face,  Genitals (private parts) with your normal soap.             6.  Wash thoroughly, paying special attention to the area where your surgery  will be performed.  7.  Thoroughly rinse your body with warm water from the neck down.  8.  DO NOT shower/wash with your normal soap after using and rinsing off  the CHG Soap.                9.  Pat yourself dry with a clean towel.            10.  Wear clean pajamas.            11.  Place clean sheets on your bed the night of your first shower and do not  sleep with pets. Day of Surgery : Do not apply any lotions/deodorants the morning of surgery.  Please wear clean clothes to the hospital/surgery center.  FAILURE TO FOLLOW THESE INSTRUCTIONS MAY RESULT IN THE CANCELLATION OF YOUR SURGERY PATIENT SIGNATURE_________________________________  NURSE SIGNATURE__________________________________  ________________________________________________________________________

## 2021-09-20 NOTE — Progress Notes (Addendum)
Anesthesia Review:  PCP: Vanita Panda  Cardiologist : 08/25/21- preop- God[facility] Pick Tobb  Pulm- Dr Ander Slade-  preop clearamce pm 09/09/21  Chest x-ray : 2v- 08/17/21  EKG :08/25/21  Ctcors- 08/18/21  Echo : Stress test: Cardiac Cath :  Activity level: can do a flight of stairs without difficulty  Sleep Study/ CPAP : has cpap to brin mask and tubing  Fasting Blood Sugar :      / Checks Blood Sugar -- times a day:   Blood Thinner/ Instructions /Last Dose: ASA / Instructions/ Last Dose :   08/18/21-hgba1c- 5.7  81 mg aspirin  Prediabetes - does not hceck glucose at home  Covid test on 2/2/ at am  Glucose was 69 on preop appt .  PT did not eat breakfast prior to appt.  PT asymptomatic .  4 ounces of orange juice and peanut butter crackers given.  Rechecked glucose 15 minutes later and pt was 81.  Daughter with pt at preop.  Instructed pt once preop appt completed to go eat breakfast.  [place] [employer] upon departure from preop.

## 2021-09-22 ENCOUNTER — Encounter (HOSPITAL_COMMUNITY)
Admission: RE | Admit: 2021-09-22 | Discharge: 2021-09-22 | Disposition: A | Discharge status: Home / Self Care | Payer: Medicare HMO | Source: Ambulatory Visit | Attending: Orthopedic Surgery | Admitting: Orthopedic Surgery

## 2021-09-22 ENCOUNTER — Encounter (HOSPITAL_COMMUNITY): Payer: Self-pay

## 2021-09-22 ENCOUNTER — Other Ambulatory Visit: Payer: Self-pay

## 2021-09-22 VITALS — BP 144/83 | HR 71 | Temp 98.6°F | Resp 16 | Ht 61.0 in | Wt 192.0 lb

## 2021-09-22 DIAGNOSIS — I509 Heart failure, unspecified: Secondary | ICD-10-CM | POA: Insufficient documentation

## 2021-09-22 DIAGNOSIS — I110 Hypertensive heart disease with heart failure: Secondary | ICD-10-CM | POA: Insufficient documentation

## 2021-09-22 DIAGNOSIS — G4730 Sleep apnea, unspecified: Secondary | ICD-10-CM | POA: Diagnosis not present

## 2021-09-22 DIAGNOSIS — J449 Chronic obstructive pulmonary disease, unspecified: Secondary | ICD-10-CM | POA: Insufficient documentation

## 2021-09-22 DIAGNOSIS — M1712 Unilateral primary osteoarthritis, left knee: Secondary | ICD-10-CM | POA: Diagnosis not present

## 2021-09-22 DIAGNOSIS — Z01812 Encounter for preprocedural laboratory examination: Secondary | ICD-10-CM | POA: Insufficient documentation

## 2021-09-22 DIAGNOSIS — Z87891 Personal history of nicotine dependence: Secondary | ICD-10-CM | POA: Diagnosis not present

## 2021-09-22 DIAGNOSIS — Z01818 Encounter for other preprocedural examination: Secondary | ICD-10-CM

## 2021-09-22 HISTORY — DX: Unspecified osteoarthritis, unspecified site: M19.90

## 2021-09-22 HISTORY — DX: Gastro-esophageal reflux disease without esophagitis: K21.9

## 2021-09-22 HISTORY — DX: Peripheral vascular disease, unspecified: I73.9

## 2021-09-22 HISTORY — DX: Sleep apnea, unspecified: G47.30

## 2021-09-22 HISTORY — DX: Prediabetes: R73.03

## 2021-09-22 HISTORY — DX: Pneumonia, unspecified organism: J18.9

## 2021-09-22 HISTORY — DX: Other complications of anesthesia, initial encounter: T88.59XA

## 2021-09-22 HISTORY — DX: Headache, unspecified: R51.9

## 2021-09-22 LAB — COMPREHENSIVE METABOLIC PANEL
ALT: 22 U/L (ref 0–44)
AST: 24 U/L (ref 15–41)
Albumin: 4.3 g/dL (ref 3.5–5.0)
Alkaline Phosphatase: 87 U/L (ref 38–126)
Anion gap: 8 (ref 5–15)
BUN: 15 mg/dL (ref 8–23)
CO2: 26 mmol/L (ref 22–32)
Calcium: 9.8 mg/dL (ref 8.9–10.3)
Chloride: 104 mmol/L (ref 98–111)
Creatinine, Ser: 0.82 mg/dL (ref 0.44–1.00)
GFR, Estimated: >60 mL/min (ref >60)
Glucose, Bld: 75 mg/dL (ref 70–99)
Potassium: 3.5 mmol/L (ref 3.5–5.1)
Sodium: 138 mmol/L (ref 135–145)
Total Bilirubin: 1.3 mg/dL — ABNORMAL HIGH (ref 0.3–1.2)
Total Protein: 7.3 g/dL (ref 6.5–8.1)

## 2021-09-22 LAB — CBC
HCT: 43.3 % (ref 36.0–46.0)
Hemoglobin: 14.2 g/dL (ref 12.0–15.0)
MCH: 29.6 pg (ref 26.0–34.0)
MCHC: 32.8 g/dL (ref 30.0–36.0)
MCV: 90.4 fL (ref 80.0–100.0)
Platelets: 310 10*3/uL (ref 150–400)
RBC: 4.79 MIL/uL (ref 3.87–5.11)
RDW: 12.9 % (ref 11.5–15.5)
WBC: 5.7 10*3/uL (ref 4.0–10.5)
nRBC: 0 % (ref 0.0–0.2)

## 2021-09-22 LAB — GLUCOSE, CAPILLARY
Glucose-Capillary: 69 mg/dL — ABNORMAL LOW (ref 70–99)
Glucose-Capillary: 81 mg/dL (ref 70–99)

## 2021-09-22 LAB — TYPE AND SCREEN
ABO/RH(D): B POS
Antibody Screen: NEGATIVE

## 2021-09-22 LAB — PROTIME-INR
INR: 1 (ref 0.8–1.2)
Prothrombin Time: 13 seconds (ref 11.4–15.2)

## 2021-09-22 LAB — SURGICAL PCR SCREEN
MRSA, PCR: NEGATIVE
Staphylococcus aureus: NEGATIVE

## 2021-09-22 NOTE — H&P (Signed)
TOTAL KNEE ADMISSION H&P  Patient is being admitted for left total knee arthroplasty.  Subjective:  Chief Complaint: Left knee pain.  HPI: Chirstina Luna, 61 y.o. female has a history of pain and functional disability in the left knee due to arthritis and has failed non-surgical conservative treatments for greater than 12 weeks to include NSAID's and/or analgesics, flexibility and strengthening excercises, and activity modification. Onset of symptoms was gradual, starting  several  years ago with gradually worsening course since that time. The patient noted no past surgery on the left knee.  Patient currently rates pain in the left knee at 7 out of 10 with activity. Patient has worsening of pain with activity and weight bearing, pain that interferes with activities of daily living, pain with passive range of motion, and crepitus. Patient has evidence of periarticular osteophytes and joint space narrowing by imaging studies. There is no active infection.  Patient Active Problem List   Diagnosis Date Noted   Allergic contact dermatitis 09/12/2021   Preop cardiovascular exam 08/25/2021   Primary hypertension 08/25/2021   Mixed hyperlipidemia 08/25/2021   Obesity (BMI 30-39.9) 08/25/2021   Musculoskeletal chest pain 08/25/2021   Hypertension 08/18/2021   Morbid obesity ([place]) 08/18/2021   BMI 37.0-37.9, adult 08/18/2021   Obstructive sleep apnea 08/18/2021   Memory difficulties 08/18/2021   Grief 08/18/2021   Primary osteoarthritis of left knee 05/17/2021   CPAP (continuous positive airway pressure) dependence 11/10/2020   Osteoarthritis of right knee 11/10/2020   Personal history of COVID-19 07/20/2020   Atherosclerosis of native arteries of extremities with intermittent claudication, right leg ([place]) 08/11/2019   Palpitations 01/06/2019   Laryngopharyngeal reflux 08/14/2018   High cholesterol 02/11/2016   COPD (chronic obstructive pulmonary disease) ([place]) 02/11/2016   Tobacco use 02/11/2016    Chest pain 05/07/2010   Edema of both legs 04/26/2010    Past Medical History:  Diagnosis Date   Acute exacerbation of chronic obstructive airways disease ([place]) 11/09/2017   Hospitalization 11/09/17 in [place]   Arthritis    Asthma    Bell's palsy 11/10/2020   hx of bewlls palsy   Complication of anesthesia    hysterectomy - slow to wake up   Congestive heart failure ([place]) 04/26/2010   Echo 11/13/20 in [place] showed normal ejection fraction, normal echo.    GERD (gastroesophageal reflux disease)    Headache    Hyperlipidemia 08/18/2021   Hypertension    Meningioma ([place]) 04/23/2010   01/28/13 Head CT for headaches: A left posterior parafalcine meningioma is seen that measures 1.5 cm,  previously measuring 1.3 cm. Basal cisterns and sulci are unremarkable. Impression:  Left posterior parafalcine meningioma is mildly increased in size since  prior without significant surrounding mass effect or acute process visualized.   Acute or subacute ischemia is not excluded with CT.    Peripheral vascular disease ([place])    Pneumonia    Pre-diabetes    Restrictive lung disease 03/05/2018   Sleep apnea     Past Surgical History:  Procedure Laterality Date   ABDOMINAL HYSTERECTOMY     APPENDECTOMY     meniscus repair on right knee      TONSILLECTOMY      Prior to Admission medications   Medication Sig Start Date End Date Taking? Authorizing Provider  acetaminophen (TYLENOL) 325 MG tablet Take 325-650 mg by mouth every 6 (six) hours as needed for moderate pain.   Yes [provider]  albuterol (VENTOLIN HFA) 108 (90  Base) MCG/ACT inhaler Inhale 1-2 puffs into the lungs every 6 (six) hours as needed for wheezing or shortness of breath.   Yes [provider]  aspirin 81 MG EC tablet Take 1 tablet (81 mg total) by mouth daily. Swallow whole. 08/19/21  Yes Erskine Emery, MD  buPROPion (WELLBUTRIN SR) 150 MG 12 hr tablet Take 150 mg by mouth 2 (two) times  daily.   Yes [provider]  diclofenac (VOLTAREN) 75 MG EC tablet Take 75 mg by mouth 2 (two) times daily.   Yes [provider]  fluticasone (FLOVENT HFA) 220 MCG/ACT inhaler Inhale 2 puffs into the lungs 2 (two) times daily.   Yes [provider]  ibandronate (BONIVA) 150 MG tablet Take 150 mg by mouth every 30 (thirty) days.   Yes [provider]  lidocaine (LIDODERM) 5 % Place 1 patch onto the skin daily as needed (pain). Remove & Discard patch within 12 hours or as directed by MD   Yes [provider]  loratadine (CLARITIN) 10 MG tablet Take 10 mg by mouth daily as needed for allergies.   Yes [provider]  losartan-hydrochlorothiazide (HYZAAR) 100-25 MG tablet Take 1 tablet by mouth daily.   Yes [provider]  Multiple Vitamins-Minerals (MULTIVITAMIN WITH MINERALS) tablet Take 1 tablet by mouth daily.   Yes [provider]  naproxen (NAPROSYN) 500 MG tablet Take 500 mg by mouth 2 (two) times daily as needed for moderate pain.   Yes [provider]  nitroGLYCERIN (NITROSTAT) 0.4 MG SL tablet Place 0.4 mg under the tongue every 5 (five) minutes as needed for chest pain.   Yes [provider]  omeprazole (PRILOSEC) 20 MG capsule Take 20 mg by mouth daily. 11/05/20  Yes [provider]  rosuvastatin (CRESTOR) 5 MG tablet Take 5 mg by mouth daily.   Yes [provider]  Vitamin D, Ergocalciferol, (DRISDOL) 1.25 MG (50000 UNIT) CAPS capsule Take 50,000 Units by mouth every Sunday. 03/09/21  Yes [provider]  nicotine (NICODERM CQ - DOSED IN MG/24 HR) 7 mg/24hr patch Place 1 patch (7 mg total) onto the skin daily. 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  Tobacco Use   Smoking status: Former    Packs/day: 1.00    Years: 40.00    Pack years: 40.00    Types: Cigarettes    Quit date: 08/28/2021    Years since quitting: 0.0   Smokeless tobacco: Never  Vaping Use   Vaping Use: Never used  Substance and Sexual Activity   Alcohol use: Yes    Comment: rare   Drug use: Never   Sexual activity: Not Currently  Other Topics Concern   Not on file  Social History Narrative   Not on file   Social Determinants of Health   Financial Resource Strain: Not on file  Food Insecurity: Not on file  Transportation Needs: Not on file  Physical Activity: Not on file  Stress: Not on file  Social Connections: Not on file  Intimate Partner  Violence: Not on file    Tobacco Use: Medium Risk   Smoking Tobacco Use: Former   Smokeless Tobacco Use: Never   Passive Exposure: Not on file   Social History   Substance and Sexual Activity  Alcohol Use Yes   Comment: rare    Family History  Problem Relation Age of Onset   Dementia Father    Asthma Sister    Immunodeficiency Daughter    Asthma Daughter    Asthma Daughter    Immunodeficiency Grandson    Asthma Grandson     ROS: Constitutional: no fever, no chills, no night sweats, no significant weight loss Cardiovascular: no chest pain, no palpitations Respiratory: no cough, no shortness of breath, No COPD Gastrointestinal: no vomiting, no nausea Musculoskeletal: no swelling in Joints, Joint Pain Neurologic: no numbness, no tingling, no difficulty with balance   Objective:  Physical Exam: Well nourished and well developed.  General: Alert and oriented x3, cooperative and pleasant, no acute distress.  Head: normocephalic, atraumatic, neck supple.  Eyes: EOMI.  Respiratory: breath sounds clear in all fields, no wheezing, rales, or rhonchi. Cardiovascular:  Regular rate and rhythm, no murmurs, gallops or rubs.  Abdomen: non-tender to palpation and soft, normoactive bowel sounds. Musculoskeletal:   Bilateral Hip Exam:   The range of motion: normal without discomfort.       Right Knee Exam:   No effusion present. No swelling present.   The range of motion is: 0 to 125 degrees.   Moderate crepitus on range of motion of the knee.   No medial joint line tenderness.   No lateral joint line tenderness.   The knee is stable.       Left Knee Exam:   No effusion present. No swelling present.   Slight varus deformity.   The range of motion is: 5 to 120 degrees.   Moderate crepitus on range of motion of the knee.   Positive medial joint line tenderness.   No lateral joint line tenderness noted.   The knee is stable.   Calves soft and nontender. Motor function intact in LE. Strength 5/5 LE bilaterally. Neuro: Distal pulses 2+. Sensation to light touch intact in LE.   Vital signs in last 24 hours: Temp:  [98.6 F (37 C)] 98.6 F (37 C) (01/26 0842) Pulse Rate:  [71] 71 (01/26 0842) Resp:  [16] 16 (01/26 0842) BP: (144)/(83) 144/83 (01/26 0842) SpO2:  [99 %] 99 % (01/26 0842) Weight:  [87.1 kg] 87.1 kg (01/26 0842)  Imaging Review Radiographs- AP and lateral of the bilateral knees dated 06/16/2021 demonstrate bone-on-bone arthritis in the medial compartment on the right and medial and patellofemoral on the left.  Assessment/Plan:  End stage arthritis, left knee   The patient history, physical examination, clinical judgment of the provider and imaging studies are consistent with end stage degenerative joint disease of the left knee and total knee arthroplasty is deemed medically necessary. The treatment options including medical management, injection therapy arthroscopy and arthroplasty were discussed at length. The risks and benefits of total knee arthroplasty were presented and reviewed. The risks due to aseptic loosening, infection,  stiffness, patella tracking problems, thromboembolic complications and other imponderables were discussed. The patient acknowledged the explanation, agreed to proceed with the plan and consent was signed. Patient is being admitted for inpatient treatment for surgery, pain control, PT, OT, prophylactic antibiotics, VTE prophylaxis, progressive ambulation and ADLs and discharge planning. The patient is planning to be discharged  home .  Patient's anticipated LOS is less than 2 midnights, meeting these requirements: - Younger than 2 - Lives within 1 hour of care - Has a competent adult at home to recover with post-op recover - NO history of  - Chronic pain requiring opiods  - Diabetes  - Coronary Artery Disease  - Heart failure  - Heart attack  - Stroke  - DVT/VTE  - Cardiac arrhythmia  - Respiratory Failure/COPD  - Renal failure  - Anemia  - Advanced Liver disease    Therapy Plans: [facility] - scheduled for 10/06/2021 Disposition: Home with Daughter Planned DVT Prophylaxis: Aspirin 325mg   DME Needed: RW and 3-in-1 PCP: Ancil Boozer, NP (contacting for clearance) Cardiology: Domingo Pulse (contacting for clearance) TXA: IV Allergies: iodine (sneezing uncontrollable); Codeine (itching) Anesthesia Concerns: Difficult waking up from anesthesia following hysterectomy - Sleep Apnea (uses CPAP) BMI: 37.1 Last HgbA1c: 5.7  Pharmacy: [facility]  - Patient was instructed on what medications to stop prior to surgery. - Follow-up visit in 2 weeks with Dr. Wynelle Link - Begin physical therapy following surgery - Pre-operative lab work as pre-surgical testing - Prescriptions will be provided in hospital at time of discharge  Fenton Foy, [facility], PA-C Orthopedic Surgery [facility] [place]

## 2021-09-23 NOTE — Progress Notes (Signed)
Anesthesia Chart Review   Case: [id number] Date/Time: 10/03/21 1330   Procedure: TOTAL KNEE ARTHROPLASTY (Left: Knee)   Anesthesia type: Choice   Pre-op diagnosis: left knee osteoarthritis   Location: [facility] ROOM 09 / [facility]   Surgeons: Gaynelle Arabian, MD       DISCUSSION:61 y.o. former smoker with h/o HTN, COPD, CHF, sleep apnea, left knee OA scheduled for above procedure 10/03/2021 with Dr. Gaynelle Arabian.   Pt last seen by pulmonology. Per OV note, "She does not have any acutely reversible respiratory complaints at present and is cleared for surgery from a respiratory perspective"  Pt last seen by cardiology 08/25/2021. Per OV note, "The patient does not have any unstable cardiac conditions.  Upon evaluation today, she can achieve 4 METs or greater without anginal symptoms.  According to [facility] and AHA guidelines, she requires no further cardiac workup prior to her noncardiac surgery and should be at acceptable risk.  Our service is available as necessary in the perioperative period."  Anticipate pt can proceed with planned procedure barring acute status change.     VS: BP (!) 144/83    Pulse 71    Temp 37 C (Oral)    Resp 16    Ht 5\' 1"  (1.549 m)    Wt 87.1 kg    SpO2 99%    BMI 36.28 kg/m   PROVIDERS: Wendie Simmer, MD is PCP   Berniece Salines, DO is Cardiologist   Sherrilyn Rist, MD is Pulmonologist  LABS: Labs reviewed: Acceptable for surgery. (all labs ordered are listed, but only abnormal results are displayed)  Labs Reviewed  COMPREHENSIVE METABOLIC PANEL - Abnormal; Notable for the following components:      Result Value   Total Bilirubin 1.3 (*)    All other components within normal limits  GLUCOSE, CAPILLARY - Abnormal; Notable for the following components:   Glucose-Capillary 69 (*)    All other components within normal limits  SURGICAL PCR SCREEN  CBC  PROTIME-INR  GLUCOSE, CAPILLARY  TYPE AND SCREEN     IMAGES:   EKG: 08/25/2021 Rate 81 bpm   NSR  CV: CT Coronary 08/18/21 IMPRESSION: 1. Coronary calcium score of 0.   2. Normal coronary origin with right dominance.   3. No evidence of CAD. There are significant step artifacts due to cardiac motion, but no CAD is seen Past Medical History:  Diagnosis Date   Acute exacerbation of chronic obstructive airways disease ([place]) 11/09/2017   Hospitalization 11/09/17 in [place] palsy 11/10/2020   hx of bewlls palsy   Complication of anesthesia    hysterectomy - slow to wake up   Congestive heart failure ([place]) 04/26/2010   Echo 11/13/20 in [place] showed normal ejection fraction, normal echo.    GERD (gastroesophageal reflux disease)    Headache    Hyperlipidemia 08/18/2021   Hypertension    Meningioma ([place]) 04/23/2010   01/28/13 Head CT for headaches: A left posterior parafalcine meningioma is seen that measures 1.5 cm,  previously measuring 1.3 cm. Basal cisterns and sulci are unremarkable. Impression:  Left posterior parafalcine meningioma is mildly increased in size since  prior without significant surrounding mass effect or acute process visualized.   Acute or subacute ischemia is not excluded with CT.    Peripheral vascular disease ([place])    Pneumonia    Pre-diabetes    Restrictive lung disease 03/05/2018   Sleep apnea  Past Surgical History:  Procedure Laterality Date   ABDOMINAL HYSTERECTOMY     APPENDECTOMY     meniscus repair on right knee      TONSILLECTOMY      MEDICATIONS:  acetaminophen (TYLENOL) 325 MG tablet   albuterol (VENTOLIN HFA) 108 (90 Base) MCG/ACT inhaler   aspirin 81 MG EC tablet   buPROPion (WELLBUTRIN SR) 150 MG 12 hr tablet   diclofenac (VOLTAREN) 75 MG EC tablet   fluticasone (FLOVENT HFA) 220 MCG/ACT inhaler   ibandronate (BONIVA) 150 MG tablet   lidocaine (LIDODERM) 5 %   loratadine (CLARITIN) 10 MG tablet   losartan-hydrochlorothiazide (HYZAAR) 100-25 MG tablet   Multiple  Vitamins-Minerals (MULTIVITAMIN WITH MINERALS) tablet   naproxen (NAPROSYN) 500 MG tablet   nicotine (NICODERM CQ - DOSED IN MG/24 HR) 7 mg/24hr patch   nitroGLYCERIN (NITROSTAT) 0.4 MG SL tablet   omeprazole (PRILOSEC) 20 MG capsule   rosuvastatin (CRESTOR) 5 MG tablet   Vitamin D, Ergocalciferol, (DRISDOL) 1.25 MG (50000 UNIT) CAPS capsule   No current facility-administered medications for this encounter.    Konrad Felix Ward, PA-C [facility] Pre-Surgical Testing [phone]

## 2021-09-23 NOTE — Anesthesia Preprocedure Evaluation (Addendum)
Anesthesia Evaluation  Patient identified by MRN, date of birth, ID band Patient awake    Reviewed: Allergy & Precautions, NPO status , Patient's Chart, lab work & pertinent test results  Airway Mallampati: II  TM Distance: >3 FB Neck ROM: Full    Dental no notable dental hx.    Pulmonary asthma , sleep apnea , COPD, former smoker,    Pulmonary exam normal breath sounds clear to auscultation       Cardiovascular hypertension, Pt. on medications + Peripheral Vascular Disease and +CHF  Normal cardiovascular exam Rhythm:Regular Rate:Normal     Neuro/Psych  Headaches, negative psych ROS   GI/Hepatic Neg liver ROS, GERD  ,  Endo/Other  negative endocrine ROS  Renal/GU negative Renal ROS  negative genitourinary   Musculoskeletal  (+) Arthritis , Osteoarthritis,    Abdominal (+) + obese,   Peds negative pediatric ROS (+)  Hematology negative hematology ROS (+)   Anesthesia Other Findings   Reproductive/Obstetrics negative OB ROS                            Anesthesia Physical Anesthesia Plan  ASA: 3  Anesthesia Plan: Spinal   Post-op Pain Management: Regional block   Induction: Intravenous  PONV Risk Score and Plan: 2 and Ondansetron, Midazolam and Treatment may vary due to age or medical condition  Airway Management Planned: Simple Face Mask  Additional Equipment:   Intra-op Plan:   Post-operative Plan:   Informed Consent: I have reviewed the patients History and Physical, chart, labs and discussed the procedure including the risks, benefits and alternatives for the proposed anesthesia with the patient or authorized representative who has indicated his/her understanding and acceptance.     Dental advisory given  Plan Discussed with: CRNA  Anesthesia Plan Comments: (See PAT note 09/22/2021, Konrad Felix Ward, PA-C)       Anesthesia Quick Evaluation

## 2021-09-29 ENCOUNTER — Other Ambulatory Visit: Payer: Self-pay

## 2021-09-29 ENCOUNTER — Encounter (HOSPITAL_COMMUNITY)
Admission: RE | Admit: 2021-09-29 | Discharge: 2021-09-29 | Disposition: A | Discharge status: Home / Self Care | Payer: Medicare HMO | Source: Ambulatory Visit | Attending: Orthopedic Surgery | Admitting: Orthopedic Surgery

## 2021-09-29 DIAGNOSIS — Z01812 Encounter for preprocedural laboratory examination: Secondary | ICD-10-CM | POA: Insufficient documentation

## 2021-09-29 DIAGNOSIS — Z20822 Contact with and (suspected) exposure to covid-19: Secondary | ICD-10-CM | POA: Diagnosis not present

## 2021-09-29 DIAGNOSIS — Z01818 Encounter for other preprocedural examination: Secondary | ICD-10-CM

## 2021-09-29 LAB — SARS CORONAVIRUS 2 (TAT 6-24 HRS): SARS Coronavirus 2: NEGATIVE

## 2021-10-03 ENCOUNTER — Encounter (HOSPITAL_COMMUNITY): Payer: Self-pay | Admitting: Orthopedic Surgery

## 2021-10-03 ENCOUNTER — Other Ambulatory Visit: Payer: Self-pay

## 2021-10-03 ENCOUNTER — Ambulatory Visit (HOSPITAL_COMMUNITY): Payer: Medicare HMO | Admitting: Physician Assistant

## 2021-10-03 ENCOUNTER — Encounter (HOSPITAL_COMMUNITY)
Admission: RE | Disposition: A | Discharge status: Home / Self Care | Payer: Self-pay | Source: Ambulatory Visit | Attending: Orthopedic Surgery

## 2021-10-03 ENCOUNTER — Ambulatory Visit (HOSPITAL_COMMUNITY): Payer: Medicare HMO | Admitting: Certified Registered"

## 2021-10-03 ENCOUNTER — Observation Stay (HOSPITAL_COMMUNITY)
Admission: RE | Admit: 2021-10-03 | Discharge: 2021-10-04 | Disposition: A | Discharge status: Home / Self Care | Payer: Medicare HMO | Source: Ambulatory Visit | Attending: Orthopedic Surgery | Admitting: Orthopedic Surgery

## 2021-10-03 DIAGNOSIS — M1712 Unilateral primary osteoarthritis, left knee: Secondary | ICD-10-CM

## 2021-10-03 DIAGNOSIS — I110 Hypertensive heart disease with heart failure: Secondary | ICD-10-CM | POA: Diagnosis not present

## 2021-10-03 DIAGNOSIS — I509 Heart failure, unspecified: Secondary | ICD-10-CM | POA: Diagnosis not present

## 2021-10-03 DIAGNOSIS — J45909 Unspecified asthma, uncomplicated: Secondary | ICD-10-CM | POA: Insufficient documentation

## 2021-10-03 DIAGNOSIS — Z8616 Personal history of COVID-19: Secondary | ICD-10-CM | POA: Insufficient documentation

## 2021-10-03 DIAGNOSIS — J449 Chronic obstructive pulmonary disease, unspecified: Secondary | ICD-10-CM | POA: Insufficient documentation

## 2021-10-03 DIAGNOSIS — Z87891 Personal history of nicotine dependence: Secondary | ICD-10-CM | POA: Insufficient documentation

## 2021-10-03 DIAGNOSIS — Z7982 Long term (current) use of aspirin: Secondary | ICD-10-CM | POA: Diagnosis not present

## 2021-10-03 DIAGNOSIS — Z79899 Other long term (current) drug therapy: Secondary | ICD-10-CM | POA: Insufficient documentation

## 2021-10-03 DIAGNOSIS — M179 Osteoarthritis of knee, unspecified: Secondary | ICD-10-CM

## 2021-10-03 HISTORY — PX: TOTAL KNEE ARTHROPLASTY: SHX125

## 2021-10-03 HISTORY — DX: Unilateral primary osteoarthritis, left knee: M17.12

## 2021-10-03 LAB — ABO/RH: ABO/RH(D): B POS

## 2021-10-03 SURGERY — ARTHROPLASTY, KNEE, TOTAL
Anesthesia: Spinal | Site: Knee | Laterality: Left

## 2021-10-03 MED ORDER — BUDESONIDE 0.5 MG/2ML IN SUSP
0.5000 mg | Freq: Two times a day (BID) | RESPIRATORY_TRACT | Status: DC
  Administered 2021-10-03 – 2021-10-04 (×2): 0.5 mg via RESPIRATORY_TRACT
  Filled 2021-10-03 (×2): qty 2

## 2021-10-03 MED ORDER — ASPIRIN EC 325 MG PO TBEC
325.0000 mg | DELAYED_RELEASE_TABLET | Freq: Two times a day (BID) | ORAL | Status: DC
  Administered 2021-10-04: 325 mg via ORAL
  Filled 2021-10-03: qty 1

## 2021-10-03 MED ORDER — ONDANSETRON HCL 4 MG PO TABS: 4.0000 mg | ORAL_TABLET | Freq: Four times a day (QID) | ORAL | Status: DC | PRN

## 2021-10-03 MED ORDER — DEXAMETHASONE SODIUM PHOSPHATE 10 MG/ML IJ SOLN
8.0000 mg | Freq: Once | INTRAMUSCULAR | Status: AC
  Administered 2021-10-03: 8 mg via INTRAVENOUS

## 2021-10-03 MED ORDER — CEFAZOLIN SODIUM-DEXTROSE 2-4 GM/100ML-% IV SOLN
2.0000 g | INTRAVENOUS | Status: AC
  Administered 2021-10-03: 2 g via INTRAVENOUS
  Filled 2021-10-03: qty 100

## 2021-10-03 MED ORDER — OXYCODONE HCL 5 MG PO TABS: 5.0000 mg | ORAL_TABLET | Freq: Once | ORAL | Status: DC | PRN

## 2021-10-03 MED ORDER — SODIUM CHLORIDE (PF) 0.9 % IJ SOLN
INTRAMUSCULAR | Status: DC | PRN
  Administered 2021-10-03: 60 mL

## 2021-10-03 MED ORDER — BUPROPION HCL ER (SR) 150 MG PO TB12
150.0000 mg | ORAL_TABLET | Freq: Two times a day (BID) | ORAL | Status: DC
  Administered 2021-10-03 – 2021-10-04 (×2): 150 mg via ORAL
  Filled 2021-10-03 (×2): qty 1

## 2021-10-03 MED ORDER — POLYETHYLENE GLYCOL 3350 17 G PO PACK: 17.0000 g | PACK | Freq: Every day | ORAL | Status: DC | PRN

## 2021-10-03 MED ORDER — CHLORHEXIDINE GLUCONATE CLOTH 2 % EX PADS: 6.0000 | MEDICATED_PAD | Freq: Every day | CUTANEOUS | Status: DC

## 2021-10-03 MED ORDER — PROPOFOL 1000 MG/100ML IV EMUL
INTRAVENOUS | Status: AC
  Filled 2021-10-03: qty 100

## 2021-10-03 MED ORDER — TRANEXAMIC ACID-NACL 1000-0.7 MG/100ML-% IV SOLN
1000.0000 mg | INTRAVENOUS | Status: AC
Start: 2021-10-03 — End: 2021-10-03
  Administered 2021-10-03: 1000 mg via INTRAVENOUS
  Filled 2021-10-03: qty 100

## 2021-10-03 MED ORDER — EPHEDRINE SULFATE-NACL 50-0.9 MG/10ML-% IV SOSY
PREFILLED_SYRINGE | INTRAVENOUS | Status: DC | PRN
  Administered 2021-10-03 (×2): 5 mg via INTRAVENOUS
  Administered 2021-10-03: 10 mg via INTRAVENOUS
  Administered 2021-10-03: 5 mg via INTRAVENOUS

## 2021-10-03 MED ORDER — METOCLOPRAMIDE HCL 5 MG/ML IJ SOLN: 5.0000 mg | Freq: Three times a day (TID) | INTRAMUSCULAR | Status: DC | PRN

## 2021-10-03 MED ORDER — SODIUM CHLORIDE 0.9 % IR SOLN
Status: DC | PRN
  Administered 2021-10-03: 1000 mL

## 2021-10-03 MED ORDER — ACETAMINOPHEN 10 MG/ML IV SOLN: 1000.0000 mg | Freq: Four times a day (QID) | INTRAVENOUS | Status: DC

## 2021-10-03 MED ORDER — HYDROMORPHONE HCL 1 MG/ML IJ SOLN: 0.2500 mg | INTRAMUSCULAR | Status: DC | PRN

## 2021-10-03 MED ORDER — METOCLOPRAMIDE HCL 5 MG PO TABS: 5.0000 mg | ORAL_TABLET | Freq: Three times a day (TID) | ORAL | Status: DC | PRN

## 2021-10-03 MED ORDER — FLEET ENEMA 7-19 GM/118ML RE ENEM: 1.0000 | ENEMA | Freq: Once | RECTAL | Status: DC | PRN

## 2021-10-03 MED ORDER — ROPIVACAINE HCL 5 MG/ML IJ SOLN
INTRAMUSCULAR | Status: DC | PRN
  Administered 2021-10-03: 20 mL via PERINEURAL

## 2021-10-03 MED ORDER — PANTOPRAZOLE SODIUM 40 MG PO TBEC
40.0000 mg | DELAYED_RELEASE_TABLET | Freq: Every day | ORAL | Status: DC
  Administered 2021-10-04: 40 mg via ORAL
  Filled 2021-10-03: qty 1

## 2021-10-03 MED ORDER — PROPOFOL 10 MG/ML IV BOLUS
INTRAVENOUS | Status: DC | PRN
  Administered 2021-10-03 (×2): 20 mg via INTRAVENOUS

## 2021-10-03 MED ORDER — ROSUVASTATIN CALCIUM 5 MG PO TABS
5.0000 mg | ORAL_TABLET | Freq: Every day | ORAL | Status: DC
  Administered 2021-10-04: 5 mg via ORAL
  Filled 2021-10-03: qty 1

## 2021-10-03 MED ORDER — ACETAMINOPHEN 500 MG PO TABS
1000.0000 mg | ORAL_TABLET | Freq: Four times a day (QID) | ORAL | Status: DC
  Administered 2021-10-03 – 2021-10-04 (×3): 1000 mg via ORAL
  Filled 2021-10-03 (×3): qty 2

## 2021-10-03 MED ORDER — DEXAMETHASONE SODIUM PHOSPHATE 10 MG/ML IJ SOLN
INTRAMUSCULAR | Status: AC
  Filled 2021-10-03: qty 1

## 2021-10-03 MED ORDER — CEFAZOLIN SODIUM-DEXTROSE 2-4 GM/100ML-% IV SOLN
2.0000 g | Freq: Four times a day (QID) | INTRAVENOUS | Status: AC
  Administered 2021-10-03 (×2): 2 g via INTRAVENOUS
  Filled 2021-10-03 (×2): qty 100

## 2021-10-03 MED ORDER — METHOCARBAMOL 500 MG PO TABS
500.0000 mg | ORAL_TABLET | Freq: Four times a day (QID) | ORAL | Status: DC | PRN
  Administered 2021-10-03 – 2021-10-04 (×3): 500 mg via ORAL
  Filled 2021-10-03 (×3): qty 1

## 2021-10-03 MED ORDER — PHENOL 1.4 % MT LIQD: 1.0000 | OROMUCOSAL | Status: DC | PRN

## 2021-10-03 MED ORDER — PROPOFOL 500 MG/50ML IV EMUL
INTRAVENOUS | Status: DC | PRN
  Administered 2021-10-03: 100 ug/kg/min via INTRAVENOUS

## 2021-10-03 MED ORDER — DIPHENHYDRAMINE HCL 12.5 MG/5ML PO ELIX: 12.5000 mg | ORAL_SOLUTION | ORAL | Status: DC | PRN

## 2021-10-03 MED ORDER — LACTATED RINGERS IV SOLN: INTRAVENOUS | Status: DC

## 2021-10-03 MED ORDER — ACETAMINOPHEN 10 MG/ML IV SOLN
INTRAVENOUS | Status: DC | PRN
  Administered 2021-10-03: 1000 mg via INTRAVENOUS

## 2021-10-03 MED ORDER — PROMETHAZINE HCL 25 MG/ML IJ SOLN: 6.2500 mg | INTRAMUSCULAR | Status: DC | PRN

## 2021-10-03 MED ORDER — GABAPENTIN 300 MG PO CAPS
300.0000 mg | ORAL_CAPSULE | Freq: Three times a day (TID) | ORAL | Status: DC
  Administered 2021-10-03 – 2021-10-04 (×3): 300 mg via ORAL
  Filled 2021-10-03 (×3): qty 1

## 2021-10-03 MED ORDER — MENTHOL 3 MG MT LOZG: 1.0000 | LOZENGE | OROMUCOSAL | Status: DC | PRN

## 2021-10-03 MED ORDER — BUPIVACAINE LIPOSOME 1.3 % IJ SUSP
INTRAMUSCULAR | Status: AC
  Filled 2021-10-03: qty 20

## 2021-10-03 MED ORDER — BISACODYL 10 MG RE SUPP: 10.0000 mg | Freq: Every day | RECTAL | Status: DC | PRN

## 2021-10-03 MED ORDER — PHENYLEPHRINE 40 MCG/ML (10ML) SYRINGE FOR IV PUSH (FOR BLOOD PRESSURE SUPPORT)
PREFILLED_SYRINGE | INTRAVENOUS | Status: DC | PRN
  Administered 2021-10-03 (×4): 80 ug via INTRAVENOUS

## 2021-10-03 MED ORDER — METHOCARBAMOL 1000 MG/10ML IJ SOLN
500.0000 mg | Freq: Four times a day (QID) | INTRAVENOUS | Status: DC | PRN
  Filled 2021-10-03: qty 5

## 2021-10-03 MED ORDER — ALBUTEROL SULFATE (2.5 MG/3ML) 0.083% IN NEBU: 2.5000 mg | INHALATION_SOLUTION | Freq: Four times a day (QID) | RESPIRATORY_TRACT | Status: DC | PRN

## 2021-10-03 MED ORDER — MORPHINE SULFATE (PF) 2 MG/ML IV SOLN
0.5000 mg | INTRAVENOUS | Status: DC | PRN
  Administered 2021-10-03 – 2021-10-04 (×2): 1 mg via INTRAVENOUS
  Filled 2021-10-03 (×2): qty 1

## 2021-10-03 MED ORDER — SODIUM CHLORIDE (PF) 0.9 % IJ SOLN
INTRAMUSCULAR | Status: AC
  Filled 2021-10-03: qty 10

## 2021-10-03 MED ORDER — BUPIVACAINE IN DEXTROSE 0.75-8.25 % IT SOLN
INTRATHECAL | Status: DC | PRN
  Administered 2021-10-03: 1.6 mL via INTRATHECAL

## 2021-10-03 MED ORDER — LOSARTAN POTASSIUM 50 MG PO TABS
100.0000 mg | ORAL_TABLET | Freq: Every day | ORAL | Status: DC
  Administered 2021-10-04: 100 mg via ORAL
  Filled 2021-10-03: qty 2

## 2021-10-03 MED ORDER — DOCUSATE SODIUM 100 MG PO CAPS
100.0000 mg | ORAL_CAPSULE | Freq: Two times a day (BID) | ORAL | Status: DC
  Administered 2021-10-03 – 2021-10-04 (×3): 100 mg via ORAL
  Filled 2021-10-03 (×3): qty 1

## 2021-10-03 MED ORDER — TRAMADOL HCL 50 MG PO TABS
50.0000 mg | ORAL_TABLET | Freq: Four times a day (QID) | ORAL | Status: DC | PRN
  Administered 2021-10-03 – 2021-10-04 (×2): 100 mg via ORAL
  Filled 2021-10-03 (×2): qty 2

## 2021-10-03 MED ORDER — BUPIVACAINE LIPOSOME 1.3 % IJ SUSP
INTRAMUSCULAR | Status: DC | PRN
  Administered 2021-10-03: 20 mL

## 2021-10-03 MED ORDER — ONDANSETRON HCL 4 MG/2ML IJ SOLN: 4.0000 mg | Freq: Four times a day (QID) | INTRAMUSCULAR | Status: DC | PRN

## 2021-10-03 MED ORDER — BUPIVACAINE LIPOSOME 1.3 % IJ SUSP: 20.0000 mL | Freq: Once | INTRAMUSCULAR | Status: DC

## 2021-10-03 MED ORDER — HYDROMORPHONE HCL 2 MG PO TABS
2.0000 mg | ORAL_TABLET | ORAL | Status: DC | PRN
  Administered 2021-10-03: 2 mg via ORAL
  Administered 2021-10-03 – 2021-10-04 (×2): 4 mg via ORAL
  Filled 2021-10-03 (×2): qty 2
  Filled 2021-10-03: qty 1

## 2021-10-03 MED ORDER — MIDAZOLAM HCL 2 MG/2ML IJ SOLN
1.0000 mg | INTRAMUSCULAR | Status: AC
  Administered 2021-10-03: 2 mg via INTRAVENOUS
  Filled 2021-10-03: qty 2

## 2021-10-03 MED ORDER — ONDANSETRON HCL 4 MG/2ML IJ SOLN
INTRAMUSCULAR | Status: DC | PRN
  Administered 2021-10-03: 4 mg via INTRAVENOUS

## 2021-10-03 MED ORDER — DEXAMETHASONE SODIUM PHOSPHATE 10 MG/ML IJ SOLN
10.0000 mg | Freq: Once | INTRAMUSCULAR | Status: AC
  Administered 2021-10-04: 10 mg via INTRAVENOUS
  Filled 2021-10-03: qty 1

## 2021-10-03 MED ORDER — SODIUM CHLORIDE 0.9 % IV SOLN: INTRAVENOUS | Status: DC

## 2021-10-03 MED ORDER — FENTANYL CITRATE PF 50 MCG/ML IJ SOSY
50.0000 ug | PREFILLED_SYRINGE | INTRAMUSCULAR | Status: AC
  Administered 2021-10-03: 100 ug via INTRAVENOUS
  Filled 2021-10-03: qty 2

## 2021-10-03 MED ORDER — ONDANSETRON HCL 4 MG/2ML IJ SOLN
INTRAMUSCULAR | Status: AC
  Filled 2021-10-03: qty 2

## 2021-10-03 MED ORDER — NITROGLYCERIN 0.4 MG SL SUBL: 0.4000 mg | SUBLINGUAL_TABLET | SUBLINGUAL | Status: DC | PRN

## 2021-10-03 MED ORDER — OXYCODONE HCL 5 MG/5ML PO SOLN: 5.0000 mg | Freq: Once | ORAL | Status: DC | PRN

## 2021-10-03 MED ORDER — HYDROCHLOROTHIAZIDE 25 MG PO TABS
25.0000 mg | ORAL_TABLET | Freq: Every day | ORAL | Status: DC
  Administered 2021-10-04: 25 mg via ORAL
  Filled 2021-10-03: qty 1

## 2021-10-03 MED ORDER — LOSARTAN POTASSIUM-HCTZ 100-25 MG PO TABS
1.0000 | ORAL_TABLET | Freq: Every day | ORAL | Status: DC
Start: 2021-10-04 — End: 2021-10-03

## 2021-10-03 SURGICAL SUPPLY — 58 items
ATTUNE PS FEM LT SZ 5 CEM KNEE (Femur) ×1 IMPLANT
BAG COUNTER SPONGE SURGICOUNT (BAG) IMPLANT
BAG SPEC THK2 15X12 ZIP CLS (MISCELLANEOUS) ×1
BAG SPNG CNTER NS LX DISP (BAG)
BAG ZIPLOCK 12X15 (MISCELLANEOUS) ×2 IMPLANT
BASEPLATE TIBIAL ROTATING SZ 4 (Knees) ×1 IMPLANT
BLADE SAG 18X100X1.27 (BLADE) ×2 IMPLANT
BLADE SAW SGTL 11.0X1.19X90.0M (BLADE) ×2 IMPLANT
BNDG CMPR MED 10X6 ELC LF (GAUZE/BANDAGES/DRESSINGS) ×1
BNDG ELASTIC 6X10 VLCR STRL LF (GAUZE/BANDAGES/DRESSINGS) ×1 IMPLANT
BNDG ELASTIC 6X5.8 VLCR STR LF (GAUZE/BANDAGES/DRESSINGS) ×2 IMPLANT
BOWL SMART MIX CTS (DISPOSABLE) ×2 IMPLANT
BSPLAT TIB 4 CMNT ROT PLAT STR (Knees) ×1 IMPLANT
CEMENT HV SMART SET (Cement) ×4 IMPLANT
COVER SURGICAL LIGHT HANDLE (MISCELLANEOUS) ×2 IMPLANT
CUFF TOURN SGL QUICK 34 (TOURNIQUET CUFF) ×2
CUFF TRNQT CYL 34X4.125X (TOURNIQUET CUFF) ×1 IMPLANT
DRAPE INCISE IOBAN 66X45 STRL (DRAPES) ×2 IMPLANT
DRAPE U-SHAPE 47X51 STRL (DRAPES) ×2 IMPLANT
DRSG AQUACEL AG ADV 3.5X10 (GAUZE/BANDAGES/DRESSINGS) ×2 IMPLANT
DURAPREP 26ML APPLICATOR (WOUND CARE) ×2 IMPLANT
ELECT REM PT RETURN 15FT ADLT (MISCELLANEOUS) ×2 IMPLANT
GAUZE 4X4 16PLY ~~LOC~~+RFID DBL (SPONGE) IMPLANT
GLOVE SRG 8 PF TXTR STRL LF DI (GLOVE) ×1 IMPLANT
GLOVE SURG ENC MOIS LTX SZ6.5 (GLOVE) ×2 IMPLANT
GLOVE SURG ENC MOIS LTX SZ8 (GLOVE) ×4 IMPLANT
GLOVE SURG UNDER POLY LF SZ7 (GLOVE) ×2 IMPLANT
GLOVE SURG UNDER POLY LF SZ8 (GLOVE) ×2
GLOVE SURG UNDER POLY LF SZ8.5 (GLOVE) ×2 IMPLANT
GOWN STRL REUS W/TWL LRG LVL3 (GOWN DISPOSABLE) ×4 IMPLANT
GOWN STRL REUS W/TWL XL LVL3 (GOWN DISPOSABLE) ×2 IMPLANT
HANDPIECE INTERPULSE COAX TIP (DISPOSABLE) ×2
HOLDER FOLEY CATH W/STRAP (MISCELLANEOUS) IMPLANT
IMMOBILIZER KNEE 20 (SOFTGOODS) ×2
IMMOBILIZER KNEE 20 THIGH 36 (SOFTGOODS) ×1 IMPLANT
INSERT TIBIAL KNEE SZ 5 12MM (Insert) IMPLANT
KIT TURNOVER KIT A (KITS) IMPLANT
MANIFOLD NEPTUNE II (INSTRUMENTS) ×2 IMPLANT
NS IRRIG 1000ML POUR BTL (IV SOLUTION) ×2 IMPLANT
PACK TOTAL KNEE CUSTOM (KITS) ×2 IMPLANT
PADDING CAST COTTON 6X4 STRL (CAST SUPPLIES) ×3 IMPLANT
PATELLA MEDIAL ATTUN 35MM KNEE (Knees) ×1 IMPLANT
PROTECTOR NERVE ULNAR (MISCELLANEOUS) ×2 IMPLANT
SET HNDPC FAN SPRY TIP SCT (DISPOSABLE) ×1 IMPLANT
SPIKE FLUID TRANSFER (MISCELLANEOUS) ×2 IMPLANT
SPONGE T-LAP 18X18 ~~LOC~~+RFID (SPONGE) ×6 IMPLANT
STRIP CLOSURE SKIN 1/2X4 (GAUZE/BANDAGES/DRESSINGS) ×4 IMPLANT
SUT MNCRL AB 4-0 PS2 18 (SUTURE) ×2 IMPLANT
SUT STRATAFIX 0 PDS 27 VIOLET (SUTURE) ×2
SUT VIC AB 2-0 CT1 27 (SUTURE) ×6
SUT VIC AB 2-0 CT1 TAPERPNT 27 (SUTURE) ×3 IMPLANT
SUTURE STRATFX 0 PDS 27 VIOLET (SUTURE) ×1 IMPLANT
TAPE STRIPS DRAPE STRL (GAUZE/BANDAGES/DRESSINGS) ×1 IMPLANT
TIBIAL INSERT KNEE SZ 5 12MM (Insert) ×2 IMPLANT
TRAY FOLEY MTR SLVR 16FR STAT (SET/KITS/TRAYS/PACK) ×2 IMPLANT
TUBE SUCTION HIGH CAP CLEAR NV (SUCTIONS) ×2 IMPLANT
WATER STERILE IRR 1000ML POUR (IV SOLUTION) ×4 IMPLANT
WRAP KNEE MAXI GEL POST OP (GAUZE/BANDAGES/DRESSINGS) ×2 IMPLANT

## 2021-10-03 NOTE — Progress Notes (Signed)
Assisted Dr. Miller with left, ultrasound guided, adductor canal block. Side rails up, monitors on throughout procedure. See vital signs in flow sheet. Tolerated Procedure well.  

## 2021-10-03 NOTE — Progress Notes (Signed)
Orthopedic Tech Progress Note Patient Details:  Samantha Luna August 17, 1961 [id number]  CPM Left Knee CPM Left Knee: Off Left Knee Flexion (Degrees): 40 Left Knee Extension (Degrees): 10 Additional Comments: CPM OFF Physical therapy request  Post Interventions Patient Tolerated: Well Instructions Provided: Care of device  [name] 10/03/2021, 1:28 PM

## 2021-10-03 NOTE — Progress Notes (Signed)
Orthopedic Tech Progress Note Patient Details:  Samantha Luna 1961-07-04 [id number]  CPM Left Knee CPM Left Knee: On Left Knee Flexion (Degrees): 40 Left Knee Extension (Degrees): 10  Post Interventions Patient Tolerated: Well Instructions Provided: Care of device  [name] 10/03/2021, 11:31 AM

## 2021-10-03 NOTE — Anesthesia Postprocedure Evaluation (Signed)
Anesthesia Post Note  Patient: Samantha Luna  Procedure(s) Performed: TOTAL KNEE ARTHROPLASTY (Left: Knee)     Patient location during evaluation: PACU Anesthesia Type: Spinal Level of consciousness: awake and alert Pain management: pain level controlled Vital Signs Assessment: post-procedure vital signs reviewed and stable Respiratory status: spontaneous breathing, nonlabored ventilation and respiratory function stable Cardiovascular status: blood pressure returned to baseline and stable Postop Assessment: no apparent nausea or vomiting Anesthetic complications: no   No notable events documented.  Last Vitals:  Vitals:   10/03/21 1145 10/03/21 1200  BP: 123/71 127/73  Pulse: 63 67  Resp: (!) 22 16  Temp:    SpO2: 96% 94%    Last Pain:  Vitals:   10/03/21 1200  TempSrc:   PainSc: 0-No pain                 Lynda Rainwater

## 2021-10-03 NOTE — Anesthesia Procedure Notes (Signed)
Anesthesia Regional Block: Adductor canal block   Pre-Anesthetic Checklist: , timeout performed,  Correct Patient, Correct Site, Correct Laterality,  Correct Procedure, Correct Position, site marked,  Risks and benefits discussed,  Surgical consent,  Pre-op evaluation,  At surgeon's request and post-op pain management  Laterality: Left  Prep: chloraprep       Needles:  Injection technique: Single-shot  Needle Type: Stimiplex     Needle Length: 9cm  Needle Gauge: 21     Additional Needles:   Procedures:,,,, ultrasound used (permanent image in chart),,    Narrative:  Start time: 10/03/2021 8:34 AM End time: 10/03/2021 8:39 AM Injection made incrementally with aspirations every 5 mL.  Performed by: Personally  Anesthesiologist: Lynda Rainwater, MD

## 2021-10-03 NOTE — Care Plan (Signed)
Ortho Bundle Case Management Note  Patient Details  Name: Samantha Luna MRN: [id number] Date of Birth: 1961/02/22                   L TKA on 10-03-21 DCP: Home with dtr DME: RW and 3-in-1 ordered through [facility] PT: [facility] on 10-06-21. Patient is already attending and scheduled her p/o appt.   DME Arranged:  3-N-1, [place] rolling DME Agency:  [facility]  HH Arranged:    [place] Agency:     Additional Comments: Please contact me with any questions of if this plan should need to change.  Marianne Sofia, RN,CCM [facility]  [phone] 10/03/2021, 10:52 AM

## 2021-10-03 NOTE — Discharge Instructions (Signed)
Samantha Arabian, MD Total Joint Specialist [facility] [address] [place], [place] [address] [phone]  TOTAL KNEE REPLACEMENT POSTOPERATIVE DIRECTIONS    Knee Rehabilitation, Guidelines Following Surgery  Results after knee surgery are often greatly improved when you follow the exercise, range of motion and muscle strengthening exercises prescribed by your doctor. Safety measures are also important to protect the knee from further injury. If any of these exercises cause you to have increased pain or swelling in your knee joint, decrease the amount until you are comfortable again and slowly increase them. If you have problems or questions, call your caregiver or physical therapist for advice.   BLOOD CLOT PREVENTION Take a 325 mg Aspirin two times a day for three weeks following surgery. Then resume one 81 mg Aspirin once a day. You may resume your vitamins/supplements upon discharge from the hospital. Do not take any NSAIDs (Advil, Aleve, Ibuprofen, Meloxicam, etc.) until you have discontinued the 325 mg Aspirin.  HOME CARE INSTRUCTIONS  Remove items at home which could result in a fall. This includes throw rugs or furniture in walking pathways.  ICE to the affected knee as much as tolerated. Icing helps control swelling. If the swelling is well controlled you will be more comfortable and rehab easier. Continue to use ice on the knee for pain and swelling from surgery. You may notice swelling that will progress down to the foot and ankle. This is normal after surgery. Elevate the leg when you are not up walking on it.    Continue to use the breathing machine which will help keep your temperature down. It is common for your temperature to cycle up and down following surgery, especially at night when you are not up moving around and exerting yourself. The breathing machine keeps your lungs expanded and your temperature down. Do not place pillow under the  operative knee, focus on keeping the knee straight while resting  DIET You may resume your previous home diet once you are discharged from the hospital.  DRESSING / WOUND CARE / SHOWERING Keep your bulky bandage on for 2 days. On the third post-operative day you may remove the Ace bandage and gauze. There is a waterproof adhesive bandage on your skin which will stay in place until your first follow-up appointment. Once you remove this you will not need to place another bandage You may begin showering 3 days following surgery, but do not submerge the incision under water.  ACTIVITY For the first 5 days, the key is rest and control of pain and swelling Do your home exercises twice a day starting on post-operative day 3. On the days you go to physical therapy, just do the home exercises once that day. You should rest, ice and elevate the leg for 50 minutes out of every hour. Get up and walk/stretch for 10 minutes per hour. After 5 days you can increase your activity slowly as tolerated. Walk with your walker as instructed. Use the walker until you are comfortable transitioning to a cane. Walk with the cane in the opposite hand of the operative leg. You may discontinue the cane once you are comfortable and walking steadily. Avoid periods of inactivity such as sitting longer than an hour when not asleep. This helps prevent blood clots.  You may discontinue the knee immobilizer once you are able to perform a straight leg raise while lying down. You may resume a sexual relationship in one month or when given the OK by  your doctor.  You may return to work once you are cleared by your doctor.  Do not drive a car for 6 weeks or until released by your surgeon.  Do not drive while taking narcotics.  TED HOSE STOCKINGS Wear the elastic stockings on both legs for three weeks following surgery during the day. You may remove them at night for sleeping.  WEIGHT BEARING Weight bearing as tolerated with assist  device (walker, cane, etc) as directed, use it as long as suggested by your surgeon or therapist, typically at least 4-6 weeks.  POSTOPERATIVE CONSTIPATION PROTOCOL Constipation - defined medically as fewer than three stools per week and severe constipation as less than one stool per week.  One of the most common issues patients have following surgery is constipation.  Even if you have a regular bowel pattern at home, your normal regimen is likely to be disrupted due to multiple reasons following surgery.  Combination of anesthesia, postoperative narcotics, change in appetite and fluid intake all can affect your bowels.  In order to avoid complications following surgery, here are some recommendations in order to help you during your recovery period.  Colace (docusate) - Pick up an over-the-counter form of Colace or another stool softener and take twice a day as long as you are requiring postoperative pain medications.  Take with a full glass of water daily.  If you experience loose stools or diarrhea, hold the colace until you stool forms back up. If your symptoms do not get better within 1 week or if they get worse, check with your doctor. Dulcolax (bisacodyl) - Pick up over-the-counter and take as directed by the product packaging as needed to assist with the movement of your bowels.  Take with a full glass of water.  Use this product as needed if not relieved by Colace only.  MiraLax (polyethylene glycol) - Pick up over-the-counter to have on hand. MiraLax is a solution that will increase the amount of water in your bowels to assist with bowel movements.  Take as directed and can mix with a glass of water, juice, soda, coffee, or tea. Take if you go more than two days without a movement. Do not use MiraLax more than once per day. Call your doctor if you are still constipated or irregular after using this medication for 7 days in a row.  If you continue to have problems with postoperative constipation,  please contact the office for further assistance and recommendations.  If you experience "the worst abdominal pain ever" or develop nausea or vomiting, please contact the office immediatly for further recommendations for treatment.  ITCHING If you experience itching with your medications, try taking only a single pain pill, or even half a pain pill at a time.  You can also use Benadryl over the counter for itching or also to help with sleep.   MEDICATIONS See your medication summary on the After Visit Summary that the nursing staff will review with you prior to discharge.  You may have some home medications which will be placed on hold until you complete the course of blood thinner medication.  It is important for you to complete the blood thinner medication as prescribed by your surgeon.  Continue your approved medications as instructed at time of discharge.  PRECAUTIONS If you experience chest pain or shortness of breath - call 911 immediately for transfer to the hospital emergency department.  If you develop a fever greater that 101 F, purulent drainage from wound, increased redness  or drainage from wound, foul odor from the wound/dressing, or calf pain - CONTACT YOUR SURGEON.                                                   FOLLOW-UP APPOINTMENTS Make sure you keep all of your appointments after your operation with your surgeon and caregivers. You should call the office at the above phone number and make an appointment for approximately two weeks after the date of your surgery or on the date instructed by your surgeon outlined in the "After Visit Summary".  RANGE OF MOTION AND STRENGTHENING EXERCISES  Rehabilitation of the knee is important following a knee injury or an operation. After just a few days of immobilization, the muscles of the thigh which control the knee become weakened and shrink (atrophy). Knee exercises are designed to build up the tone and strength of the thigh muscles and to  improve knee motion. Often times heat used for twenty to thirty minutes before working out will loosen up your tissues and help with improving the range of motion but do not use heat for the first two weeks following surgery. These exercises can be done on a training (exercise) mat, on the floor, on a table or on a bed. Use what ever works the best and is most comfortable for you Knee exercises include:  Leg Lifts - While your knee is still immobilized in a splint or cast, you can do straight leg raises. Lift the leg to 60 degrees, hold for 3 sec, and slowly lower the leg. Repeat 10-20 times 2-3 times daily. Perform this exercise against resistance later as your knee gets better.  Quad and Hamstring Sets - Tighten up the muscle on the front of the thigh (Quad) and hold for 5-10 sec. Repeat this 10-20 times hourly. Hamstring sets are done by pushing the foot backward against an object and holding for 5-10 sec. Repeat as with quad sets.  Leg Slides: Lying on your back, slowly slide your foot toward your buttocks, bending your knee up off the floor (only go as far as is comfortable). Then slowly slide your foot back down until your leg is flat on the floor again. Angel Wings: Lying on your back spread your legs to the side as far apart as you can without causing discomfort.  A rehabilitation program following serious knee injuries can speed recovery and prevent re-injury in the future due to weakened muscles. Contact your doctor or a physical therapist for more information on knee rehabilitation.   POST-OPERATIVE OPIOID TAPER INSTRUCTIONS: It is important to wean off of your opioid medication as soon as possible. If you do not need pain medication after your surgery it is ok to stop day one. Opioids include: Codeine, Hydrocodone(Norco, Vicodin), Oxycodone(Percocet, oxycontin) and hydromorphone amongst others.  Long term and even short term use of opiods can cause: Increased pain  response Dependence Constipation Depression Respiratory depression And more.  Withdrawal symptoms can include Flu like symptoms Nausea, vomiting And more Techniques to manage these symptoms Hydrate well Eat regular healthy meals Stay active Use relaxation techniques(deep breathing, meditating, yoga) Do Not substitute Alcohol to help with tapering If you have been on opioids for less than two weeks and do not have pain than it is ok to stop all together.  Plan to wean off of opioids This  plan should start within one week post op of your joint replacement. Maintain the same interval or time between taking each dose and first decrease the dose.  Cut the total daily intake of opioids by one tablet each day Next start to increase the time between doses. The last dose that should be eliminated is the evening dose.   IF YOU ARE TRANSFERRED TO A SKILLED REHAB FACILITY If the patient is transferred to a skilled rehab facility following release from the hospital, a list of the current medications will be sent to the facility for the patient to continue.  When discharged from the skilled rehab facility, please have the facility set up the patient's [place] prior to being released. Also, the skilled facility will be responsible for providing the patient with their medications at time of release from the facility to include their pain medication, the muscle relaxants, and their blood thinner medication. If the patient is still at the rehab facility at time of the two week follow up appointment, the skilled rehab facility will also need to assist the patient in arranging follow up appointment in our office and any transportation needs.  MAKE SURE YOU:  Understand these instructions.  Get help right away if you are not doing well or get worse.   DENTAL ANTIBIOTICS:  In most cases prophylactic antibiotics for Dental procdeures after total joint surgery are not  necessary.  Exceptions are as follows:  1. History of prior total joint infection  2. Severely immunocompromised (Organ Transplant, cancer chemotherapy, Rheumatoid biologic meds such as Draper)  3. Poorly controlled diabetes (A1C &gt; 8.0, blood glucose over 200)  If you have one of these conditions, contact your surgeon for an antibiotic prescription, prior to your dental procedure.    Pick up stool softner and laxative for home use following surgery while on pain medications. Do not submerge incision under water. Please use good hand washing techniques while changing dressing each day. May shower starting three days after surgery. Please use a clean towel to pat the incision dry following showers. Continue to use ice for pain and swelling after surgery. Do not use any lotions or creams on the incision until instructed by your surgeon.

## 2021-10-03 NOTE — Anesthesia Procedure Notes (Signed)
Spinal  Patient location during procedure: OR End time: 10/03/2021 9:25 AM Reason for block: surgical anesthesia Staffing Performed: resident/CRNA  Resident/CRNA: Anjelica Gorniak D, CRNA Preanesthetic Checklist Completed: patient identified, IV checked, site marked, risks and benefits discussed, surgical consent, monitors and equipment checked, pre-op evaluation and timeout performed Spinal Block Patient position: sitting Prep: ChloraPrep Patient monitoring: heart rate, continuous pulse ox and blood pressure Approach: right paramedian Location: L3-4 Injection technique: single-shot Needle Needle type: Spinocan  Needle gauge: 24 G Needle length: 9 cm Assessment Sensory level: T6 Events: CSF return

## 2021-10-03 NOTE — Evaluation (Signed)
Physical Therapy Evaluation Patient Details Name: Samantha Luna MRN: [id number] DOB: [birth year] Today's Date: 10/03/2021  History of Present Illness  61 yo s/P LTKA 10/03/2021. PMH:CHF, meningioma, Bell's Palsy, PVD.  Clinical Impression  Patient experiencing   numbness and lack of muscle power of L leg to support safe  ambukation. Noted Lt knee buckling with KI in place.  Patient should progress to return home with family assisting.  Patient will need a youth RW and requesting a BSC.   Pt admitted with above diagnosis.  Pt currently with functional limitations due to the deficits listed below (see PT Problem List). Pt will benefit from skilled PT to increase their independence and safety with mobility to allow discharge to the venue listed below.        Recommendations for follow up therapy are one component of a multi-disciplinary discharge planning process, led by the attending physician.  Recommendations may be updated based on patient status, additional functional criteria and insurance authorization.  Follow Up Recommendations Follow physician's recommendations for discharge plan and follow up therapies    Assistance Recommended at Discharge Intermittent Supervision/Assistance  Patient can return home with the following  A little help with walking and/or transfers;Assistance with cooking/housework;Assist for transportation;Help with stairs or ramp for entrance    Equipment Recommendations Rolling walker (2 wheels);BSC/3in1  Recommendations for Other Services       Functional Status Assessment Patient has had a recent decline in their functional status and demonstrates the ability to make significant improvements in function in a reasonable and predictable amount of time.     Precautions / Restrictions Precautions Precautions: Fall;Knee Precaution Comments: L leg numb this visit and buckling Required Braces or Orthoses: Knee Immobilizer - Right Knee Immobilizer - Right: Discontinue  once straight leg raise with < 10 degree lag Restrictions Weight Bearing Restrictions: No LLE Weight Bearing: Weight bearing as tolerated      Mobility  Bed Mobility Overal bed mobility: Needs Assistance Bed Mobility: Supine to Sit     Supine to sit: Min assist     General bed mobility comments: support the left leg    Transfers Overall transfer level: Needs assistance Equipment used: Rolling walker (2 wheels) Transfers: Sit to/from Stand, Bed to chair/wheelchair/BSC Sit to Stand: Max assist, +2 physical assistance, +2 safety/equipment, Mod assist Stand pivot transfers: Mod assist, +2 physical assistance, +2 safety/equipment         General transfer comment: Patient stood from bed without KI,L knee buckling. KI placed , step pivot to recliner , Lt knee buckling win KI.    Ambulation/Gait                  Stairs            Wheelchair Mobility    Modified Rankin (Stroke Patients Only)       Balance Overall balance assessment: Needs assistance Sitting-balance support: Bilateral upper extremity supported Sitting balance-Leahy Scale: Fair     Standing balance support: During functional activity, Bilateral upper extremity supported, Reliant on assistive device for balance Standing balance-Leahy Scale: Poor                               Pertinent Vitals/Pain Pain Assessment Pain Assessment: 0-10 Pain Score: 5  Pain Location: left knee Pain Descriptors / Indicators: Aching, Discomfort Pain Intervention(s): RN gave pain meds during session, Patient requesting pain meds-RN notified, Monitored during session, Ice applied    Home  Living Family/patient expects to be discharged to:: Private residence Living Arrangements: Alone;Children Available Help at Discharge: Family;Available 24 hours/day Type of Home: Apartment Home Access: Stairs to enter   Entrance Stairs-Number of Steps: 1 curb   Home Layout: One level Home Equipment: None       Prior Function Prior Level of Function : Independent/Modified Independent                     Hand Dominance   Dominant Hand: Right    Extremity/Trunk Assessment   Upper Extremity Assessment Upper Extremity Assessment: Overall WFL for tasks assessed    Lower Extremity Assessment Lower Extremity Assessment: LLE deficits/detail LLE Deficits / Details: reports numbness in the thigh and buttocks, can perforn SLR, knee buckling with WB       Communication   Communication: No difficulties  Cognition Arousal/Alertness: Awake/alert Behavior During Therapy: WFL for tasks assessed/performed Overall Cognitive Status: Within Functional Limits for tasks assessed                                          General Comments      Exercises Total Joint Exercises Ankle Circles/Pumps: AROM, 10 reps, Both Quad Sets: AROM, 10 reps, Both   Assessment/Plan    PT Assessment Patient needs continued PT services  PT Problem List Decreased strength;Decreased knowledge of precautions;Decreased range of motion;Decreased mobility;Decreased knowledge of use of DME;Decreased activity tolerance;Impaired sensation;Decreased safety awareness       PT Treatment Interventions DME instruction;Therapeutic activities;Gait training;Therapeutic exercise;Stair training;Functional mobility training    PT Goals (Current goals can be found in the Care Plan section)  Acute Rehab PT Goals Patient Stated Goal: to go home PT Goal Formulation: With patient Time For Goal Achievement: 10/10/21 Potential to Achieve Goals: Good    Frequency 7X/week     Co-evaluation               AM-PAC PT "6 Clicks" Mobility  Outcome Measure Help needed turning from your back to your side while in a flat bed without using bedrails?: A Little Help needed moving from lying on your back to sitting on the side of a flat bed without using bedrails?: A Little Help needed moving to and from a bed to a  chair (including a wheelchair)?: A Lot Help needed standing up from a chair using your arms (e.g., wheelchair or bedside chair)?: A Lot Help needed to walk in hospital room?: Total Help needed climbing 3-5 steps with a railing? : Total 6 Click Score: 12    End of Session Equipment Utilized During Treatment: Gait belt;Left knee immobilizer Activity Tolerance: Patient tolerated treatment well;Treatment limited secondary to medical complications (Comment) Patient left: in chair;with call bell/phone within reach;with chair alarm set Nurse Communication: Mobility status PT Visit Diagnosis: Unsteadiness on feet (R26.81);Difficulty in walking, not elsewhere classified (R26.2);Other symptoms and signs involving the nervous system (R29.898)    Time: 8889-1694 PT Time Calculation (min) (ACUTE ONLY): 31 min   Charges:     PT Treatments $Therapeutic Activity: 8-22 mins       Tresa Endo PT Acute Rehabilitation Services Pager [phone] Office [phone]    Samantha Luna 10/03/2021, 5:23 PM

## 2021-10-03 NOTE — Op Note (Signed)
OPERATIVE REPORT-TOTAL KNEE ARTHROPLASTY   Pre-operative diagnosis- Osteoarthritis  Left knee(s)  Post-operative diagnosis- Osteoarthritis Left knee(s)  Procedure-  Left  Total Knee Arthroplasty  Surgeon- Dione Plover. Kahdijah Errickson, MD  Assistant- Molli Barrows, PA-C   Anesthesia-   Adductor canal block and spinal  EBL- 25 ml   Drains None  Tourniquet time-  Total Tourniquet Time Documented: Thigh (Left) - 34 minutes Total: Thigh (Left) - 34 minutes     Complications- None  Condition-PACU - hemodynamically stable.   Brief Clinical Note  Samantha Luna is a 61 y.o. year old female with end stage OA of her left knee with progressively worsening pain and dysfunction. She has constant pain, with activity and at rest and significant functional deficits with difficulties even with ADLs. She has had extensive non-op management including analgesics, injections of cortisone and viscosupplements, and home exercise program, but remains in significant pain with significant dysfunction. Radiographs show bone on bone arthritis medial and patellofemoral. She presents now for left Total Knee Arthroplasty.     Procedure in detail---   The patient is brought into the operating room and positioned supine on the operating table. After successful administration of  Adductor canal block and spinal,   a tourniquet is placed high on the  Left thigh(s) and the lower extremity is prepped and draped in the usual sterile fashion. Time out is performed by the operating team and then the  Left lower extremity is wrapped in Esmarch, knee flexed and the tourniquet inflated to 300 mmHg.       A midline incision is made with a ten blade through the subcutaneous tissue to the level of the extensor mechanism. A fresh blade is used to make a medial parapatellar arthrotomy. Soft tissue over the proximal medial tibia is subperiosteally elevated to the joint line with a knife and into the semimembranosus bursa with a Cobb elevator.  Soft tissue over the proximal lateral tibia is elevated with attention being paid to avoiding the patellar tendon on the tibial tubercle. The patella is everted, knee flexed 90 degrees and the ACL and PCL are removed. Findings are bone on bone medial and patellofemoral with large global osteophytes.        The drill is used to create a starting hole in the distal femur and the canal is thoroughly irrigated with sterile saline to remove the fatty contents. The 5 degree Left  valgus alignment guide is placed into the femoral canal and the distal femoral cutting block is pinned to remove 9 mm off the distal femur. Resection is made with an oscillating saw.      The tibia is subluxed forward and the menisci are removed. The extramedullary alignment guide is placed referencing proximally at the medial aspect of the tibial tubercle and distally along the second metatarsal axis and tibial crest. The block is pinned to remove 11mm off the more deficient medial  side. Resection is made with an oscillating saw. Size 4is the most appropriate size for the tibia and the proximal tibia is prepared with the modular drill and keel punch for that size.      The femoral sizing guide is placed and size 5 is most appropriate. Rotation is marked off the epicondylar axis and confirmed by creating a rectangular flexion gap at 90 degrees. The size 5 cutting block is pinned in this rotation and the anterior, posterior and chamfer cuts are made with the oscillating saw. The intercondylar block is then placed and that cut is  made.      Trial size 4 tibial component, trial size 5 posterior stabilized femur and a 12  mm posterior stabilized rotating platform insert trial is placed. Full extension is achieved with excellent varus/valgus and anterior/posterior balance throughout full range of motion. The patella is everted and thickness measured to be 22  mm. Free hand resection is taken to 12 mm, a 35 template is placed, lug holes are  drilled, trial patella is placed, and it tracks normally. Osteophytes are removed off the posterior femur with the trial in place. All trials are removed and the cut bone surfaces prepared with pulsatile lavage. Cement is mixed and once ready for implantation, the size 4 tibial implant, size  5 posterior stabilized femoral component, and the size 35 patella are cemented in place and the patella is held with the clamp. The trial insert is placed and the knee held in full extension. The Exparel (20 ml mixed with 60 ml saline) is injected into the extensor mechanism, posterior capsule, medial and lateral gutters and subcutaneous tissues.  All extruded cement is removed and once the cement is hard the permanent 12 mm posterior stabilized rotating platform insert is placed into the tibial tray.      The wound is copiously irrigated with saline solution and the extensor mechanism closed with # 0 Stratofix suture. The tourniquet is released for a total tourniquet time of 34  minutes. Flexion against gravity is 140 degrees and the patella tracks normally. Subcutaneous tissue is closed with 2.0 vicryl and subcuticular with running 4.0 Monocryl. The incision is cleaned and dried and steri-strips and a bulky sterile dressing are applied. The limb is placed into a knee immobilizer and the patient is awakened and transported to recovery in stable condition.      Please note that a surgical assistant was a medical necessity for this procedure in order to perform it in a safe and expeditious manner. Surgical assistant was necessary to retract the ligaments and vital neurovascular structures to prevent injury to them and also necessary for proper positioning of the limb to allow for anatomic placement of the prosthesis.   Dione Plover Kale Rondeau, MD    10/03/2021, 10:22 AM

## 2021-10-03 NOTE — Transfer of Care (Signed)
Immediate Anesthesia Transfer of Care Note  Patient: Samantha Luna  Procedure(s) Performed: TOTAL KNEE ARTHROPLASTY (Left: Knee)  Patient Location: PACU  Anesthesia Type:Regional and Spinal  Level of Consciousness: awake, alert  and oriented  Airway & Oxygen Therapy: Patient Spontanous Breathing and Patient connected to face mask oxygen  Post-op Assessment: Report given to RN and Post -op Vital signs reviewed and stable  Post vital signs: Reviewed and stable  Last Vitals:  Vitals Value Taken Time  BP 104/53 10/03/21 1050  Temp    Pulse 77 10/03/21 1052  Resp 20 10/03/21 1052  SpO2 100 % 10/03/21 1052  Vitals shown include unvalidated device data.  Last Pain:  Vitals:   10/03/21 0833  TempSrc:   PainSc: 4       Patients Stated Pain Goal: 4 (70/92/95 [id number])  Complications: No notable events documented.

## 2021-10-03 NOTE — Interval H&P Note (Signed)
History and Physical Interval Note:  10/03/2021 7:15 AM  Samantha Luna  has presented today for surgery, with the diagnosis of left knee osteoarthritis.  The various methods of treatment have been discussed with the patient and family. After consideration of risks, benefits and other options for treatment, the patient has consented to  Procedure(s): TOTAL KNEE ARTHROPLASTY (Left) as a surgical intervention.  The patient's history has been reviewed, patient examined, no change in status, stable for surgery.  I have reviewed the patient's chart and labs.  Questions were answered to the patient's satisfaction.     Pilar Plate Issaih Kaus

## 2021-10-04 ENCOUNTER — Encounter (HOSPITAL_COMMUNITY): Payer: Self-pay | Admitting: Orthopedic Surgery

## 2021-10-04 DIAGNOSIS — M1712 Unilateral primary osteoarthritis, left knee: Secondary | ICD-10-CM | POA: Diagnosis not present

## 2021-10-04 LAB — BASIC METABOLIC PANEL
Anion gap: 8 (ref 5–15)
BUN: 13 mg/dL (ref 8–23)
CO2: 23 mmol/L (ref 22–32)
Calcium: 8.8 mg/dL — ABNORMAL LOW (ref 8.9–10.3)
Chloride: 105 mmol/L (ref 98–111)
Creatinine, Ser: 0.8 mg/dL (ref 0.44–1.00)
GFR, Estimated: >60 mL/min (ref >60)
Glucose, Bld: 234 mg/dL — ABNORMAL HIGH (ref 70–99)
Potassium: 3.4 mmol/L — ABNORMAL LOW (ref 3.5–5.1)
Sodium: 136 mmol/L (ref 135–145)

## 2021-10-04 LAB — CBC
HCT: 32.2 % — ABNORMAL LOW (ref 36.0–46.0)
Hemoglobin: 10.3 g/dL — ABNORMAL LOW (ref 12.0–15.0)
MCH: 29.3 pg (ref 26.0–34.0)
MCHC: 32 g/dL (ref 30.0–36.0)
MCV: 91.7 fL (ref 80.0–100.0)
Platelets: 260 10*3/uL (ref 150–400)
RBC: 3.51 MIL/uL — ABNORMAL LOW (ref 3.87–5.11)
RDW: 12.8 % (ref 11.5–15.5)
WBC: 13.1 10*3/uL — ABNORMAL HIGH (ref 4.0–10.5)
nRBC: 0 % (ref 0.0–0.2)

## 2021-10-04 MED ORDER — TRAMADOL HCL 50 MG PO TABS: 50.0000 mg | ORAL_TABLET | Freq: Four times a day (QID) | ORAL | 0 refills | Status: DC | PRN

## 2021-10-04 MED ORDER — GABAPENTIN 300 MG PO CAPS: ORAL_CAPSULE | ORAL | 0 refills | Status: DC

## 2021-10-04 MED ORDER — ASPIRIN 325 MG PO TBEC: 325.0000 mg | DELAYED_RELEASE_TABLET | Freq: Two times a day (BID) | ORAL | 0 refills | Status: AC

## 2021-10-04 MED ORDER — ASPIRIN 325 MG PO TBEC: 325.0000 mg | DELAYED_RELEASE_TABLET | Freq: Two times a day (BID) | ORAL | 0 refills | Status: DC

## 2021-10-04 MED ORDER — HYDROMORPHONE HCL 2 MG PO TABS: 2.0000 mg | ORAL_TABLET | Freq: Four times a day (QID) | ORAL | 0 refills | Status: DC | PRN

## 2021-10-04 MED ORDER — HYDROMORPHONE HCL 2 MG PO TABS
2.0000 mg | ORAL_TABLET | Freq: Four times a day (QID) | ORAL | 0 refills | Status: DC | PRN
Start: 2021-10-04 — End: 2021-11-08

## 2021-10-04 MED ORDER — METHOCARBAMOL 500 MG PO TABS: 500.0000 mg | ORAL_TABLET | Freq: Four times a day (QID) | ORAL | 0 refills | Status: DC | PRN

## 2021-10-04 MED ORDER — POTASSIUM CHLORIDE CRYS ER 20 MEQ PO TBCR
40.0000 meq | EXTENDED_RELEASE_TABLET | Freq: Once | ORAL | Status: AC
  Administered 2021-10-04: 40 meq via ORAL
  Filled 2021-10-04: qty 2

## 2021-10-04 NOTE — Progress Notes (Addendum)
Physical Therapy Treatment Patient Details Name: Samantha Luna MRN: [id number] DOB: 03-23-1961 Today's Date: 10/04/2021   History of Present Illness 61 yo s/P LTKA 10/03/2021. PMH:CHF, meningioma, Bell's Palsy, PVD.    PT Comments    The patient is now progressing . 2 daughters are present and engaged.  Patient had urinary urgency/incontinence. Patient  left in BR with daughters assisting patient to wash up. Will practice 1 step next visit.   Recommendations for follow up therapy are one component of a multi-disciplinary discharge planning process, led by the attending physician.  Recommendations may be updated based on patient status, additional functional criteria and insurance authorization.  Follow Up Recommendations  Follow physician's recommendations for discharge plan and follow up therapies     Assistance Recommended at Discharge Intermittent Supervision/Assistance  Patient can return home with the following A little help with walking and/or transfers;Assistance with cooking/housework;Assist for transportation;Help with stairs or ramp for entrance   Equipment Recommendations  Rolling walker (2 wheels);BSC/3in1    Recommendations for Other Services       Precautions / Restrictions Precautions Precautions: Fall;Knee Required Braces or Orthoses: Knee Immobilizer -  Knee Immobilizer -  Discontinue once straight leg raise with < 10 degree lag Restrictions LLE Weight Bearing: Weight bearing as tolerated     Mobility  Bed Mobility Overal bed mobility: Needs Assistance Bed Mobility: Supine to Sit     Supine to sit: Min assist     General bed mobility comments: support the left leg    Transfers Overall transfer level: Needs assistance Equipment used: Rolling walker (2 wheels) Transfers: Sit to/from Stand Sit to Stand: Min assist           General transfer comment: cues for hand and left leg position to stand and sit.    Ambulation/Gait Ambulation/Gait  assistance: Min assist Gait Distance (Feet): 20 Feet Assistive device: Rolling walker (2 wheels) Gait Pattern/deviations: Step-to pattern       General Gait Details: KI  not in place. Once standing patient had urgency to urinate and was incontinent to  get to BR. Patient's daughters present on availbalre to assist with washing patient up.   Stairs             Wheelchair Mobility    Modified Rankin (Stroke Patients Only)       Balance   Sitting-balance support: Bilateral upper extremity supported, Feet supported Sitting balance-Leahy Scale: Fair     Standing balance support: During functional activity, Bilateral upper extremity supported, Reliant on assistive device for balance Standing balance-Leahy Scale: Fair                              Cognition Arousal/Alertness: Awake/alert Behavior During Therapy: WFL for tasks assessed/performed Overall Cognitive Status: Within Functional Limits for tasks assessed                                          Exercises Total Joint Exercises Ankle Circles/Pumps: AROM, 10 reps, Both Quad Sets: AROM, 10 reps, Both Heel Slides: AAROM, Left, 10 reps Hip ABduction/ADduction: AROM, Left, 10 reps Straight Leg Raises: AAROM, Left, 10 reps Long Arc Quad: AROM, Left, 10 reps Knee Flexion: AROM, Left, 10 reps    General Comments        Pertinent Vitals/Pain Pain Assessment Pain Score: 4  Pain Location: left knee  Pain Descriptors / Indicators: Aching, Discomfort Pain Intervention(s): Monitored during session, Premedicated before session    Home Living                          Prior Function            PT Goals (current goals can now be found in the care plan section) Progress towards PT goals: Progressing toward goals    Frequency    7X/week      PT Plan Current plan remains appropriate    Co-evaluation              AM-PAC PT "6 Clicks" Mobility   Outcome  Measure  Help needed turning from your back to your side while in a flat bed without using bedrails?: A Little Help needed moving from lying on your back to sitting on the side of a flat bed without using bedrails?: A Little Help needed moving to and from a bed to a chair (including a wheelchair)?: A Little Help needed standing up from a chair using your arms (e.g., wheelchair or bedside chair)?: A Little Help needed to walk in hospital room?: A Little Help needed climbing 3-5 steps with a railing? : A Little 6 Click Score: 18    End of Session Equipment Utilized During Treatment: Gait belt Activity Tolerance: Patient tolerated treatment well;Treatment limited secondary to medical complications (Comment) Patient left:  (on toilet  with 2 daughters) Nurse Communication: Mobility status PT Visit Diagnosis: Unsteadiness on feet (R26.81);Difficulty in walking, not elsewhere classified (R26.2);Other symptoms and signs involving the nervous system (R29.898)     Time: 4076-8088 PT Time Calculation (min) (ACUTE ONLY): 29 min  Charges:  $Gait Training: 8-22 mins $Therapeutic Exercise: 8-22 mins                     Tresa Endo PT Acute Rehabilitation Services Pager [phone] Office [phone]    Claretha Cooper 10/04/2021, 1:57 PM

## 2021-10-04 NOTE — TOC Transition Note (Signed)
Transition of Care [facility]) - CM/SW Discharge Note   Patient Details  Name: Samantha Luna MRN: [id number] Date of Birth: 1961/01/18  [facility] CM/SW Contact:  Lennart Pall, LCSW Phone Number: 10/04/2021, 12:37 PM   Clinical Narrative:    Met with pt and daughters and confirming she has received rw and 3n1 via [facility].  Plan for OPPT @ [facility].  No TOC needs.   Final next level of care: OP Rehab Barriers to Discharge: No Barriers Identified   Patient Goals and CMS Choice Patient states their goals for this hospitalization and ongoing recovery are:: return home      Discharge Placement                       Discharge Plan and Services                DME Arranged: 3-N-1, Walker rolling DME Agency: [facility]                  Social Determinants of Health (SDOH) Interventions     Readmission Risk Interventions No flowsheet data found.

## 2021-10-04 NOTE — Progress Notes (Signed)
Subjective: 1 Day Post-Op Procedure(s) (LRB): TOTAL KNEE ARTHROPLASTY (Left) Patient reports pain as mild.   Patient seen in rounds by Dr. Wynelle Link. Patient is well, and has had no acute complaints or problems No issues overnight. Denies chest pain, SOB, or calf pain. Foley catheter removed this AM.  We will continue therapy today.  Objective: Vital signs in last 24 hours: Temp:  [97.7 F (36.5 C)-99.5 F (37.5 C)] 98.5 F (36.9 C) (02/07 0535) Pulse Rate:  [63-105] 93 (02/07 0535) Resp:  [11-22] 12 (02/07 0535) BP: (115-167)/(54-90) 139/71 (02/07 0535) SpO2:  [93 %-100 %] 97 % (02/07 0535) Weight:  [87.1 kg] 87.1 kg (02/06 0734)  Intake/Output from previous day:  Intake/Output Summary (Last 24 hours) at 10/04/2021 0729 Last data filed at 10/04/2021 0600 Gross per 24 hour  Intake 4215 ml  Output 2450 ml  Net 1765 ml     Intake/Output this shift: No intake/output data recorded.  Labs: Recent Labs    10/04/21 0327  HGB 10.3*   Recent Labs    10/04/21 0327  WBC 13.1*  RBC 3.51*  HCT 32.2*  PLT 260   Recent Labs    10/04/21 0327  NA 136  K 3.4*  CL 105  CO2 23  BUN 13  CREATININE 0.80  GLUCOSE 234*  CALCIUM 8.8*   No results for input(s): LABPT, INR in the last 72 hours.  Exam: General - Patient is Alert and Oriented Extremity - Neurologically intact Neurovascular intact Sensation intact distally Dorsiflexion/Plantar flexion intact Dressing - dressing C/D/I Motor Function - intact, moving foot and toes well on exam.   Past Medical History:  Diagnosis Date   Acute exacerbation of chronic obstructive airways disease ([place]) 11/09/2017   Hospitalization 11/09/17 in [place]   Arthritis    Asthma    Bell's palsy 11/10/2020   hx of bewlls palsy   Complication of anesthesia    hysterectomy - slow to wake up   Congestive heart failure ([place]) 04/26/2010   Echo 11/13/20 in [place] showed normal ejection fraction, normal echo.     GERD (gastroesophageal reflux disease)    Headache    Hyperlipidemia 08/18/2021   Hypertension    Meningioma ([place]) 04/23/2010   01/28/13 Head CT for headaches: A left posterior parafalcine meningioma is seen that measures 1.5 cm,  previously measuring 1.3 cm. Basal cisterns and sulci are unremarkable. Impression:  Left posterior parafalcine meningioma is mildly increased in size since  prior without significant surrounding mass effect or acute process visualized.   Acute or subacute ischemia is not excluded with CT.    Peripheral vascular disease ([place])    Pneumonia    Pre-diabetes    Restrictive lung disease 03/05/2018   Sleep apnea     Assessment/Plan: 1 Day Post-Op Procedure(s) (LRB): TOTAL KNEE ARTHROPLASTY (Left) Principal Problem:   OA (osteoarthritis) of knee Active Problems:   Primary osteoarthritis of left knee  Estimated body mass index is 36.28 kg/m as calculated from the following:   Height as of this encounter: 5\' 1"  (1.549 m).   Weight as of this encounter: 87.1 kg. Advance diet Up with therapy D/C IV fluids   Patient's anticipated LOS is less than 2 midnights, meeting these requirements: - Younger than 97 - Lives within 1 hour of care - Has a competent adult at home to recover with post-op recover - NO history of  - Chronic pain requiring opiods  - Diabetes  - Coronary Artery Disease  -  Heart failure  - Heart attack  - Stroke  - DVT/VTE  - Cardiac arrhythmia  - Respiratory Failure/COPD  - Renal failure  - Anemia  - Advanced Liver disease     DVT Prophylaxis - Aspirin Weight bearing as tolerated. Continue therapy.  Potassium 3.4 this AM, one dose of 40 mEq Kcl ordered.  Plan is to go Home after hospital stay. Plan for discharge later today if progresses with therapy and meeting goals. Scheduled for OPPT at [facility]. Follow-up in the office in 2 weeks.  The PDMP database was reviewed today prior to any opioid medications being prescribed to this  patient.  Theresa Duty, PA-C Orthopedic Surgery [phone] 10/04/2021, 7:29 AM

## 2021-10-04 NOTE — Progress Notes (Addendum)
Physical Therapy Treatment °Patient Details °Name: Samantha Luna °MRN: [id number] °DOB: 12/14/1960 °Today's Date: 10/04/2021 ° ° °History of Present Illness 61 yo s/P LTKA 10/03/2021. PMH:CHF, meningioma, Bell's Palsy, PVD. ° °  °PT Comments  ° ° The patient  demonstrates improved stability usin left KI. Family  and  patient aware to use KI if patient feels  left knee is not stable when ambulating. Patient has met PT goals for DC.   °Recommendations for follow up therapy are one component of a multi-disciplinary discharge planning process, led by the attending physician.  Recommendations may be updated based on patient status, additional functional criteria and insurance authorization. ° °Follow Up Recommendations ° Follow physician's recommendations for discharge plan and follow up therapies °  °  °Assistance Recommended at Discharge Intermittent Supervision/Assistance  °Patient can return home with the following A little help with walking and/or transfers;Assistance with cooking/housework;Assist for transportation;Help with stairs or ramp for entrance °  °Equipment Recommendations ° Rolling walker (2 wheels);BSC/3in1  °  °Recommendations for Other Services   ° ° °  °Precautions / Restrictions Precautions °Precautions: Fall;Knee °Required Braces or Orthoses: Knee Immobilizer - Left °Knee Immobilizer - Right: Discontinue once straight leg raise with < 10 degree lag °Restrictions °LLE Weight Bearing: Weight bearing as tolerated  °  ° °Mobility ° Bed Mobility °Overal bed mobility: Needs Assistance °Bed Mobility: sit to supine, supervision °  °  °  ° °Transfers °Overall transfer level: Needs assistance °Equipment used: Rolling walker (2 wheels) °Transfers: Sit to/from Stand °Sit to Stand: Min guard °  °  °  °  °  °General transfer comment: cues for hand and left leg position to stand and sit. °  ° °Ambulation/Gait °Ambulation/Gait assistance: Min guard °Gait Distance (Feet): 50 Feet °Assistive device: Rolling walker (2  wheels) °Gait Pattern/deviations: Step-to pattern, Decreased stance time - right, Antalgic °  °  °  °General Gait Details: improved  stability with KI on left ° ° °Stairs ° ° °Wheelchair Mobility °  ° °Modified Rankin (Stroke Patients Only) °  ° ° °  °Balance   °Sitting-balance support: Bilateral upper extremity supported, Feet supported °Sitting balance-Leahy Scale: Good °  °  °Standing balance support: During functional activity, Bilateral upper extremity supported, Reliant on assistive device for balance °Standing balance-Leahy Scale: Fair °  °  °  °  °  °  °  °  °  °  °  °  °  ° °  °Cognition Arousal/Alertness: Awake/alert °Behavior During Therapy: WFL for tasks assessed/performed °Overall Cognitive Status: Within Functional Limits for tasks assessed °  °  °  °  °  °  °  °  °  °  °  °  °  °  °  °  °  °  °  ° °  °Exercises  °  °General Comments   °  °  ° °Pertinent Vitals/Pain Pain Assessment °Pain Score: 5  °Pain Location: left knee °Pain Descriptors / Indicators: Aching, Discomfort °Pain Intervention(s): Monitored during session, Premedicated before session  ° ° °Home Living   °  °  °  °  °  °  °  °  °  °   °  °Prior Function    °  °  °   ° °PT Goals (current goals can now be found in the care plan section) Progress towards PT goals: Progressing toward goals ° °  °Frequency ° ° ° 7X/week ° ° ° °  °  PT Plan Current plan remains appropriate  ° ° °Co-evaluation   °  °  °  °  ° °  °AM-PAC PT "6 Clicks" Mobility   °Outcome Measure ° Help needed turning from your back to your side while in a flat bed without using bedrails?: A Little °Help needed moving from lying on your back to sitting on the side of a flat bed without using bedrails?: A Little °Help needed moving to and from a bed to a chair (including a wheelchair)?: A Little °Help needed standing up from a chair using your arms (e.g., wheelchair or bedside chair)?: A Little °Help needed to walk in hospital room?: A Little °Help needed climbing 3-5 steps with a  railing? : A Little °6 Click Score: 18 ° °  °End of Session Equipment Utilized During Treatment: Left knee immobilizer °Activity Tolerance: Patient tolerated treatment well °Patient left: in chair;with family/visitor present °Nurse Communication: Mobility status °PT Visit Diagnosis: Unsteadiness on feet (R26.81);Difficulty in walking, not elsewhere classified (R26.2);Other symptoms and signs involving the nervous system (R29.898) °  ° ° °Time: 1113-1126 °PT Time Calculation (min) (ACUTE ONLY): 13 min ° °Charges:  $Gait Training: 8-22 mins ° °  PT °Acute Rehabilitation Services °Pager [phone] °Office [phone] ° ° °,  Elizabeth °10/04/2021, 2:10 PM ° °

## 2021-10-04 NOTE — Progress Notes (Addendum)
Physical Therapy Treatment Patient Details Name: Samantha Luna MRN: [id number] DOB: 1961/02/05 Today's Date: 10/04/2021   History of Present Illness 61 yo s/P LTKA 10/03/2021. PMH:CHF, meningioma, Bell's Palsy, PVD.    PT Comments    The patient ambulated and practiced 1 step with out lt KI. Recommended that patient use KI for traveling home, up a curb  and if she feels that the knee  is buckling.   Recommendations for follow up therapy are one component of a multi-disciplinary discharge planning process, led by the attending physician.  Recommendations may be updated based on patient status, additional functional criteria and insurance authorization.  Follow Up Recommendations  Follow physician's recommendations for discharge plan and follow up therapies     Assistance Recommended at Discharge Intermittent Supervision/Assistance  Patient can return home with the following A little help with walking and/or transfers;Assistance with cooking/housework;Assist for transportation;Help with stairs or ramp for entrance   Equipment Recommendations  Rolling walker (2 wheels);BSC/3in1    Recommendations for Other Services       Precautions / Restrictions Precautions Precautions: Fall;Knee Required Braces or Orthoses: Knee Immobilizer - left Knee Immobilizer - left: Discontinue once straight leg raise with < 10 degree lag Restrictions LLE Weight Bearing: Weight bearing as tolerated     Mobility  Bed Mobility Overal bed mobility: Needs Assistance Bed Mobility:      Supine to sit: Min  guard     General bed mobility comments: bed raised to patient's height that she has at home,. placed a step stool for patient to step onto to get buttocks onto bed. daughters present to assist.    Transfers Overall transfer level: Needs assistance Equipment used: Standard walker Transfers: Sit to/from Stand Sit to Stand: Min guard           General transfer comment: cues for hand and left leg  position to stand and sit.    Ambulation/Gait Ambulation/Gait assistance: Min guard, Min assist Gait Distance (Feet): 40 Feet Assistive device: Rolling walker (2 wheels) Gait Pattern/deviations: Step-to pattern, Decreased stance time - right, Antalgic       General Gait Details: Did not have KI in  place. patient noted to be  strongly antalgic over the  left leg   Stairs Stairs: Yes Stairs assistance: Mod assist Stair Management: No rails, Step to pattern Number of Stairs: 1 General stair comments: 2 daughters present to assist on 1 step. Noted left knee tends to buckle so  recommended that patient  weair KI home. patient also stated  the car can be parked at curb and patient does not have to step up.   Wheelchair Mobility    Modified Rankin (Stroke Patients Only)       Balance   Sitting-balance support: Bilateral upper extremity supported, Feet supported Sitting balance-Leahy Scale: Good     Standing balance support: During functional activity, Bilateral upper extremity supported, Reliant on assistive device for balance Standing balance-Leahy Scale: Fair                              Cognition Arousal/Alertness: Awake/alert Behavior During Therapy: WFL for tasks assessed/performed Overall Cognitive Status: Within Functional Limits for tasks assessed                                          Exercises   General Comments  Pertinent Vitals/Pain Pain Assessment Pain Score: 5  Pain Location: left knee Pain Descriptors / Indicators: Aching, Discomfort Pain Intervention(s): Monitored during session, Premedicated before session    Home Living                          Prior Function            PT Goals (current goals can now be found in the care plan section) Progress towards PT goals: Progressing toward goals    Frequency    7X/week      PT Plan Current plan remains appropriate    Co-evaluation               AM-PAC PT "6 Clicks" Mobility   Outcome Measure  Help needed turning from your back to your side while in a flat bed without using bedrails?: A Little Help needed moving from lying on your back to sitting on the side of a flat bed without using bedrails?: A Little Help needed moving to and from a bed to a chair (including a wheelchair)?: A Little Help needed standing up from a chair using your arms (e.g., wheelchair or bedside chair)?: A Little Help needed to walk in hospital room?: A Little Help needed climbing 3-5 steps with a railing? : A Little 6 Click Score: 18    End of Session Equipment Utilized During Treatment: Gait belt Activity Tolerance: Patient tolerated treatment well;Treatment limited secondary to medical complications (Comment) Patient left:  (on toilet  with 2 daughters) Nurse Communication: Mobility status PT Visit Diagnosis: Unsteadiness on feet (R26.81);Difficulty in walking, not elsewhere classified (R26.2);Other symptoms and signs involving the nervous system (R29.898)     Time: 3235-5732 PT Time Calculation (min) (ACUTE ONLY): 24 min  Charges:  $Gait Training: 8-22 mins $Self Care/Home Management: [employer] Pager [phone] Office [phone]    Claretha Cooper 10/04/2021, 2:05 PM

## 2021-10-05 ENCOUNTER — Emergency Department (HOSPITAL_BASED_OUTPATIENT_CLINIC_OR_DEPARTMENT_OTHER)
Admission: EM | Admit: 2021-10-05 | Discharge: 2021-10-06 | Disposition: A | Discharge status: Home / Self Care | Payer: Medicare HMO | Attending: Emergency Medicine | Admitting: Emergency Medicine

## 2021-10-05 ENCOUNTER — Other Ambulatory Visit: Payer: Self-pay

## 2021-10-05 ENCOUNTER — Emergency Department (HOSPITAL_BASED_OUTPATIENT_CLINIC_OR_DEPARTMENT_OTHER): Payer: Medicare HMO

## 2021-10-05 ENCOUNTER — Encounter (HOSPITAL_BASED_OUTPATIENT_CLINIC_OR_DEPARTMENT_OTHER): Payer: Self-pay

## 2021-10-05 DIAGNOSIS — R062 Wheezing: Secondary | ICD-10-CM | POA: Diagnosis not present

## 2021-10-05 DIAGNOSIS — R0602 Shortness of breath: Secondary | ICD-10-CM | POA: Insufficient documentation

## 2021-10-05 DIAGNOSIS — Z96652 Presence of left artificial knee joint: Secondary | ICD-10-CM | POA: Insufficient documentation

## 2021-10-05 DIAGNOSIS — Z7982 Long term (current) use of aspirin: Secondary | ICD-10-CM | POA: Insufficient documentation

## 2021-10-05 DIAGNOSIS — M7989 Other specified soft tissue disorders: Secondary | ICD-10-CM | POA: Diagnosis not present

## 2021-10-05 LAB — TROPONIN I (HIGH SENSITIVITY)
Troponin I (High Sensitivity): 6 ng/L (ref <18)
Troponin I (High Sensitivity): 6 ng/L (ref <18)

## 2021-10-05 LAB — CBC WITH DIFFERENTIAL/PLATELET
Abs Immature Granulocytes: 0.17 10*3/uL — ABNORMAL HIGH (ref 0.00–0.07)
Basophils Absolute: 0 10*3/uL (ref 0.0–0.1)
Basophils Relative: 0 %
Eosinophils Absolute: 0.1 10*3/uL (ref 0.0–0.5)
Eosinophils Relative: 1 %
HCT: 27.9 % — ABNORMAL LOW (ref 36.0–46.0)
Hemoglobin: 9.2 g/dL — ABNORMAL LOW (ref 12.0–15.0)
Immature Granulocytes: 1 %
Lymphocytes Relative: 26 %
Lymphs Abs: 3.1 10*3/uL (ref 0.7–4.0)
MCH: 29.5 pg (ref 26.0–34.0)
MCHC: 33 g/dL (ref 30.0–36.0)
MCV: 89.4 fL (ref 80.0–100.0)
Monocytes Absolute: 0.9 10*3/uL (ref 0.1–1.0)
Monocytes Relative: 7 %
Neutro Abs: 7.6 10*3/uL (ref 1.7–7.7)
Neutrophils Relative %: 65 %
Platelets: 248 10*3/uL (ref 150–400)
RBC: 3.12 MIL/uL — ABNORMAL LOW (ref 3.87–5.11)
RDW: 13.2 % (ref 11.5–15.5)
WBC: 11.9 10*3/uL — ABNORMAL HIGH (ref 4.0–10.5)
nRBC: 0 % (ref 0.0–0.2)

## 2021-10-05 LAB — BASIC METABOLIC PANEL
Anion gap: 8 (ref 5–15)
BUN: 19 mg/dL (ref 8–23)
CO2: 25 mmol/L (ref 22–32)
Calcium: 8.5 mg/dL — ABNORMAL LOW (ref 8.9–10.3)
Chloride: 101 mmol/L (ref 98–111)
Creatinine, Ser: 0.87 mg/dL (ref 0.44–1.00)
GFR, Estimated: >60 mL/min (ref >60)
Glucose, Bld: 172 mg/dL — ABNORMAL HIGH (ref 70–99)
Potassium: 3.4 mmol/L — ABNORMAL LOW (ref 3.5–5.1)
Sodium: 134 mmol/L — ABNORMAL LOW (ref 135–145)

## 2021-10-05 LAB — D-DIMER, QUANTITATIVE: D-Dimer, Quant: 1.55 ug/mL-FEU — ABNORMAL HIGH (ref 0.00–0.50)

## 2021-10-05 LAB — BRAIN NATRIURETIC PEPTIDE: B Natriuretic Peptide: 94.6 pg/mL (ref 0.0–100.0)

## 2021-10-05 MED ORDER — HYDROMORPHONE HCL 1 MG/ML IJ SOLN
1.0000 mg | Freq: Once | INTRAMUSCULAR | Status: AC
  Administered 2021-10-05: 1 mg via INTRAVENOUS
  Filled 2021-10-05: qty 1

## 2021-10-05 MED ORDER — IPRATROPIUM-ALBUTEROL 0.5-2.5 (3) MG/3ML IN SOLN
3.0000 mL | Freq: Once | RESPIRATORY_TRACT | Status: AC
  Administered 2021-10-05: 3 mL via RESPIRATORY_TRACT
  Filled 2021-10-05: qty 3

## 2021-10-05 MED ORDER — METHOCARBAMOL 500 MG PO TABS
500.0000 mg | ORAL_TABLET | Freq: Once | ORAL | Status: AC
Start: 2021-10-05 — End: 2021-10-05
  Administered 2021-10-05: 500 mg via ORAL
  Filled 2021-10-05: qty 1

## 2021-10-05 MED ORDER — GABAPENTIN 300 MG PO CAPS
300.0000 mg | ORAL_CAPSULE | ORAL | Status: AC
  Administered 2021-10-05: 300 mg via ORAL
  Filled 2021-10-05: qty 1

## 2021-10-05 MED ORDER — HYDROCORTISONE SOD SUC (PF) 100 MG IJ SOLR
200.0000 mg | Freq: Once | INTRAMUSCULAR | Status: AC
  Administered 2021-10-05: 200 mg via INTRAVENOUS
  Filled 2021-10-05: qty 4

## 2021-10-05 MED ORDER — DIPHENHYDRAMINE HCL 25 MG PO CAPS: 50.0000 mg | ORAL_CAPSULE | Freq: Once | ORAL | Status: AC

## 2021-10-05 MED ORDER — DIPHENHYDRAMINE HCL 50 MG/ML IJ SOLN
50.0000 mg | Freq: Once | INTRAMUSCULAR | Status: AC
  Administered 2021-10-06: 50 mg via INTRAVENOUS
  Filled 2021-10-05: qty 1

## 2021-10-05 NOTE — ED Triage Notes (Addendum)
Pt c/o SOB after knee surgery 2/6-denies fever/flu sx-states she has not used "rescue inhaler" today-has used flovent-to triage in w/c

## 2021-10-05 NOTE — ED Provider Notes (Signed)
[facility] EMERGENCY DEPARTMENT Provider Note   CSN: [id number] Arrival date & time: 10/05/21  1855     History  Chief Complaint  Patient presents with   Shortness of Breath    Samantha Luna is a 61 y.o. female.  Patient presents to the emergency department for evaluation of shortness of breath.  Patient reports that she had left knee replacement surgery on February 6.  Either later that night or the next morning she noticed that she was more short of breath than usual.  Daughter is present, reports increased wheezing.  Patient gets out of breath doing minimal exertion.  No associated chest pain.  She has not had cough.      Home Medications Prior to Admission medications   Medication Sig Start Date End Date Taking? Authorizing Provider  acetaminophen (TYLENOL) 325 MG tablet Take 325-650 mg by mouth every 6 (six) hours as needed for moderate pain.    [provider]  albuterol (VENTOLIN HFA) 108 (90 Base) MCG/ACT inhaler Inhale 1-2 puffs into the lungs every 6 (six) hours as needed for wheezing or shortness of breath.    [provider]  aspirin 325 MG EC tablet Take 1 tablet (325 mg total) by mouth 2 (two) times daily for 20 days. Then resume one 81 mg aspirin once a day. 10/04/21 10/24/21  Edmisten, Ok Anis, PA  buPROPion (WELLBUTRIN SR) 150 MG 12 hr tablet Take 150 mg by mouth 2 (two) times daily.    [provider]  fluticasone (FLOVENT HFA) 220 MCG/ACT inhaler Inhale 2 puffs into the lungs 2 (two) times daily.    [provider]  gabapentin (NEURONTIN) 300 MG capsule Take a 300 mg capsule three times a day for two weeks following surgery.Then take a 300 mg capsule two times a day for two weeks. Then take a 300 mg capsule once a day for two weeks. Then discontinue. 10/04/21   Edmisten, Kristie L, PA  HYDROmorphone (DILAUDID) 2 MG tablet Take 1-2 tablets (2-4 mg total) by mouth every 6 (six) hours as needed for severe pain or moderate pain.  Not to exceed 6 tablets a day. 10/04/21   Edmisten, Kristie L, PA  ibandronate (BONIVA) 150 MG tablet Take 150 mg by mouth every 30 (thirty) days.    [provider]  lidocaine (LIDODERM) 5 % Place 1 patch onto the skin daily as needed (pain). Remove & Discard patch within 12 hours or as directed by MD    [provider]  loratadine (CLARITIN) 10 MG tablet Take 10 mg by mouth daily as needed for allergies.    [provider]  losartan-hydrochlorothiazide (HYZAAR) 100-25 MG tablet Take 1 tablet by mouth daily.    [provider]  methocarbamol (ROBAXIN) 500 MG tablet Take 1 tablet (500 mg total) by mouth every 6 (six) hours as needed for muscle spasms. 10/04/21   Edmisten, Ok Anis, PA  Multiple Vitamins-Minerals (MULTIVITAMIN WITH MINERALS) tablet Take 1 tablet by mouth daily.    [provider]  nicotine (NICODERM CQ - DOSED IN MG/24 HR) 7 mg/24hr patch Place 1 patch (7 mg total) onto the skin daily. 08/19/21   Erskine Emery, MD  nitroGLYCERIN (NITROSTAT) 0.4 MG SL tablet Place 0.4 mg under the tongue every 5 (five) minutes as needed for chest pain.    [provider]  omeprazole (PRILOSEC) 20 MG capsule Take 20 mg by mouth daily. 11/05/20   [provider]  rosuvastatin (CRESTOR) 5 MG tablet  Take 5 mg by mouth daily.    [provider]  traMADol (ULTRAM) 50 MG tablet Take 1-2 tablets (50-100 mg total) by mouth every 6 (six) hours as needed for moderate pain. 10/04/21   Edmisten, Ok Anis, PA  Vitamin D, Ergocalciferol, (DRISDOL) 1.25 MG (50000 UNIT) CAPS capsule Take 50,000 Units by mouth every Sunday. 03/09/21   [provider]      Allergies    Codeine and Iodine    Review of Systems   Review of Systems  Respiratory:  Positive for shortness of breath.    Physical Exam Updated Vital Signs BP (!) 148/71    Pulse 92    Temp 98.4 F (36.9 C) (Oral)    Resp 16    SpO2 98%  Physical Exam Vitals and nursing note  reviewed.  Constitutional:      General: She is not in acute distress.    Appearance: She is well-developed.  HENT:     Head: Normocephalic and atraumatic.  Eyes:     Conjunctiva/sclera: Conjunctivae normal.  Cardiovascular:     Rate and Rhythm: Normal rate and regular rhythm.     Heart sounds: No murmur heard. Pulmonary:     Effort: Pulmonary effort is normal. No respiratory distress.     Breath sounds: Normal breath sounds. Transmitted upper airway sounds present. No wheezing, rhonchi or rales.  Abdominal:     Palpations: Abdomen is soft.     Tenderness: There is no abdominal tenderness.  Musculoskeletal:        General: No swelling.     Cervical back: Neck supple.     Left knee: Swelling present. Decreased range of motion.  Skin:    General: Skin is warm and dry.     Capillary Refill: Capillary refill takes less than 2 seconds.  Neurological:     Mental Status: She is alert.  Psychiatric:        Mood and Affect: Mood normal.    ED Results / Procedures / Treatments   Labs (all labs ordered are listed, but only abnormal results are displayed) Labs Reviewed  CBC WITH DIFFERENTIAL/PLATELET - Abnormal; Notable for the following components:      Result Value   WBC 11.9 (*)    RBC 3.12 (*)    Hemoglobin 9.2 (*)    HCT 27.9 (*)    Abs Immature Granulocytes 0.17 (*)    All other components within normal limits  BASIC METABOLIC PANEL - Abnormal; Notable for the following components:   Sodium 134 (*)    Potassium 3.4 (*)    Glucose, Bld 172 (*)    Calcium 8.5 (*)    All other components within normal limits  D-DIMER, QUANTITATIVE - Abnormal; Notable for the following components:   D-Dimer, Quant 1.55 (*)    All other components within normal limits  BRAIN NATRIURETIC PEPTIDE  TROPONIN I (HIGH SENSITIVITY)  TROPONIN I (HIGH SENSITIVITY)    EKG EKG Interpretation  Date/Time:  Wednesday October 05 2021 21:07:03 EST Ventricular Rate:  95 PR Interval:  148 QRS  Duration: 80 QT Interval:  328 QTC Calculation: 413 R Axis:   32 Text Interpretation: Sinus rhythm Borderline T wave abnormalities Confirmed by Orpah Greek [phone]) on 10/05/2021 9:12:17 PM  Radiology DG Chest Port 1 View  Result Date: 10/05/2021 CLINICAL DATA:  Shortness of breath EXAM: PORTABLE CHEST 1 VIEW COMPARISON:  None. FINDINGS: The heart size and mediastinal contours are within normal limits. Both lungs are clear.  The visualized skeletal structures are unremarkable. IMPRESSION: No active disease. Electronically Signed   By: Elmer Picker M.D.   On: 10/05/2021 20:45    Procedures Procedures    Medications Ordered in ED Medications  diphenhydrAMINE (BENADRYL) capsule 50 mg (has no administration in time range)    Or  diphenhydrAMINE (BENADRYL) injection 50 mg (has no administration in time range)  ipratropium-albuterol (DUONEB) 0.5-2.5 (3) MG/3ML nebulizer solution 3 mL (3 mLs Nebulization Given 10/05/21 1928)  HYDROmorphone (DILAUDID) injection 1 mg (1 mg Intravenous Given 10/05/21 2143)  gabapentin (NEURONTIN) capsule 300 mg (300 mg Oral Given 10/05/21 2143)  methocarbamol (ROBAXIN) tablet 500 mg (500 mg Oral Given 10/05/21 2143)  hydrocortisone sodium succinate (SOLU-CORTEF) 100 MG injection 200 mg (200 mg Intravenous Given 10/05/21 2215)    ED Course/ Medical Decision Making/ A&P                           Medical Decision Making Amount and/or Complexity of Data Reviewed Labs: ordered. Radiology: ordered.  Risk Prescription drug management.   Patient presents to the emergency department for evaluation of cough, wheezing, shortness of breath.  Patient reports that symptoms began the day after she had left knee surgery.  Symptoms seem less likely to be PE but this is certainly in the differential diagnosis as is bronchitis, pneumonia, congestive heart failure.  Patient's work-up has been reassuring other than elevated D-dimer which is not surprising immediately  after surgery.  We will perform CT PE study.  Unfortunately she does have a mild IV dye allergy, will require premedication.  Will sign out to oncoming ER physician to follow-up on CT scan.        Final Clinical Impression(s) / ED Diagnoses Final diagnoses:  SOB (shortness of breath)    Rx / DC Orders ED Discharge Orders     None         Mayce Noyes, Gwenyth Allegra, MD 10/06/21 0011

## 2021-10-06 ENCOUNTER — Encounter: Payer: Self-pay | Admitting: Cardiology

## 2021-10-06 ENCOUNTER — Emergency Department (HOSPITAL_BASED_OUTPATIENT_CLINIC_OR_DEPARTMENT_OTHER): Payer: Medicare HMO

## 2021-10-06 MED ORDER — IOHEXOL 350 MG/ML SOLN
75.0000 mL | Freq: Once | INTRAVENOUS | Status: AC | PRN
  Administered 2021-10-06: 75 mL via INTRAVENOUS

## 2021-10-06 NOTE — ED Provider Notes (Signed)
°  Physical Exam  BP 130/71    Pulse 87    Temp 98.4 F (36.9 C) (Oral)    Resp 18    SpO2 100%   Physical Exam Vitals and nursing note reviewed.  Constitutional:      General: She is not in acute distress.    Appearance: She is well-developed. She is not diaphoretic.  HENT:     Head: Normocephalic and atraumatic.  Cardiovascular:     Rate and Rhythm: Normal rate and regular rhythm.     Heart sounds: No murmur heard.   No friction rub. No gallop.  Pulmonary:     Effort: Pulmonary effort is normal. No respiratory distress.     Breath sounds: Normal breath sounds. No wheezing.  Abdominal:     General: Bowel sounds are normal. There is no distension.     Palpations: Abdomen is soft.     Tenderness: There is no abdominal tenderness.  Musculoskeletal:        General: Normal range of motion.     Cervical back: Normal range of motion and neck supple.     Right lower leg: No tenderness. No edema.     Left lower leg: No tenderness. No edema.  Skin:    General: Skin is warm and dry.  Neurological:     General: No focal deficit present.     Mental Status: She is alert and oriented to person, place, and time.    Procedures  Procedures  ED Course / MDM  Care assumed from Dr. Betsey Holiday at shift change.  Patient awaiting results of CTA of the chest.  Patient underwent knee replacement surgery 3 days ago.  Today she was getting up, became more short of breath.  Thus far work-up unremarkable, but D-dimer slightly elevated and CTA pending.  Due to contrast allergy, there was a delay in getting this study performed.  Study has been been done and has resulted as negative for pulmonary embolism.  Patient reevaluated and oxygen saturations are 94% on room air.  She is comfortable appearing and I believe can safely be discharged.  Exact cause of her dyspnea is unclear, however nothing emergent has been found.  Patient to return as needed for any problems.       Veryl Speak, MD 10/06/21  [phone]

## 2021-10-12 NOTE — Discharge Summary (Signed)
Patient ID: Samantha Luna MRN: [id number] DOB/AGE: Jul 15, 1961 61 y.o.  Admit date: 10/03/2021 Discharge date: 10/04/2021  Admission Diagnoses:  Principal Problem:   OA (osteoarthritis) of knee Active Problems:   Primary osteoarthritis of left knee   Discharge Diagnoses:  Same  Past Medical History:  Diagnosis Date   Acute exacerbation of chronic obstructive airways disease ([place]) 11/09/2017   Hospitalization 11/09/17 in [place]   Arthritis    Asthma    Bell's palsy 11/10/2020   hx of bewlls palsy   Complication of anesthesia    hysterectomy - slow to wake up   Congestive heart failure ([place]) 04/26/2010   Echo 11/13/20 in [place] showed normal ejection fraction, normal echo.    GERD (gastroesophageal reflux disease)    Headache    Hyperlipidemia 08/18/2021   Hypertension    Meningioma ([place]) 04/23/2010   01/28/13 Head CT for headaches: A left posterior parafalcine meningioma is seen that measures 1.5 cm,  previously measuring 1.3 cm. Basal cisterns and sulci are unremarkable. Impression:  Left posterior parafalcine meningioma is mildly increased in size since  prior without significant surrounding mass effect or acute process visualized.   Acute or subacute ischemia is not excluded with CT.    Peripheral vascular disease ([place])    Pneumonia    Pre-diabetes    Restrictive lung disease 03/05/2018   Sleep apnea     Surgeries: Procedure(s): TOTAL KNEE ARTHROPLASTY on 10/03/2021   Consultants:   Discharged Condition: Improved  Hospital Course: Sheneka Schrom is an 61 y.o. female who was admitted 10/03/2021 for operative treatment ofOA (osteoarthritis) of knee. Patient has severe unremitting pain that affects sleep, daily activities, and work/hobbies. After pre-op clearance the patient was taken to the operating room on 10/03/2021 and underwent  Procedure(s): TOTAL KNEE ARTHROPLASTY.    Patient was given perioperative antibiotics:  Anti-infectives (From admission, onward)     Start     Dose/Rate Route Frequency Ordered Stop   10/03/21 1600  ceFAZolin (ANCEF) IVPB 2g/100 mL premix        2 g 200 mL/hr over 30 Minutes Intravenous Every 6 hours 10/03/21 1300 10/03/21 2203   10/03/21 0715  ceFAZolin (ANCEF) IVPB 2g/100 mL premix        2 g 200 mL/hr over 30 Minutes Intravenous On call to O.R. 10/03/21 7408 10/03/21 1448        Patient was given sequential compression devices, early ambulation, and chemoprophylaxis to prevent DVT.  Patient benefited maximally from hospital stay and there were no complications.    Recent vital signs: No data found.   Recent laboratory studies: No results for input(s): WBC, HGB, HCT, PLT, NA, K, CL, CO2, BUN, CREATININE, GLUCOSE, INR, CALCIUM in the last 72 hours.  Invalid input(s): PT, 2   Discharge Medications:   Allergies as of 10/04/2021       Reactions   Codeine Itching   Iodine    Uncontrollable sneezing         Medication List     STOP taking these medications    diclofenac 75 MG EC tablet Commonly known as: VOLTAREN   naproxen 500 MG tablet Commonly known as: NAPROSYN       TAKE these medications    acetaminophen 325 MG tablet Commonly known as: TYLENOL Take 325-650 mg by mouth every 6 (six) hours as needed for moderate pain.   albuterol 108 (90 Base) MCG/ACT inhaler Commonly known as: VENTOLIN HFA Inhale 1-2 puffs into the lungs every  6 (six) hours as needed for wheezing or shortness of breath.   aspirin 325 MG EC tablet Take 1 tablet (325 mg total) by mouth 2 (two) times daily for 20 days. Then resume one 81 mg aspirin once a day. What changed:  medication strength how much to take when to take this additional instructions   buPROPion 150 MG 12 hr tablet Commonly known as: WELLBUTRIN SR Take 150 mg by mouth 2 (two) times daily.   fluticasone 220 MCG/ACT inhaler Commonly known as: FLOVENT HFA Inhale 2 puffs into the lungs 2 (two) times daily.   gabapentin 300 MG  capsule Commonly known as: NEURONTIN Take a 300 mg capsule three times a day for two weeks following surgery.Then take a 300 mg capsule two times a day for two weeks. Then take a 300 mg capsule once a day for two weeks. Then discontinue.   HYDROmorphone 2 MG tablet Commonly known as: DILAUDID Take 1-2 tablets (2-4 mg total) by mouth every 6 (six) hours as needed for severe pain or moderate pain. Not to exceed 6 tablets a day.   ibandronate 150 MG tablet Commonly known as: BONIVA Take 150 mg by mouth every 30 (thirty) days.   lidocaine 5 % Commonly known as: LIDODERM Place 1 patch onto the skin daily as needed (pain). Remove & Discard patch within 12 hours or as directed by MD   loratadine 10 MG tablet Commonly known as: CLARITIN Take 10 mg by mouth daily as needed for allergies.   losartan-hydrochlorothiazide 100-25 MG tablet Commonly known as: HYZAAR Take 1 tablet by mouth daily.   methocarbamol 500 MG tablet Commonly known as: ROBAXIN Take 1 tablet (500 mg total) by mouth every 6 (six) hours as needed for muscle spasms.   multivitamin with minerals tablet Take 1 tablet by mouth daily.   nicotine 7 mg/24hr patch Commonly known as: NICODERM CQ - dosed in mg/24 hr Place 1 patch (7 mg total) onto the skin daily.   nitroGLYCERIN 0.4 MG SL tablet Commonly known as: NITROSTAT Place 0.4 mg under the tongue every 5 (five) minutes as needed for chest pain.   omeprazole 20 MG capsule Commonly known as: PRILOSEC Take 20 mg by mouth daily.   rosuvastatin 5 MG tablet Commonly known as: CRESTOR Take 5 mg by mouth daily.   traMADol 50 MG tablet Commonly known as: ULTRAM Take 1-2 tablets (50-100 mg total) by mouth every 6 (six) hours as needed for moderate pain.   Vitamin D (Ergocalciferol) 1.25 MG (50000 UNIT) Caps capsule Commonly known as: DRISDOL Take 50,000 Units by mouth every Sunday.               Discharge Care Instructions  (From admission, onward)            Start     Ordered   10/04/21 0000  Weight bearing as tolerated        02 /07/23 0732   10/04/21 0000  Change dressing       Comments: You may remove the bulky bandage (ACE wrap and gauze) two days after surgery. You will have an adhesive waterproof bandage underneath. Leave this in place until your first follow-up appointment.   10/04/21 0732            Diagnostic Studies: CT Angio Chest Pulmonary Embolism (PE) W or WO Contrast  Result Date: 10/06/2021 CLINICAL DATA:  Pulmonary embolism (PE) suspected, positive D-dimer EXAM: CT ANGIOGRAPHY CHEST WITH CONTRAST TECHNIQUE: Multidetector CT imaging of the chest  was performed using the standard protocol during bolus administration of intravenous contrast. Multiplanar CT image reconstructions and MIPs were obtained to evaluate the vascular anatomy. RADIATION DOSE REDUCTION: This exam was performed according to the departmental dose-optimization program which includes automated exposure control, adjustment of the mA and/or kV according to patient size and/or use of iterative reconstruction technique. CONTRAST:  75 mL Omnipaque 350 IV COMPARISON:  08/18/2021 FINDINGS: Cardiovascular: No filling defects in the pulmonary arteries to suggest pulmonary emboli. Heart is normal size. Aorta is normal caliber. Mediastinum/Nodes: No mediastinal, hilar, or axillary adenopathy. Trachea and esophagus are unremarkable. Thyroid unremarkable Lungs/Pleura: Lungs are clear. No focal airspace opacities or suspicious nodules. No effusions. Upper Abdomen: No acute abnormality Musculoskeletal: Chest wall soft tissues are unremarkable. No acute bony abnormality. Review of the MIP images confirms the above findings. IMPRESSION: No evidence of pulmonary embolus. No acute cardiopulmonary disease. Electronically Signed   By: Rolm Baptise M.D.   On: 10/06/2021 02:53   DG Chest Port 1 View  Result Date: 10/05/2021 CLINICAL DATA:  Shortness of breath EXAM: PORTABLE CHEST 1 VIEW  COMPARISON:  None. FINDINGS: The heart size and mediastinal contours are within normal limits. Both lungs are clear. The visualized skeletal structures are unremarkable. IMPRESSION: No active disease. Electronically Signed   By: Elmer Picker M.D.   On: 10/05/2021 20:45    Disposition: Discharge disposition: 01-Home or Self Care       Discharge Instructions     Call MD / Call 911   Complete by: As directed    If you experience chest pain or shortness of breath, CALL 911 and be transported to the hospital emergency room.  If you develope a fever above 101 F, pus (white drainage) or increased drainage or redness at the wound, or calf pain, call your surgeon's office.   Change dressing   Complete by: As directed    You may remove the bulky bandage (ACE wrap and gauze) two days after surgery. You will have an adhesive waterproof bandage underneath. Leave this in place until your first follow-up appointment.   Constipation Prevention   Complete by: As directed    Drink plenty of fluids.  Prune juice may be helpful.  You may use a stool softener, such as Colace (over the counter) 100 mg twice a day.  Use MiraLax (over the counter) for constipation as needed.   Diet - low sodium heart healthy   Complete by: As directed    Do not put a pillow under the knee. Place it under the heel.   Complete by: As directed    Driving restrictions   Complete by: As directed    No driving for two weeks   Post-operative opioid taper instructions:   Complete by: As directed    POST-OPERATIVE OPIOID TAPER INSTRUCTIONS: It is important to wean off of your opioid medication as soon as possible. If you do not need pain medication after your surgery it is ok to stop day one. Opioids include: Codeine, Hydrocodone(Norco, Vicodin), Oxycodone(Percocet, oxycontin) and hydromorphone amongst others.  Long term and even short term use of opiods can cause: Increased pain  response Dependence Constipation Depression Respiratory depression And more.  Withdrawal symptoms can include Flu like symptoms Nausea, vomiting And more Techniques to manage these symptoms Hydrate well Eat regular healthy meals Stay active Use relaxation techniques(deep breathing, meditating, yoga) Do Not substitute Alcohol to help with tapering If you have been on opioids for less than two weeks and do not  have pain than it is ok to stop all together.  Plan to wean off of opioids This plan should start within one week post op of your joint replacement. Maintain the same interval or time between taking each dose and first decrease the dose.  Cut the total daily intake of opioids by one tablet each day Next start to increase the time between doses. The last dose that should be eliminated is the evening dose.      TED hose   Complete by: As directed    Use stockings (TED hose) for three weeks on both leg(s).  You may remove them at night for sleeping.   Weight bearing as tolerated   Complete by: As directed         Follow-up Information     Gaynelle Arabian, MD. Go on 10/18/2021.   Specialty: Orthopedic Surgery Why: You are scheduled for first post op appointment on Tuesday February 21st at 4:00pm. Contact information: [address] [place] [address] [phone]                  Signed: Theresa Duty 10/12/2021, 10:53 AM

## 2021-11-08 ENCOUNTER — Ambulatory Visit (INDEPENDENT_AMBULATORY_CARE_PROVIDER_SITE_OTHER): Payer: Medicare HMO | Admitting: Pulmonary Disease

## 2021-11-08 ENCOUNTER — Encounter: Payer: Self-pay | Admitting: Pulmonary Disease

## 2021-11-08 ENCOUNTER — Other Ambulatory Visit: Payer: Self-pay

## 2021-11-08 VITALS — BP 128/84 | HR 87 | Temp 98.0°F | Ht 61.0 in | Wt 198.0 lb

## 2021-11-08 DIAGNOSIS — G4733 Obstructive sleep apnea (adult) (pediatric): Secondary | ICD-10-CM | POA: Diagnosis not present

## 2021-11-08 NOTE — Patient Instructions (Signed)
We will contact medical supply company for CPAP supplies ? ?We will schedule you for home sleep study to confirm whether you still have sleep apnea ? ?Continue weight loss efforts ? ?I will see you tentatively in 3 to 4 months ?

## 2021-11-08 NOTE — Progress Notes (Signed)
? ?      ?Samantha Luna    [id number]    01-20-1961 ? ?Primary Care Physician:Roberson, Carmell Austria, MD ? ?Referring Physician: Wendie Simmer, MD ?[address] ?[place],  [place] [address] ? ?Chief complaint:   ?Patient with a history of obstructive sleep apnea ?Has tried to get back to using CPAP on a nightly basis ? ?HPI: ? ?Has been having issues with CPAP supplies, mask leak, rainout ?She will like to get CPAP supplies ? ?Post knee surgery which she tolerated well ? ?She wonders whether she still needs CPAP ?She used to using CPAP on a regular basis until she got away from it, was concerned about machine recall ? ?She currently she recollects that the study showed that she stop breathing about 11 times an hour ?[place] ?Used CPAP regularly and then got away from using it ?Usually goes to bed about 7 PM, final wake up time about 4 AM, she does have about 1 or 2 awakenings ?Denies any headaches in the morning ?She does have dryness of her mouth in the mornings ?No family history of sleep apnea known to her ?She does have a history of hypertension, has had a negative stress test recently, does have a history of angina ? ?PFT from 2020 did reveal mild obstructive disease, mild restriction ? ?Knee feels a lot better since surgery, tolerating rehab ? ?Outpatient Encounter Medications as of 11/08/2021  ?Medication Sig  ? acetaminophen (TYLENOL) 325 MG tablet Take 325-650 mg by mouth every 6 (six) hours as needed for moderate pain.  ? albuterol (VENTOLIN HFA) 108 (90 Base) MCG/ACT inhaler Inhale 1-2 puffs into the lungs every 6 (six) hours as needed for wheezing or shortness of breath.  ? buPROPion (WELLBUTRIN SR) 150 MG 12 hr tablet Take 150 mg by mouth 2 (two) times daily.  ? fluticasone (FLOVENT HFA) 220 MCG/ACT inhaler Inhale 2 puffs into the lungs 2 (two) times daily.  ? gabapentin (NEURONTIN) 300 MG capsule Take a 300 mg capsule three times a day for two weeks following surgery.Then take a  300 mg capsule two times a day for two weeks. Then take a 300 mg capsule once a day for two weeks. Then discontinue.  ? ibandronate (BONIVA) 150 MG tablet Take 150 mg by mouth every 30 (thirty) days.  ? lidocaine (LIDODERM) 5 % Place 1 patch onto the skin daily as needed (pain). Remove & Discard patch within 12 hours or as directed by MD  ? loratadine (CLARITIN) 10 MG tablet Take 10 mg by mouth daily as needed for allergies.  ? losartan-hydrochlorothiazide (HYZAAR) 100-25 MG tablet Take 1 tablet by mouth daily.  ? methocarbamol (ROBAXIN) 500 MG tablet Take 1 tablet (500 mg total) by mouth every 6 (six) hours as needed for muscle spasms.  ? Multiple Vitamins-Minerals (MULTIVITAMIN WITH MINERALS) tablet Take 1 tablet by mouth daily.  ? nitroGLYCERIN (NITROSTAT) 0.4 MG SL tablet Place 0.4 mg under the tongue every 5 (five) minutes as needed for chest pain.  ? omeprazole (PRILOSEC) 20 MG capsule Take 20 mg by mouth daily.  ? rosuvastatin (CRESTOR) 5 MG tablet Take 5 mg by mouth daily.  ? traMADol (ULTRAM) 50 MG tablet Take 1-2 tablets (50-100 mg total) by mouth every 6 (six) hours as needed for moderate pain.  ? Vitamin D, Ergocalciferol, (DRISDOL) 1.25 MG (50000 UNIT) CAPS capsule Take 50,000 Units by mouth every Sunday.  ? [DISCONTINUED] HYDROmorphone (DILAUDID) 2 MG tablet Take 1-2 tablets (2-4  mg total) by mouth every 6 (six) hours as needed for severe pain or moderate pain. Not to exceed 6 tablets a day. (Patient not taking: Reported on 11/08/2021)  ? [DISCONTINUED] nicotine (NICODERM CQ - DOSED IN MG/24 HR) 7 mg/24hr patch Place 1 patch (7 mg total) onto the skin daily. (Patient not taking: Reported on 11/08/2021)  ? ?No facility-administered encounter medications on file as of 11/08/2021.  ? ? ?Allergies as of 11/08/2021 - Review Complete 11/08/2021  ?Allergen Reaction Noted  ? Iodine Other (See Comments) 05/16/2021  ? Codeine Itching 05/16/2021  ? ? ?Past Medical History:  ?Diagnosis Date  ? Acute exacerbation of  chronic obstructive airways disease ([place]) 11/09/2017  ? Hospitalization 11/09/17 in [place]  ? Arthritis   ? Asthma   ? Bell's palsy 11/10/2020  ? hx of bewlls palsy  ? Complication of anesthesia   ? hysterectomy - slow to wake up  ? Congestive heart failure ([place]) 04/26/2010  ? Echo 11/13/20 in [place] showed normal ejection fraction, normal echo.   ? GERD (gastroesophageal reflux disease)   ? Headache   ? Hyperlipidemia 08/18/2021  ? Hypertension   ? Meningioma ([place]) 04/23/2010  ? 01/28/13 Head CT for headaches: A left posterior parafalcine meningioma is seen that measures 1.5 cm,  previously measuring 1.3 cm. Basal cisterns and sulci are unremarkable. Impression:  Left posterior parafalcine meningioma is mildly increased in size since  prior without significant surrounding mass effect or acute process visualized.   Acute or subacute ischemia is not excluded with CT.   ? Peripheral vascular disease ([place])   ? Pneumonia   ? Pre-diabetes   ? Restrictive lung disease 03/05/2018  ? Sleep apnea   ? ? ?Past Surgical History:  ?Procedure Laterality Date  ? ABDOMINAL HYSTERECTOMY    ? APPENDECTOMY    ? meniscus repair on right knee     ? TONSILLECTOMY    ? TOTAL KNEE ARTHROPLASTY Left 10/03/2021  ? Procedure: TOTAL KNEE ARTHROPLASTY;  Surgeon: Gaynelle Arabian, MD;  Location: [facility];  Service: Orthopedics;  Laterality: Left;  ? ? ?Family History  ?Problem Relation Age of Onset  ? Dementia Father   ? Asthma Sister   ? Immunodeficiency Daughter   ? Asthma Daughter   ? Asthma Daughter   ? Immunodeficiency Grandson   ? Asthma Grandson   ? ? ?Social History  ? ?Socioeconomic History  ? Marital status: Single  ?  Spouse name: Not on file  ? Number of children: Not on file  ? Years of education: Not on file  ? Highest education level: Not on file  ?Occupational History  ? 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  ?Tobacco Use  ? Smoking status: Former  ?  Packs/day: 1.00  ?  Years: 40.00  ?  Pack years: 40.00   ?  Types: Cigarettes  ?  Quit date: 08/28/2021  ?  Years since quitting: 0.1  ? Smokeless tobacco: Never  ?Vaping Use  ? Vaping Use: Never used  ?Substance and Sexual Activity  ? Alcohol use: Yes  ?  Comment: rare  ? Drug use: Never  ? Sexual activity: Not Currently  ?Other Topics Concern  ? Not on file  ?Social History Narrative  ? Not on file  ? ?Social Determinants of Health  ? ?Financial Resource Strain: Not on file  ?Food Insecurity: Not on file  ?Transportation Needs: Not on file  ?Physical Activity: Not on file  ?Stress: Not on file  ?  Social Connections: Not on file  ?Intimate Partner Violence: Not on file  ? ? ?Review of Systems  ?Constitutional:  Negative for fatigue and fever.  ?Respiratory:  Positive for shortness of breath.   ?Psychiatric/Behavioral:  Positive for sleep disturbance.   ? ?There were no vitals filed for this visit. ? ? ? ?Physical Exam ?Constitutional:   ?   Appearance: She is obese.  ?HENT:  ?   Head: Normocephalic.  ?   Right Ear: Tympanic membrane normal.  ?   Nose: No congestion.  ?   Mouth/Throat:  ?   Mouth: Mucous membranes are moist.  ?   Comments: Mallampati 4, crowded oropharynx, macroglossia ?Eyes:  ?   Pupils: Pupils are equal, round, and reactive to light.  ?Cardiovascular:  ?   Rate and Rhythm: Normal rate and regular rhythm.  ?   Heart sounds: No murmur heard. ?  No friction rub.  ?Pulmonary:  ?   Effort: No respiratory distress.  ?   Breath sounds: No stridor. No wheezing or rhonchi.  ?Musculoskeletal:  ?   Cervical back: No rigidity or tenderness.  ?Neurological:  ?   Mental Status: She is alert.  ?Psychiatric:     ?   Mood and Affect: Mood normal.  ? ?Results of the Epworth flowsheet 09/09/2021  ?Sitting and reading 3  ?Watching TV 3  ?Sitting, inactive in a public place (e.g. a theatre or a meeting) 2  ?As a passenger in a car for an hour without a break 2  ?Lying down to rest in the afternoon when circumstances permit 3  ?Sitting and talking to someone 2  ?Sitting quietly  after a lunch without alcohol 2  ?In a car, while stopped for a few minutes in traffic 0  ?Total score 17  ? ?Data Reviewed: ? ?Compliance data reveals 47% compliance ?Machine set at 6 ?95 percentile

## 2021-11-09 NOTE — Addendum Note (Signed)
Addended byDessie Coma on: 11/09/2021 09:16 AM ? ? Modules accepted: Orders ? ?

## 2021-12-23 ENCOUNTER — Ambulatory Visit: Payer: Medicare HMO

## 2021-12-23 DIAGNOSIS — G4733 Obstructive sleep apnea (adult) (pediatric): Secondary | ICD-10-CM

## 2021-12-28 ENCOUNTER — Telehealth: Payer: Self-pay | Admitting: Pulmonary Disease

## 2021-12-28 DIAGNOSIS — G4733 Obstructive sleep apnea (adult) (pediatric): Secondary | ICD-10-CM

## 2021-12-28 NOTE — Telephone Encounter (Signed)
Call patient ? ?Sleep study result ? ?Date of study: ?12/23/2021 ? ?Impression: ?Mild obstructive sleep apnea ?Mild oxygen desaturations ? ?Recommendation: ?Options of treatment for mild obstructive sleep apnea will include ? ?1.  CPAP therapy if there is significant daytime sleepiness or other comorbidities including history of CVA or cardiac disease ? ?-If CPAP is chosen as an option of treatment auto titrating CPAP with a pressure setting of 4-15 will be appropriate ? ?2.  Watchful waiting with emphasis on weight loss measures, sleep position modification to optimize lateral sleep, elevating the head of the bed by about 30 degrees may also help. ? ?3.  An oral device may be fashioned for the treatment of mild sleep disordered breathing, will involve referral to dentist. ? ? ?Follow-up as previously scheduled ?

## 2021-12-30 ENCOUNTER — Encounter: Payer: Self-pay | Admitting: Physician Assistant

## 2022-01-03 NOTE — Telephone Encounter (Signed)
Patient is returning phone call. Patient phone number is [phone]. ?

## 2022-01-03 NOTE — Telephone Encounter (Signed)
I called the patient was not able to leave a VM as the voice mail was not set up.  ?

## 2022-01-06 DIAGNOSIS — F411 Generalized anxiety disorder: Secondary | ICD-10-CM

## 2022-01-06 HISTORY — DX: Generalized anxiety disorder: F41.1

## 2022-01-06 NOTE — Telephone Encounter (Signed)
I called the patient and she wants to hold off on the CPAP as she reprots that she does not feel her CPAP has worked in the past as it was smothering her. She will talk at the follow up visit. Nothing further needed.  ?

## 2022-01-24 ENCOUNTER — Ambulatory Visit (INDEPENDENT_AMBULATORY_CARE_PROVIDER_SITE_OTHER): Payer: Medicare HMO | Admitting: Psychology

## 2022-01-24 ENCOUNTER — Encounter: Payer: Self-pay | Admitting: Psychology

## 2022-01-24 ENCOUNTER — Ambulatory Visit: Payer: Medicare HMO

## 2022-01-24 DIAGNOSIS — R4189 Other symptoms and signs involving cognitive functions and awareness: Secondary | ICD-10-CM

## 2022-01-24 DIAGNOSIS — I739 Peripheral vascular disease, unspecified: Secondary | ICD-10-CM | POA: Insufficient documentation

## 2022-01-24 DIAGNOSIS — G4733 Obstructive sleep apnea (adult) (pediatric): Secondary | ICD-10-CM

## 2022-01-24 DIAGNOSIS — F330 Major depressive disorder, recurrent, mild: Secondary | ICD-10-CM

## 2022-01-24 DIAGNOSIS — I6781 Acute cerebrovascular insufficiency: Secondary | ICD-10-CM

## 2022-01-24 DIAGNOSIS — R7303 Prediabetes: Secondary | ICD-10-CM | POA: Insufficient documentation

## 2022-01-24 DIAGNOSIS — F329 Major depressive disorder, single episode, unspecified: Secondary | ICD-10-CM | POA: Insufficient documentation

## 2022-01-24 NOTE — Progress Notes (Signed)
   Psychometrician Note   Cognitive testing was administered to Pecola Leisure by Cruzita Lederer, B.S. (psychometrist) under the supervision of Dr. Christia Reading, Ph.D., licensed psychologist on 01/24/2022. Ms. Ramthun did not appear overtly distressed by the testing session per behavioral observation or responses across self-report questionnaires. Rest breaks were offered.    The battery of tests administered was selected by Dr. Christia Reading, Ph.D. with consideration to Ms. Grothe's current level of functioning, the nature of her symptoms, emotional and behavioral responses during interview, level of literacy, observed level of motivation/effort, and the nature of the referral question. This battery was communicated to the psychometrist. Communication between Dr. Christia Reading, Ph.D. and the psychometrist was ongoing throughout the evaluation and Dr. Christia Reading, Ph.D. was immediately accessible at all times. Dr. Christia Reading, Ph.D. provided supervision to the psychometrist on the date of this service to the extent necessary to assure the quality of all services provided.    Marylynne Keelin will return within approximately 1-2 weeks for an interactive feedback session with Dr. Melvyn Novas at which time her test performances, clinical impressions, and treatment recommendations will be reviewed in detail. Ms. Releford understands she can contact our office should she require our assistance before this time.  A total of 130 minutes of billable time were spent face-to-face with Ms. Lamartina by the psychometrist. This includes both test administration and scoring time. Billing for these services is reflected in the clinical report generated by Dr. Christia Reading, Ph.D.  This note reflects time spent with the psychometrician and does not include test scores or any clinical interpretations made by Dr. Melvyn Novas. The full report will follow in a separate note.

## 2022-01-24 NOTE — Progress Notes (Signed)
NEUROPSYCHOLOGICAL EVALUATION [place] [facility] Department of Neurology  Date of Evaluation: Jan 24, 2022  Reason for Referral:   Samantha Luna is a 61 y.o. right-handed African-American female referred by  Sharene Butters, PA-C , to characterize her current cognitive functioning and assist with diagnostic clarity and treatment planning in the context of subjective cognitive decline.   Assessment and Plan:   Clinical Impression(s): [name] pattern of performance is suggestive of neuropsychological functioning largely within normal limits relative to age-matched peers. A primary weakness was exhibited encoding (i.e., learning) novel verbal information. Additional performance variability was exhibited across executive functioning. Performances were appropriate across processing speed, attention/concentration, safety/judgment, receptive and expressive language, visuospatial abilities, and delayed retrieval/consolidation aspects of memory. Currently, I do not believe there is sufficient cognitive dysfunction to warrant a formal diagnosis surrounding a neurocognitive disorder. Functionally, [name] lives alone and largely denied difficulties completing instrumental activities of daily living (ADLs) independently.  Regarding etiology, there could be a vascular contribution to ongoing dysfunction given that her recent brain MRI revealed mild to moderate microvascular ischemic changes. Her pattern of deficits would align well with this underlying etiology. Additionally, across mood-related questionnaires, [name] described mild to moderate psychiatric distress (i.e., anxiety and depression). She also reported mild sleep dysfunction and ongoing chronic pain. These variables, especially when combined together, can create and/or worsen cognitive inefficiencies. Weaknesses across testing are certainly reasonable findings given the presence of these variables and it may be that a  combination of vascular and psychiatric factors is the best explanation for ongoing day-to-day dysfunction. Trouble with executive functioning can reflect trouble with multi-tasking, organization, quickly synthesizing information, and some behavioral dysregulation or personality changes. Specific to memory, [name] was able to demonstrate appropriate retrieval and consolidation aspects of memory. Put more plainly, while she has trouble learning new information, she does not appear to have trouble retaining what she is able to learn. Individuals will typically have trouble recalling information that was poorly learned (or not learned at all) at a later time. While this can feel like memory loss in the moment, testing would suggest greater dysfunction on the front end of this process rather than the back end. This is important to highlight, particularly as it suggests that [name]'s memory profile is not concerning for Alzheimer's disease at the present time. Likewise, her cognitive and behavioral profile is not suggestive of any other form of neurodegenerative illness presently.  Recommendations: A repeat neuropsychological evaluation in 24-36 months (or sooner if functional decline is noted) is recommended to assess the trajectory of future cognitive decline should it occur. This will also aid in future efforts towards improved diagnostic clarity.  A combination of medication and psychotherapy has been shown to be most effective at treating symptoms of anxiety and depression. As such, [name] is encouraged to speak with her prescribing physician regarding medication adjustments to optimally manage these symptoms.   Likewise, [name] is encouraged to consider engaging in short-term psychotherapy to address symptoms of psychiatric distress. She would benefit from an active and collaborative therapeutic environment, rather than one purely supportive in nature. Recommended treatment modalities include  Cognitive Behavioral Therapy (CBT) or Acceptance and Commitment Therapy (ACT).  Untreated or poorly managed sleep apnea can create and/or worsen cognitive dysfunction. It will also double her risk for stroke, as well as increase her risk for heart attack and dementia. As such, she is strongly encouraged to utilize her CPAP machine on a nightly basis. If  she continues to have a choking sensation, she should meet with her sleep specialist to ensure that her current settings are appropriate/optimized.   [name] is encouraged to attend to lifestyle factors for brain health (e.g., regular physical exercise, good nutrition habits, regular participation in cognitively-stimulating activities, and general stress management techniques), which are likely to have benefits for both emotional adjustment and cognition. In fact, in addition to promoting good general health, regular exercise incorporating aerobic activities (e.g., brisk walking, jogging, cycling, etc.) has been demonstrated to be a very effective treatment for depression and stress, with similar efficacy rates to both antidepressant medication and psychotherapy. Optimal control of vascular risk factors (including safe cardiovascular exercise and adherence to dietary recommendations) is encouraged. Continued participation in activities which provide mental stimulation and social interaction is also recommended.   Memory can be improved using internal strategies such as rehearsal, repetition, chunking, mnemonics, association, and imagery. External strategies such as written notes in a consistently used memory journal, visual and nonverbal auditory cues such as a calendar on the refrigerator or appointments with alarm, such as on a cell phone, can also help maximize recall.    Because she shows better recall for structured information, she will likely understand and retain new information better if it is presented to her in a meaningful or well-organized  manner at the outset, such as grouping items into meaningful categories or presenting information in an outlined, bulleted, or story format.   To address problems with executive dysfunction, she may wish to consider:   -Avoiding external distractions when needing to concentrate   -Limiting exposure to fast paced environments with multiple sensory demands   -Writing down complicated information and using checklists   -Attempting and completing one task at a time (i.e., no multi-tasking)   -Verbalizing aloud each step of a task to maintain focus   -Reducing the amount of information considered at one time  Review of Records:   Ms. Mangen was seen by [facility] Sharene Butters, PA-C) on 08/10/2021 for an evaluation of memory loss. At that time, concerns were said to be present for the past year and appeared to first become noticeable following the passing of her sister. She reported repeating the same stories, asking repetitive questions, and entering rooms and forgetting her original intention. She also described some word finding difficulties. Her daughter reported her mother experiencing significant grief after several close family members passed, including her sister in June 2021. Her daughter reported a prior coping mechanism of bottling these emotions up. However, Ms. Freimuth was in the process of seeking therapeutic help to process these emotions. Ms. Bunten lives alone and denied any trouble with ADLs. There is a history of sleep apnea with variable CPAP adherence due to ongoing mask discomfort. There is also report of ongoing chronic pain. Performance on a brief cognitive screening instrument (MOCA) was 23/30. Ultimately, Ms. Lepage was referred for a comprehensive neuropsychological evaluation to characterize her cognitive abilities and to assist with diagnostic clarity and treatment planning.   Brain MRI on 09/08/2021 revealed mild microvascular ischemic changes, with more moderate severity  specifically in the pons, as well as a 13 mm dural based meningioma located in the left occipital pole.   Past Medical History:  Diagnosis Date   Acute exacerbation of chronic obstructive airways disease 11/09/2017   Hospitalization 11/09/17 in [place]   Allergic contact dermatitis 09/12/2021   Asthma    Atherosclerosis of native arteries of extremities with intermittent claudication,  right leg 08/11/2019   Bell's palsy    Chest pain 04/54/0981   Complication of anesthesia    hysterectomy - slow to wake up   Congestive heart failure 04/26/2010   Echo 11/13/20 in [place] showed normal ejection fraction, normal echo.    COPD (chronic obstructive pulmonary disease) 02/11/2016   Edema of both legs 04/26/2010   GERD (gastroesophageal reflux disease)    Headache    High cholesterol 02/11/2016   History of COVID-19 07/20/2020   Laryngopharyngeal reflux 08/14/2018   Major depressive disorder    Meningioma 04/23/2010   01/28/13 Head CT: A left posterior parafalcine meningioma is seen that measures 1.5 cm,  previously measuring 1.3 cm. Basal cisterns and sulci are unremarkable. Impression:  Left posterior parafalcine meningioma is mildly increased in size since  prior without significant surrounding mass effect or acute process visualized.   Acute or subacute ischemia is not excluded with CT.   Mixed hyperlipidemia 08/25/2021   Obstructive sleep apnea 08/18/2021   Osteoarthritis of right knee 11/10/2020   Palpitations 01/06/2019   Peripheral vascular disease    Pneumonia    Pre-diabetes    Primary hypertension 08/25/2021   Primary osteoarthritis of left knee 10/03/2021   Restrictive lung disease 03/05/2018    Past Surgical History:  Procedure Laterality Date   ABDOMINAL HYSTERECTOMY     APPENDECTOMY     meniscus repair on right knee      TONSILLECTOMY     TOTAL KNEE ARTHROPLASTY Left 10/03/2021   Procedure: TOTAL KNEE ARTHROPLASTY;  Surgeon: Gaynelle Arabian, MD;   Location: [facility];  Service: Orthopedics;  Laterality: Left;    Current Outpatient Medications:    acetaminophen (TYLENOL) 325 MG tablet, Take 325-650 mg by mouth every 6 (six) hours as needed for moderate pain., Disp: , Rfl:    albuterol (VENTOLIN HFA) 108 (90 Base) MCG/ACT inhaler, Inhale 1-2 puffs into the lungs every 6 (six) hours as needed for wheezing or shortness of breath., Disp: , Rfl:    buPROPion (WELLBUTRIN SR) 150 MG 12 hr tablet, Take 150 mg by mouth 2 (two) times daily., Disp: , Rfl:    fluticasone (FLOVENT HFA) 220 MCG/ACT inhaler, Inhale 2 puffs into the lungs 2 (two) times daily., Disp: , Rfl:    gabapentin (NEURONTIN) 300 MG capsule, Take a 300 mg capsule three times a day for two weeks following surgery.Then take a 300 mg capsule two times a day for two weeks. Then take a 300 mg capsule once a day for two weeks. Then discontinue., Disp: 84 capsule, Rfl: 0   ibandronate (BONIVA) 150 MG tablet, Take 150 mg by mouth every 30 (thirty) days., Disp: , Rfl:    lidocaine (LIDODERM) 5 %, Place 1 patch onto the skin daily as needed (pain). Remove & Discard patch within 12 hours or as directed by MD, Disp: , Rfl:    loratadine (CLARITIN) 10 MG tablet, Take 10 mg by mouth daily as needed for allergies., Disp: , Rfl:    losartan-hydrochlorothiazide (HYZAAR) 100-25 MG tablet, Take 1 tablet by mouth daily., Disp: , Rfl:    methocarbamol (ROBAXIN) 500 MG tablet, Take 1 tablet (500 mg total) by mouth every 6 (six) hours as needed for muscle spasms., Disp: 40 tablet, Rfl: 0   Multiple Vitamins-Minerals (MULTIVITAMIN WITH MINERALS) tablet, Take 1 tablet by mouth daily., Disp: , Rfl:    nitroGLYCERIN (NITROSTAT) 0.4 MG SL tablet, Place 0.4 mg under the tongue every 5 (five) minutes as needed for chest pain.,  Disp: , Rfl:    omeprazole (PRILOSEC) 20 MG capsule, Take 20 mg by mouth daily., Disp: , Rfl:    rosuvastatin (CRESTOR) 5 MG tablet, Take 5 mg by mouth daily., Disp: , Rfl:    traMADol (ULTRAM)  50 MG tablet, Take 1-2 tablets (50-100 mg total) by mouth every 6 (six) hours as needed for moderate pain., Disp: 40 tablet, Rfl: 0   Vitamin D, Ergocalciferol, (DRISDOL) 1.25 MG (50000 UNIT) CAPS capsule, Take 50,000 Units by mouth every Sunday., Disp: , Rfl:   Clinical Interview:   The following information was obtained during a clinical interview with Ms. Hagemann and her daughter prior to cognitive testing.  Cognitive Symptoms: Decreased short-term memory: Denied. Ms. Tomer did acknowledge "maybe a little" difficulty recalling conversations but emphasized that these were not out of the ordinary. When asked directly, she did report that family members will comment to her that she already made certain statements. Her daughter emphasized her mother repeating herself or asking repetitive questions. Per her daughter, difficulties were said to be present for the past year and seemed to have gradually worsened over time.  Decreased long-term memory: Denied. Decreased attention/concentration: Denied. Reduced processing speed: Endorsed "sometimes." This appeared more noticeable when tired or irritated.  Difficulties with executive functions: Denied. Ms. Vanduyn denied trouble with impulsivity or any significant personality changes. Her daughter did state that her mother has seemed more easily agitated and irritable over the past year or so, different from her baseline.  Difficulties with emotion regulation: Denied. Difficulties with receptive language: Denied. Difficulties with word finding: Endorsed. Decreased visuoperceptual ability: Denied.  Difficulties completing ADLs: Largely denied. Ms. Landry lives alone and is independent with all ADLs. Her daughter noted that Ms. Moyers may not take all her medications exactly as described; however, based upon Ms. Younkin's explanation, this may be a personal choice rather than forgetting to take second doses. Her daughter reminded her that she double paid a bill a few  months prior but they denied this being a frequent occurrence. Her daughter described a single instance where Ms. Haese accidentally turned onto an Materials engineer. Ms. Lauderbaugh stated that this was due to her being unfamiliar with the area and these ramps being constructed different here relative to [place] where she recently moved from.   Additional Medical History: History of traumatic brain injury/concussion: Denied. History of stroke: Denied. History of seizure activity: Denied. History of known exposure to toxins: Denied. Symptoms of chronic pain: Endorsed. Pain symptoms were localized to her knees. She underwent a right knee replacement this past February and noted that her rehabilitation was progressing well.  Experience of frequent headaches/migraines: Denied. Frequent instances of dizziness/vertigo: Denied.  Sensory changes: She wears glasses with benefit. Other sensory changes/difficulties (e.g., hearing, taste, smell) were denied.  Balance/coordination difficulties: Endorsed. Generally mild instability was attributed to longstanding knee pain and her recent knee replacement procedure. Her daughter stated that her mother fell once last fall. Ms. Fix described this occurring as she was balancing on a crate while attempting to fix and plug back in her washing machine. After leaning over, she misjudged her distance when coming back down and slipped off the crate, causing her fall.  Other motor difficulties: Denied.  Sleep History: Estimated hours obtained each night: 5-6 hours.  Difficulties falling asleep: Denied. Difficulties staying asleep: Endorsed. Feels rested and refreshed upon awakening: Variably so depending on the quality and quantity of sleep she obtained the night before.   History of snoring: Endorsed. History  of waking up gasping for air: Endorsed. Witnessed breath cessation while asleep: Endorsed. She acknowledged a prior diagnosis of obstructive sleep apnea. She  reported regular attempts to use her CPAP machine. However, her overall adherence seems diminished due to her feeling like she is choking while asleep. Her daughter stated that her pulmonologist previously theorized that Ms. Roderick was unfamiliar with or not used to the device due to limited adherence.   History of vivid dreaming: Denied. Excessive movement while asleep: Denied. Instances of acting out her dreams: Denied.  Psychiatric/Behavioral Health History: Depression: Currently, Ms. Yackley described many acute stressors but stated "I don't think I'm depressed." Her daughter stated that she carries a prior diagnosis of major depressive disorder and has been prescribed medication to treat these symptoms in the past. This medication appears to be utilized variably, with Ms. Harmening not always taking her second dose later in the day due to her questioning its necessity. She has not yet established care with a counselor/therapist; however, she did express her openness to this type of treatment. They highlighted that a counselor may be available via her PCP's office. Current or remote suicidal ideation, intent, or plan was denied.  Anxiety: Denied. Mania: Denied. Trauma History: Denied. Visual/auditory hallucinations: Denied. Delusional thoughts: Denied.  Tobacco: Denied. She reported recently stopping cigarette use.  Alcohol: She reported very rare alcohol consumption and denied a history of problematic alcohol abuse or dependence.  Recreational drugs: Endorsed. She reported minimal marijuana use, describing her use as a puff of a joint here and there and that a single joint may last her several months.   Family History: Problem Relation Age of Onset   Memory loss Mother    Dementia Father    Asthma Sister    Immunodeficiency Daughter    Asthma Daughter    Anxiety disorder Daughter    Depression Daughter    Depression Daughter    Asthma Daughter    Bipolar disorder Daughter    Immunodeficiency  Grandson    Asthma Grandson    This information was confirmed by Ms. Mariea Clonts.  Academic/Vocational History: Highest level of educational attainment: 12 years. She graduated from high school and described herself as an average (largely B/C) student in academic settings. Math was noted as a relative weakness.  History of developmental delay: Denied. History of grade repetition: Denied. Enrollment in special education courses: Denied. History of LD/ADHD: Denied.  Employment: Retired. 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   Evaluation Results:   Behavioral Observations: Ms. Loyer was accompanied by her daughter, arrived to her appointment on time, and was appropriately dressed and groomed. She appeared alert and oriented. Observed gait and station were within normal limits. Gross motor functioning appeared intact upon informal observation and no abnormal movements (e.g., tremors) were noted. Her affect ranged throughout the interview given the subject being discussed. There were times where she appeared defensive and became upset towards her daughter when she would provide contradicting information. This briefly escalated to the point where Ms. Younge became tearful and expressed a desire to discontinue the interview and return home. Her daughter assisted in providing her comfort and reassurance. Her affect also improved after extra steps were taken to explain all the possible outcomes of the evaluation and how it can be beneficial for her ongoing care. She stated either not being informed of the purpose prior to her appointment and that it caught her off guard or that she forgot what  had been explained in the past. Spontaneous speech was fluent and word finding difficulties were not observed during the clinical interview. Thought processes were coherent, organized, and normal in content. Insight into her cognitive difficulties appeared adequate.   During  testing, Ms. Morea was noted to be tearful at times. She made several comments throughout surrounding some recognition of ongoing difficulty or poorer performances. While her affect was somewhat flat and defensive at the start of testing, she was noted to warm up considerably by the end of the testing session. Sustained attention was appropriate. Task engagement was adequate and she persisted when challenged. Overall, Ms. Onorato was cooperative with the clinical interview and subsequent testing procedures.   Adequacy of Effort: The validity of neuropsychological testing is limited by the extent to which the individual being tested may be assumed to have exerted adequate effort during testing. Ms. Brodman expressed her intention to perform to the best of her abilities and exhibited adequate task engagement and persistence. Scores across stand-alone and embedded performance validity measures were within expectation. As such, the results of the current evaluation are believed to be a valid representation of Ms. Hardcastle's current cognitive functioning.  Test Results: Ms. Bencomo was largely oriented at the time of the current evaluation. She incorrectly stated the house number of her address ("[id number]" instead of [id number]) and was one day off when stating the current date. When asked the name of the current clinic, she responded "counsel[facility]."  Intellectual abilities based upon educational and vocational attainment were estimated to be in the below average to average range. Premorbid abilities were estimated to be within the well below average range based upon a single-word reading test.   Processing speed was below average to average. Basic attention was average to well above average. More complex attention (e.g., working memory) was below average. Executive functioning was variable, ranging from the well below average to above average normative ranges. She performed in the average range across a task assessing safety and  judgment.   Assessed receptive language abilities were average. Likewise, Ms. Janeway did not exhibit any difficulties comprehending task instructions and answered all questions asked of her appropriately. Assessed expressive language (e.g., verbal fluency and confrontation naming) was below average to average.     Assessed visuospatial/visuoconstructional abilities were below average to average.    Learning (i.e., encoding) of novel verbal information was well below average to below average. Spontaneous delayed recall (i.e., retrieval) of previously learned information was below average to average. Retention rates were 75% across a story learning task, 86% across a list learning task, and 65% across a figure drawing task. Performance across recognition tasks was below average to average, suggesting evidence for information consolidation.   Results of emotional screening instruments suggested that recent symptoms of generalized anxiety were in the moderate range, while symptoms of depression were within the mild range. A screening instrument assessing recent sleep quality suggested the presence of mild sleep dysfunction.  Tables of Scores:   Note: This summary of test scores accompanies the interpretive report and should not be considered in isolation without reference to the appropriate sections in the text. Descriptors are based on appropriate normative data and may be adjusted based on clinical judgment. Terms such as "Within Normal Limits" and "Outside Normal Limits" are used when a more specific description of the test score cannot be determined.       Percentile - Normative Descriptor > 98 - Exceptionally High 91-97 - Well Above Average 75-90 - Above  Average 25-74 - Average 9-24 - Below Average 2-8 - Well Below Average < 2 - Exceptionally Low       Validity:   DESCRIPTOR       ACS Word Choice: --- --- Within Normal Limits  Dot Counting Test: --- --- Within Normal Limits  RBANS Effort  Index: --- --- Within Normal Limits  WAIS-IV Reliable Digit Span: --- --- Within Normal Limits       Orientation:      Raw Score Percentile   NAB Orientation, Form 1 26/29 --- ---       Cognitive Screening:      Raw Score Percentile   SLUMS: 24/30 --- ---       RBANS, Form A: Standard Score/ Scaled Score Percentile   Total Score 87 19 Below Average  Immediate Memory 73 4 Well Below Average    List Learning 4 2 Well Below Average    Story Memory 6 9 Below Average  Visuospatial/Constructional 89 23 Below Average    Figure Copy 8 25 Average    Line Orientation 16/20 26-50 Average  Language 92 30 Average    Picture Naming 10/10 51-75 Average    Semantic Fluency 7 16 Below Average  Attention 103 58 Average    Digit Span 14 91 Well Above Average    Coding 7 16 Below Average  Delayed Memory 98 45 Average    List Recall 6/10 51-75 Average    List Recognition 19/20 26-50 Average    Story Recall 7 16 Below Average    Story Recognition 9/12 16-26 Below Average    Figure Recall 8 25 Average    Figure Recognition 7/8 53-69 Average        Intellectual Functioning:      Standard Score Percentile   Test of Premorbid Functioning: 73 4 Well Below Average       Attention/Executive Function:     Trail Making Test (TMT): Raw Score (T Score) Percentile     Part A 31 secs.,  2 errors (56) 73 Average    Part B 78 secs.,  2 errors (58) 79 Above Average         Scaled Score Percentile   WAIS-IV Digit Span: 8 25 Average    Forward 10 50 Average    Backward 7 16 Below Average    Sequencing 7 16 Below Average        Scaled Score Percentile   WAIS-IV Similarities: 5 5 Well Below Average       D-KEFS Color-Word Interference Test: Raw Score (Scaled Score) Percentile     Color Naming 35 secs. (9) 37 Average    Word Reading 30 secs. (7) 16 Below Average    Inhibition 70 secs. (10) 50 Average      Total Errors 4 errors (8) 25 Average    Inhibition/Switching 97 secs. (7) 16 Below Average       Total Errors 7 errors (6) 9 Below Average       D-KEFS Verbal Fluency Test: Raw Score (Scaled Score) Percentile     Letter Total Correct 22 (6) 9 Below Average    Category Total Correct 33 (9) 37 Average    Category Switching Total Correct 13 (11) 63 Average    Category Switching Accuracy 10 (9) 37 Average      Total Set Loss Errors 0 (13) 84 Above Average      Total Repetition Errors 4 (9) 37 Average       NAB  Advertising account executive, Form 1: T Score Percentile     Judgment 48 42 Average       Language:     Verbal Fluency Test: Raw Score (T Score) Percentile     Phonemic Fluency (FAS) 22 (42) 21 Below Average    Animal Fluency 15 (51) 54 Average        NAB Language Module, Form 1: T Score Percentile     Auditory Comprehension 43 25 Average    Naming 28/31 (39) 14 Below Average       Visuospatial/Visuoconstruction:      Raw Score Percentile   Clock Drawing: 9/10 --- Within Normal Limits        Scaled Score Percentile   WAIS-IV Block Design: 8 25 Average  WAIS-IV Matrix Reasoning: 6 9 Below Average       Mood and Personality:      Raw Score Percentile   Beck Depression Inventory - II: 19 --- Mild  PROMIS Anxiety Questionnaire: 22 --- Moderate       Additional Questionnaires:      Raw Score Percentile   PROMIS Sleep Disturbance Questionnaire: 26 --- Mild   Informed Consent and Coding/Compliance:   The current evaluation represents a clinical evaluation for the purposes previously outlined by the referral source and is in no way reflective of a forensic evaluation.   Ms. Monger was provided with a verbal description of the nature and purpose of the present neuropsychological evaluation. Also reviewed were the foreseeable risks and/or discomforts and benefits of the procedure, limits of confidentiality, and mandatory reporting requirements of this provider. The patient was given the opportunity to ask questions and receive answers about the evaluation. Oral consent to  participate was provided by the patient.   This evaluation was conducted by Christia Reading, Ph.D., ABPP-CN, board certified clinical neuropsychologist. Ms. Stierwalt completed a clinical interview with Dr. Melvyn Novas, billed as one unit [phone], and 130 minutes of cognitive testing and scoring, billed as one unit [phone] and three additional units [id number]. Psychometrist Cruzita Lederer, B.S., assisted Dr. Melvyn Novas with test administration and scoring procedures. As a separate and discrete service, Dr. Melvyn Novas spent a total of 160 minutes in interpretation and report writing billed as one unit [phone] and two units [id number].

## 2022-02-08 ENCOUNTER — Ambulatory Visit (INDEPENDENT_AMBULATORY_CARE_PROVIDER_SITE_OTHER): Payer: Medicare HMO | Admitting: Psychology

## 2022-02-08 DIAGNOSIS — G4733 Obstructive sleep apnea (adult) (pediatric): Secondary | ICD-10-CM | POA: Diagnosis not present

## 2022-02-08 DIAGNOSIS — I6781 Acute cerebrovascular insufficiency: Secondary | ICD-10-CM | POA: Diagnosis not present

## 2022-02-08 DIAGNOSIS — F330 Major depressive disorder, recurrent, mild: Secondary | ICD-10-CM | POA: Diagnosis not present

## 2022-02-08 DIAGNOSIS — R4189 Other symptoms and signs involving cognitive functions and awareness: Secondary | ICD-10-CM

## 2022-02-08 NOTE — Progress Notes (Signed)
   Neuropsychology Feedback Session [name]. [facility]  Reason for Referral:   Samantha Luna is a 61 y.o. right-handed African-American female referred by  Sharene Butters, PA-C , to characterize her current cognitive functioning and assist with diagnostic clarity and treatment planning in the context of subjective cognitive decline.   Feedback:   [name] completed a comprehensive neuropsychological evaluation on 01/24/2022. Please refer to that encounter for the full report and recommendations. Briefly, results suggested neuropsychological functioning largely within normal limits relative to age-matched peers. A primary weakness was exhibited encoding (i.e., learning) novel verbal information. Additional performance variability was exhibited across executive functioning. Regarding etiology, there could be a vascular contribution to ongoing dysfunction given that her recent brain MRI revealed mild to moderate microvascular ischemic changes. Her pattern of deficits would align well with this underlying etiology. Additionally, across mood-related questionnaires, [name] described mild to moderate psychiatric distress (i.e., anxiety and depression). She also reported mild sleep dysfunction and ongoing chronic pain. These variables, especially when combined together, can create and/or worsen cognitive inefficiencies. Weaknesses across testing are certainly reasonable findings given the presence of these variables and it may be that a combination of vascular and psychiatric factors is the best explanation for ongoing day-to-day dysfunction.   [name] was accompanied by her daughter during the current feedback session. Content of the current session focused on the results of her neuropsychological evaluation. [name] was given the opportunity to ask questions and her questions were answered. She was encouraged to reach out should additional questions arise. A copy  of her report was provided at the conclusion of the visit.      30 minutes were spent conducting the current feedback session with [name], billed as one unit [phone].

## 2022-02-09 ENCOUNTER — Ambulatory Visit (INDEPENDENT_AMBULATORY_CARE_PROVIDER_SITE_OTHER): Payer: Medicare HMO | Admitting: Pulmonary Disease

## 2022-02-09 ENCOUNTER — Encounter: Payer: Self-pay | Admitting: Pulmonary Disease

## 2022-02-09 VITALS — BP 124/76 | HR 93

## 2022-02-09 DIAGNOSIS — Z9989 Dependence on other enabling machines and devices: Secondary | ICD-10-CM

## 2022-02-09 DIAGNOSIS — G4733 Obstructive sleep apnea (adult) (pediatric): Secondary | ICD-10-CM | POA: Diagnosis not present

## 2022-02-09 NOTE — Patient Instructions (Signed)
Continue using your CPAP nightly  We adjusted the humidification today to 4 from 8 You can readjust it if needed  You may try to use the machine without adding water to it  Continue inhalers  Continue weight loss efforts  Call with significant concerns

## 2022-02-09 NOTE — Progress Notes (Signed)
Samantha Luna    [id number]    20-Feb-1961  Primary Care Physician:Roberson, Carmell Austria, MD  Referring Physician: Wendie Simmer, MD [address] [place],   [id number]  Chief complaint:   Patient with a history of obstructive sleep apnea Has tried to get back to using CPAP on a nightly basis  HPI:  Still having issues with mask rainout  Did receive new CPAP supplies Has been trying to use CPAP  Has managed to lose some weight since last visit  Has been exercising regularly, swimming regularly Breathing has been relatively stable  She does feel better when she is able to keep the CPAP on but having difficulty with tolerating it with the rainout  I did review our machine with today and we did reduce the humidification setting on the machine from 8 down to 4, she was encouraged to turn it down even further if she still experiencing significant rainout or to use the machine without humidification.  She had a recent sleep study at home sleep study on 12/23/2021 with an AHI of 6  Post knee surgery which she tolerated well  she currently she recollects that the study showed that she stop breathing about 11 times an hour -Recent study shows AHI of 6  [place] Used CPAP regularly and then got away from using it Usually goes to bed about 7 PM, final wake up time about 4 AM, she does have about 1 or 2 awakenings Denies any headaches in the morning She does have dryness of her mouth in the mornings No family history of sleep apnea known to her She does have a history of hypertension, has had a negative stress test recently, does have a history of angina  PFT from 2020 did reveal mild obstructive disease, mild restriction  Knee feels a lot better since surgery, tolerating rehab  Outpatient Encounter Medications as of 02/09/2022  Medication Sig   acetaminophen (TYLENOL) 325 MG tablet Take 325-650 mg by mouth every 6 (six) hours as  needed for moderate pain.   albuterol (VENTOLIN HFA) 108 (90 Base) MCG/ACT inhaler Inhale 1-2 puffs into the lungs every 6 (six) hours as needed for wheezing or shortness of breath.   buPROPion (WELLBUTRIN SR) 150 MG 12 hr tablet Take 150 mg by mouth 2 (two) times daily.   fluticasone (FLOVENT HFA) 220 MCG/ACT inhaler Inhale 2 puffs into the lungs 2 (two) times daily.   gabapentin (NEURONTIN) 300 MG capsule Take a 300 mg capsule three times a day for two weeks following surgery.Then take a 300 mg capsule two times a day for two weeks. Then take a 300 mg capsule once a day for two weeks. Then discontinue.   ibandronate (BONIVA) 150 MG tablet Take 150 mg by mouth every 30 (thirty) days.   lidocaine (LIDODERM) 5 % Place 1 patch onto the skin daily as needed (pain). Remove & Discard patch within 12 hours or as directed by MD   loratadine (CLARITIN) 10 MG tablet Take 10 mg by mouth daily as needed for allergies.   losartan-hydrochlorothiazide (HYZAAR) 100-25 MG tablet Take 1 tablet by mouth daily.   methocarbamol (ROBAXIN) 500 MG tablet Take 1 tablet (500 mg total) by mouth every 6 (six) hours as needed for muscle spasms.   Multiple Vitamins-Minerals (MULTIVITAMIN WITH MINERALS) tablet Take 1 tablet by mouth daily.   nitroGLYCERIN (NITROSTAT) 0.4 MG SL tablet Place 0.4 mg under the  tongue every 5 (five) minutes as needed for chest pain.   omeprazole (PRILOSEC) 20 MG capsule Take 20 mg by mouth daily.   rosuvastatin (CRESTOR) 5 MG tablet Take 5 mg by mouth daily.   traMADol (ULTRAM) 50 MG tablet Take 1-2 tablets (50-100 mg total) by mouth every 6 (six) hours as needed for moderate pain.   Vitamin D, Ergocalciferol, (DRISDOL) 1.25 MG (50000 UNIT) CAPS capsule Take 50,000 Units by mouth every Sunday.   No facility-administered encounter medications on file as of 02/09/2022.    Allergies as of 02/09/2022 - Review Complete 11/08/2021  Allergen Reaction Noted   Iodine Other (See Comments) 05/16/2021    Codeine Itching 05/16/2021    Past Medical History:  Diagnosis Date   Acute exacerbation of chronic obstructive airways disease 11/09/2017   Hospitalization 11/09/17 in [place]   Allergic contact dermatitis 09/12/2021   Asthma    Atherosclerosis of native arteries of extremities with intermittent claudication, right leg 08/11/2019   Bell's palsy    Chest pain 75/64/3329   Complication of anesthesia    hysterectomy - slow to wake up   Congestive heart failure 04/26/2010   Echo 11/13/20 in [place] showed normal ejection fraction, normal echo.    COPD (chronic obstructive pulmonary disease) 02/11/2016   Edema of both legs 04/26/2010   GERD (gastroesophageal reflux disease)    Headache    High cholesterol 02/11/2016   History of COVID-19 07/20/2020   Laryngopharyngeal reflux 08/14/2018   Major depressive disorder    Meningioma 04/23/2010   01/28/13 Head CT: A left posterior parafalcine meningioma is seen that measures 1.5 cm,  previously measuring 1.3 cm. Basal cisterns and sulci are unremarkable. Impression:  Left posterior parafalcine meningioma is mildly increased in size since  prior without significant surrounding mass effect or acute process visualized.   Acute or subacute ischemia is not excluded with CT.   Mixed hyperlipidemia 08/25/2021   Obstructive sleep apnea 08/18/2021   Osteoarthritis of right knee 11/10/2020   Palpitations 01/06/2019   Peripheral vascular disease    Pneumonia    Pre-diabetes    Primary hypertension 08/25/2021   Primary osteoarthritis of left knee 10/03/2021   Restrictive lung disease 03/05/2018    Past Surgical History:  Procedure Laterality Date   ABDOMINAL HYSTERECTOMY     APPENDECTOMY     meniscus repair on right knee      TONSILLECTOMY     TOTAL KNEE ARTHROPLASTY Left 10/03/2021   Procedure: TOTAL KNEE ARTHROPLASTY;  Surgeon: Gaynelle Arabian, MD;  Location: [facility];  Service: Orthopedics;  Laterality: Left;    Family  History  Problem Relation Age of Onset   Memory loss Mother    Dementia Father    Asthma Sister    Immunodeficiency Daughter    Asthma Daughter    Anxiety disorder Daughter    Depression Daughter    Depression Daughter    Asthma Daughter    Bipolar disorder Daughter    Immunodeficiency Grandson    Asthma Grandson     Social History   Socioeconomic History   Marital status: Single    Spouse name: Not on file   Number of children: Not on file   Years of education: 12   Highest education level: High school graduate  Occupational History   Occupation: Retired    Comment: Recruitment consultant  Tobacco Use   Smoking status: Former    Packs/day: 1.00    Years: 40.00    Total  pack years: 40.00    Types: Cigarettes    Quit date: 08/28/2021    Years since quitting: 0.4   Smokeless tobacco: Never  Vaping Use   Vaping Use: Never used  Substance and Sexual Activity   Alcohol use: Yes    Comment: Very infrequent   Drug use: Yes    Types: Marijuana    Comment: Pt reports that a joint will last her several months   Sexual activity: Not Currently  Other Topics Concern   Not on file  Social History Narrative   Not on file   Social Determinants of Health   Financial Resource Strain: Not on file  Food Insecurity: Not on file  Transportation Needs: Not on file  Physical Activity: Not on file  Stress: Not on file  Social Connections: Not on file  Intimate Partner Violence: Not on file    Review of Systems  Constitutional:  Negative for fatigue and fever.  Respiratory:  Positive for shortness of breath.   Psychiatric/Behavioral:  Positive for sleep disturbance.     There were no vitals filed for this visit.    Physical Exam Constitutional:      Appearance: She is obese.  HENT:     Head: Normocephalic.     Right Ear: Tympanic membrane normal.     Nose: No congestion.     Mouth/Throat:     Mouth: Mucous membranes are moist.     Comments: Mallampati 4, crowded oropharynx,  macroglossia Eyes:     Pupils: Pupils are equal, round, and reactive to light.  Cardiovascular:     Rate and Rhythm: Normal rate and regular rhythm.     Heart sounds: No murmur heard.    No friction rub.  Pulmonary:     Effort: No respiratory distress.     Breath sounds: No stridor. No wheezing or rhonchi.  Musculoskeletal:     Cervical back: No rigidity or tenderness.  Neurological:     Mental Status: She is alert.  Psychiatric:        Mood and Affect: Mood normal.      09/09/2021    9:00 AM  Results of the Epworth flowsheet  Sitting and reading 3  Watching TV 3  Sitting, inactive in a public place (e.g. a theatre or a meeting) 2  As a passenger in a car for an hour without a break 2  Lying down to rest in the afternoon when circumstances permit 3  Sitting and talking to someone 2  Sitting quietly after a lunch without alcohol 2  In a car, while stopped for a few minutes in traffic 0  Total score 17   Data Reviewed:  Compliance data reveals 47% compliance Machine set at 6 95 percentile pressure of 5.5 AHI of 2.4  Most recent compliance data from 11/11/2021 to 02/08/2022 shows 33% compliance Set pressure of 6 AHI of 2.4  A previous PFT from 2020 was brought in by her daughter showing mild obstructive disease, mild restrictive disease-reviewed  Assessment:   .  History of obstructive sleep apnea -Compliance remains poor -Still having issues with the humidification setting which we did look at during the office visit today  -Humidification setting readjusted to 4 from 6  Patient will try and readjust to using CPAP  We did talk about an inspire device and she will not be a candidate for an inspire device as AHI is less than 15  Has not had any significant issues with asthma recently -She  is on Flovent and albuterol as needed  She does have mild sleep apnea -Encouraged to get back to using CPAP on a regular basis  Option for an oral device as an option of  treatment for sleep apnea discussed  Plan/Recommendations:  .  I did encourage her to continue using CPAP on a regular nightly basis  .  Continue bronchodilators  .  Importance of continuing to use CPAP on a regular basis was discussed  .  I will see her back in about 3 months  Encouraged to call with significant concerns .  Encouraged to continue with weight loss efforts   .  I spent 30 minutes dedicated to the care of this patient on the date of this encounter to include previsit review of records, face-to-face time with the patient discussing conditions above, post visit ordering of testing, clinical documentation with electronic health record, making appropriate referrals as documented, and communicated necessary findings to members of the patient's care team  Sherrilyn Rist MD [facility] 02/09/2022, 9:12 AM  CC: Wendie Simmer, MD  Will schedule for lung cancer screening CT

## 2022-02-14 ENCOUNTER — Telehealth: Payer: Self-pay

## 2022-02-14 NOTE — Telephone Encounter (Signed)
   Pre-operative Risk Assessment    Patient Name: Samantha Luna  DOB: 1961/06/29 MRN: [id number]      Request for Surgical Clearance    Procedure:   Right Total Knee Arthroplasty  Date of Surgery:  Clearance 05/22/22                                 Surgeon: Gaynelle Arabian, MD Surgeon's Group or Practice Name:  [facility] Phone number:  [phone] Fax number:  [phone]   Type of Clearance Requested:   - Medical    Type of Anesthesia:   Choice   Additional requests/questions:    SignedWonda Horner   02/14/2022, 8:29 AM

## 2022-02-14 NOTE — Telephone Encounter (Signed)
    Name: Samantha Luna  DOB: [birth year]  MRN: [id number]  Primary Cardiologist: Berniece Salines, DO   Preoperative team, please contact this patient and set up a phone call appointment for further preoperative risk assessment. Please obtain consent and complete medication review. Thank you for your help.  I confirm that guidance regarding antiplatelet and oral anticoagulation therapy has been completed and, if necessary, noted below.    Ledora Bottcher, PA 02/14/2022, 10:42 AM [place] [address] [place], [place] [address]

## 2022-02-15 NOTE — Telephone Encounter (Signed)
1st attempt to reach pt regarding surgical clearance.  Left pt a message to call back and ask for pre-op team.

## 2022-02-20 ENCOUNTER — Telehealth: Payer: Self-pay | Admitting: *Deleted

## 2022-02-21 ENCOUNTER — Encounter: Payer: Self-pay | Admitting: Physician Assistant

## 2022-02-21 ENCOUNTER — Ambulatory Visit (INDEPENDENT_AMBULATORY_CARE_PROVIDER_SITE_OTHER): Payer: Medicare HMO | Admitting: Physician Assistant

## 2022-02-21 VITALS — BP 129/72 | HR 103 | Resp 20 | Ht 62.0 in | Wt 206.0 lb

## 2022-02-21 DIAGNOSIS — G3184 Mild cognitive impairment, so stated: Secondary | ICD-10-CM | POA: Diagnosis not present

## 2022-02-21 NOTE — Progress Notes (Signed)
Assessment/Plan:   Memory difficulties    Samantha Luna is a 61 year old RH woman with a history of CHF, HLD,  OA, OSA, asthma, tobacco use, COPD, h/o meningioma (2011) anxiety, depression and hypertension, chronic pain medications due to arthritis, seen today for evaluation of memory loss after her neuropsychological exam. This yielded results within normal limits relative to age-matched peers.  Primary weakness exhibited in encoding (learning) new verbal information.  Last MoCA in December 2022 was 23/30.  MRI of the brain remarkable for chronic small vessel ischemia greatest in the pons, and a known 13 mm dural based mass in the left occipital pole consistent with a history of meningioma, without significant growth.  She is not on antidementia medications. Recommendations:   Repeat neuropsychological test in 24-36 months  Recommend CBT Recommend CPAP use on a nightly basis  Follow up in 1 year   Case discussed with Dr. Delice Luna who agrees with the plan     Subjective:     This patient is here alone previous records as well as any outside records available were reviewed prior to todays visit.  Patient was last seen at our office on 08/10/2021 at which time her MoCA was 23/30.  She is not on a dementia medication  Any changes in memory since last visit?  Patient reports that her memory feels better than prior.  She is more active, she has "decided to take matters into my own hands, and become more active, losing weight, and I think that helped ".   Patient lives with: She lives alone.  She is getting used to her new environment here in [place], after having moved from [place] last Christmas. Repeats oneself?  She denies Disoriented when walking into a room?  Patient denies   Leaving objects in unusual places?  Patient denies   Ambulates  with difficulty?   Patient denies   Recent falls?  Patient denies   Any head injuries?  Patient denies   History of seizures?   Patient  denies   Wandering behavior?  Patient denies   Patient drives?  Patient drives without difficulty. Any mood changes such irritability agitation?  Patient denies   Any history of depression?:  Patient denies.  Initially, she was more depressed, especially when she moved here to this house, believing that she was going to be seeing her daughter more often, but apparently, there were some issues with her, and she does not get to see her or the grandchild as much as she would want.  Since then, she decided to "leave for myself, and enjoy my life ".  [place] just [place]. Hallucinations?  Patient denies   Paranoia?  Patient denies   Patient reports that he sleeps well without vivid dreams, REM behavior or sleepwalking   History of sleep apnea?  Patient denies   Any hygiene concerns?  Patient denies   Independent of bathing and dressing?  Endorsed  Does the patient needs help with medications?  She is in charge of her medications Who is in charge of the finances?  Patient is in charge    Any changes in appetite?  Patient is making a conscious effort to lose weight, and eat healthy. Patient have trouble swallowing? Patient denies   Does the patient cook?  Patient denies   Any kitchen accidents such as leaving the stove on? Patient denies   Any headaches?  Patient denies   The double vision? Patient  denies   Any focal numbness or tingling?  Patient denies   Chronic back pain Patient denies   Unilateral weakness?  Patient denies   Any tremors?  Patient denies   Any history of anosmia?  Patient denies   Any incontinence of urine?  Patient denies   Any bowel dysfunction?   Patient denies  Neuropsych evaluation 01/2022 Dr. Melvyn Luna:  Briefly, results suggested neuropsychological functioning largely within normal limits relative to age-matched peers. A primary weakness was exhibited encoding (i.e., learning) novel verbal information. Additional performance variability was exhibited  across executive functioning. Regarding etiology, there could be a vascular contribution to ongoing dysfunction given that her recent brain MRI revealed mild to moderate microvascular ischemic changes. Her pattern of deficits would align well with this underlying etiology. Additionally, across mood-related questionnaires, Samantha Luna described mild to moderate psychiatric distress (i.e., anxiety and depression). She also reported mild sleep dysfunction and ongoing chronic pain. These variables, especially when combined together, can create and/or worsen cognitive inefficiencies. Weaknesses across testing are certainly reasonable findings given the presence of these variables and it may be that a combination of vascular and psychiatric factors is the best explanation for ongoing day-to-day dysfunction.    Initial Visit 08/10/21 The patient is seen in neurologic consultation at the request of Samantha Simmer, MD for the evaluation of memory.  The patient is accompanied by her daughter who supplements the history. This is a 60 y.o. year old RH  female who has had memory issues for about 1 year, after the death of her sister.  She noticed repeating the same stories, asking the same questions, and at times not knowing what she came to the room for.  She also reports that sometimes she "cannot come with the right word which causes a lot of frustration ".  She states that she understands the instructions, and does not like when she cannot express what she is thinking.  She denies leaving objects in unusual places.  She continues to drive without getting lost.  Her daughter reports that she suffer significant amount of grief after her closest family members died, including her sister dying in June 2021.  Until then, she had "bottled up all those feelings, which she let go after the sister's death "-daughter reports, NP at a local PCP office.  She is in the process of seeking counseling to help her emotionally.  Here,  she lives alone, and she is very independent with her ADLs.  She does crossword puzzles, likes to read the Bible, place "Bible games ", and also likes to play Candy crash.  She also is doing physical therapy in preparation for her left knee replacement, which keeps her active at least 3 times a week.  After the surgery she is planning to join the [facility] sneakers program.  She does not sleep well, especially when not wearing the CPAP.  She has an appointment in January for further evaluation and to see if she needs adjustment or replacement of the CPAP.  She states that when she does use it, she sleeps well, but for now, she is waking up every 2 hours, thus getting up very tired.  She denies vivid dreams or sleepwalking, or hallucinations.  The patient denies paranoia, but her daughter states that she constantly feels that she is talked about by her family members.  There are no hygiene concerns, she is independent of bathing and dressing.  The medications are in a pillbox, "the problem is that she  may forget the second dose, and the inhaler symptoms are mixed up "-Per daughter report.  Patient is independent of finances, denies missing any bills.  Her appetite is good, denies trouble swallowing.  She cooks without leaving the stove or the faucet on.  She ambulates with some difficulty, due to knee arthritis and has fallen a couple of times due to pain, but denies numbness, or tingling in the extremities.  She denies any unilateral weakness.  She denies any history of stroke, but has significant vascular history in the family of stroke, and aneurysms.  She denies any recent headaches, nausea, vomiting, double vision, she does get dizzy if she does not take her blood pressure medication.  She denies any anosmia.  No tremors are reported, "only if I get a little nervous ".  No history of seizures.  Denies urine incontinence, retention, she has chronic constipation due to pain medicines.  She denies any history  of alcohol.  She has decreased significantly her cigarette consumption, from 1 pack a day to down to 2 cigarettes a day, and she is planning to quit soon.  Family history remarkable for father with Alzheimer's disease, sister who had dementia prior to dying but no formal diagnosis of Alzheimer's.  There is significant vascular history in the family as mentioned above. 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    MRI brain 08/2021 13 mm dural based mass at the left occipital pole consistent with history of meningioma. No brain edema.  Chronic small vessel ischemia greatest in the pons.    Past Medical History:  Diagnosis Date   Acute exacerbation of chronic obstructive airways disease 11/09/2017   Hospitalization 11/09/17 in [place]   Allergic contact dermatitis 09/12/2021   Asthma    Atherosclerosis of native arteries of extremities with intermittent claudication, right leg 08/11/2019   Bell's palsy    Chest pain 09/81/1914   Complication of anesthesia    hysterectomy - slow to wake up   Congestive heart failure 04/26/2010   Echo 11/13/20 in [place] showed normal ejection fraction, normal echo.    COPD (chronic obstructive pulmonary disease) 02/11/2016   Edema of both legs 04/26/2010   GERD (gastroesophageal reflux disease)    Headache    High cholesterol 02/11/2016   History of COVID-19 07/20/2020   Laryngopharyngeal reflux 08/14/2018   Major depressive disorder    Meningioma 04/23/2010   01/28/13 Head CT: A left posterior parafalcine meningioma is seen that measures 1.5 cm,  previously measuring 1.3 cm. Basal cisterns and sulci are unremarkable. Impression:  Left posterior parafalcine meningioma is mildly increased in size since  prior without significant surrounding mass effect or acute process visualized.   Acute or subacute ischemia is not excluded with CT.   Mixed hyperlipidemia 08/25/2021   Obstructive sleep apnea 08/18/2021   Osteoarthritis of right knee 11/10/2020    Palpitations 01/06/2019   Peripheral vascular disease    Pneumonia    Pre-diabetes    Primary hypertension 08/25/2021   Primary osteoarthritis of left knee 10/03/2021   Restrictive lung disease 03/05/2018     Past Surgical History:  Procedure Laterality Date   ABDOMINAL HYSTERECTOMY     APPENDECTOMY     meniscus repair on right knee      TONSILLECTOMY     TOTAL KNEE ARTHROPLASTY Left 10/03/2021   Procedure: TOTAL KNEE ARTHROPLASTY;  Surgeon: Gaynelle Arabian, MD;  Location: [facility];  Service: Orthopedics;  Laterality: Left;     PREVIOUS MEDICATIONS:  CURRENT MEDICATIONS:  Outpatient Encounter Medications as of 02/21/2022  Medication Sig   acetaminophen (TYLENOL) 325 MG tablet Take 325-650 mg by mouth every 6 (six) hours as needed for moderate pain.   albuterol (VENTOLIN HFA) 108 (90 Base) MCG/ACT inhaler Inhale 1-2 puffs into the lungs every 6 (six) hours as needed for wheezing or shortness of breath.   buPROPion (WELLBUTRIN SR) 150 MG 12 hr tablet Take 150 mg by mouth 2 (two) times daily.   fluticasone (FLOVENT HFA) 220 MCG/ACT inhaler Inhale 2 puffs into the lungs 2 (two) times daily.   gabapentin (NEURONTIN) 300 MG capsule Take a 300 mg capsule three times a day for two weeks following surgery.Then take a 300 mg capsule two times a day for two weeks. Then take a 300 mg capsule once a day for two weeks. Then discontinue.   ibandronate (BONIVA) 150 MG tablet Take 150 mg by mouth every 30 (thirty) days.   lidocaine (LIDODERM) 5 % Place 1 patch onto the skin daily as needed (pain). Remove & Discard patch within 12 hours or as directed by MD   loratadine (CLARITIN) 10 MG tablet Take 10 mg by mouth daily as needed for allergies.   losartan-hydrochlorothiazide (HYZAAR) 100-25 MG tablet Take 1 tablet by mouth daily.   methocarbamol (ROBAXIN) 500 MG tablet Take 1 tablet (500 mg total) by mouth every 6 (six) hours as needed for muscle spasms.   Multiple Vitamins-Minerals (MULTIVITAMIN WITH  MINERALS) tablet Take 1 tablet by mouth daily.   MYRBETRIQ 50 MG TB24 tablet Take 50 mg by mouth daily.   nitroGLYCERIN (NITROSTAT) 0.4 MG SL tablet Place 0.4 mg under the tongue every 5 (five) minutes as needed for chest pain.   omeprazole (PRILOSEC) 20 MG capsule Take 20 mg by mouth daily.   rosuvastatin (CRESTOR) 5 MG tablet Take 5 mg by mouth daily.   traMADol (ULTRAM) 50 MG tablet Take 1-2 tablets (50-100 mg total) by mouth every 6 (six) hours as needed for moderate pain.   Vitamin D, Ergocalciferol, (DRISDOL) 1.25 MG (50000 UNIT) CAPS capsule Take 50,000 Units by mouth every 'Sunday.   No facility-administered encounter medications on file as of 02/21/2022.     Objective:     PHYSICAL EXAMINATION:    VITALS:   Vitals:   02/21/22 0910  BP: 129/72  Pulse: (!) 103  Resp: 20  SpO2: 98%  Weight: 206 lb (93.4 kg)  Height: 5\' 2" (1.575 m)    GEN:  The patient appears stated age and is in NAD. HEENT:  Normocephalic, atraumatic.   Neurological examination:  General: NAD, well-groomed, appears stated age. Orientation: The patient is alert. Oriented to person, place and date Cranial nerves: There is good facial symmetry.The speech is fluent and clear. No aphasia or dysarthria. Fund of knowledge is appropriate. Recent memory impaired and remote memory is normal.  Attention and concentration are normal.  Able to name objects and repeat phrases.  Hearing is intact to conversational tone.    Sensation: Sensation is intact to light touch throughout Motor: Strength is at least antigravity x4. Tremors: none  DTR\'s 2/4 in UE/LE      12'$ /14/2022    9:00 AM  Montreal Cognitive Assessment   Visuospatial/ Executive (0/5) 4  Naming (0/3) 2  Attention: Read list of digits (0/2) 2  Attention: Read list of letters (0/1) 0  Attention: Serial 7 subtraction starting at 100 (0/3) 2  Language: Repeat phrase (0/2) 1  Language : Fluency (0/1) 0  Abstraction (0/2) 1  Delayed Recall (0/5) 4   Orientation (0/6) 6  Total 22  Adjusted Score (based on education) 23        No data to display             Movement examination: Tone: There is normal tone in the UE/LE Abnormal movements:  no tremor.  No myoclonus.  No asterixis.   Coordination:  There is no decremation with RAM's. Normal finger to nose  Gait and Station: The patient has no difficulty arising out of a deep-seated chair without the use of the hands. The patient's stride length is good.  Gait is cautious and narrow.   Thank you for allowing [place] the opportunity to participate in the care of this nice patient. Please do not hesitate to contact us for any questions or concerns.   Total time spent on today's visit was 30 minutes dedicated to this patient today, preparing to see patient, examining the patient, ordering tests and/or medications and counseling the patient, documenting clinical information in the EHR or other health record, independently interpreting results and communicating results to the patient/family, discussing treatment and goals, answering patient's questions and coordinating care.  Cc:  Samantha Simmer, MD  Sharene Butters 02/23/2022 10:36 AM   Cc:  Samantha Simmer, MD Sharene Butters, PA-C

## 2022-04-03 ENCOUNTER — Other Ambulatory Visit: Payer: Self-pay | Admitting: Family Medicine

## 2022-04-24 ENCOUNTER — Encounter: Payer: Self-pay | Admitting: Physician Assistant

## 2022-04-24 ENCOUNTER — Ambulatory Visit: Payer: Medicare Other | Attending: Physician Assistant | Admitting: Physician Assistant

## 2022-04-24 DIAGNOSIS — Z01810 Encounter for preprocedural cardiovascular examination: Secondary | ICD-10-CM | POA: Diagnosis not present

## 2022-04-24 NOTE — Progress Notes (Addendum)
Virtual Visit via Telephone Note   Because of Samantha Luna's co-morbid illnesses, she is at least at moderate risk for complications without adequate follow up.  This format is felt to be most appropriate for this patient at this time.  The patient did not have access to video technology/had technical difficulties with video requiring transitioning to audio format only (telephone).  All issues noted in this document were discussed and addressed.  No physical exam could be performed with this format.  Please refer to the patient's chart for her consent to telehealth for [facility].  Evaluation Performed:  Preoperative cardiovascular risk assessment _____________   Date:  04/24/2022   Patient ID:  Samantha Luna, DOB Apr 27, 1961, MRN [id number] Patient Location:  [place], [place] Provider location:   [place], [place]  Primary Care Provider:  Wendie Simmer, MD Primary Cardiologist:  Berniece Salines, DO  Chief Complaint / Patient Profile   61 y.o. y/o female with a h/o  Hx of (HFpEF) heart failure with preserved ejection fraction (per admit in 2011) Echo 03/2010 ([place]): EF 60, mild LVH, diastolic dysfunction CCTA 07/2021: CAC score 0; no CAD Hypertension  Hyperlipidemia COPD Obesity  who is pending right total knee arthroplasty with Dr. Wynelle Link 05/22/2022 and presents today for telephonic preoperative cardiovascular risk assessment.  Past Medical History    Past Medical History:  Diagnosis Date   Acute exacerbation of chronic obstructive airways disease 11/09/2017   Hospitalization 11/09/17 in [place]   Allergic contact dermatitis 09/12/2021   Asthma    Atherosclerosis of native arteries of extremities with intermittent claudication, right leg 08/11/2019   Bell's palsy    Chest pain 67/61/9509   Complication of anesthesia    hysterectomy - slow to wake up   Congestive heart failure 04/26/2010   Echo 11/13/20 in [place] showed normal ejection  fraction, normal echo.    COPD (chronic obstructive pulmonary disease) 02/11/2016   Edema of both legs 04/26/2010   GERD (gastroesophageal reflux disease)    Headache    High cholesterol 02/11/2016   History of COVID-19 07/20/2020   Laryngopharyngeal reflux 08/14/2018   Major depressive disorder    Meningioma 04/23/2010   01/28/13 Head CT: A left posterior parafalcine meningioma is seen that measures 1.5 cm,  previously measuring 1.3 cm. Basal cisterns and sulci are unremarkable. Impression:  Left posterior parafalcine meningioma is mildly increased in size since  prior without significant surrounding mass effect or acute process visualized.   Acute or subacute ischemia is not excluded with CT.   Mixed hyperlipidemia 08/25/2021   Obstructive sleep apnea 08/18/2021   Osteoarthritis of right knee 11/10/2020   Palpitations 01/06/2019   Peripheral vascular disease    Pneumonia    Pre-diabetes    Primary hypertension 08/25/2021   Primary osteoarthritis of left knee 10/03/2021   Restrictive lung disease 03/05/2018   Past Surgical History:  Procedure Laterality Date   ABDOMINAL HYSTERECTOMY     APPENDECTOMY     meniscus repair on right knee      TONSILLECTOMY     TOTAL KNEE ARTHROPLASTY Left 10/03/2021   Procedure: TOTAL KNEE ARTHROPLASTY;  Surgeon: Gaynelle Arabian, MD;  Location: [facility];  Service: Orthopedics;  Laterality: Left;    Allergies  Allergies  Allergen Reactions   Iodine Other (See Comments)    Uncontrollable sneezing    Codeine Itching    History of Present Illness    Samantha Luna is a 61 y.o. female who presents via audio/video conferencing  for a telehealth visit today.  Pt was last seen in cardiology clinic on  08/25/21 by Dr. Harriet Masson.  At that time Samantha Luna was doing well.  The patient is now pending procedure as outlined above. Since her last visit, she has done well. She has chronic shortness of breath from Chronic Obstructive Pulmonary Disease. Her breathing is  improved with  L arm L leg pain No CP, + chronic dyspnea, No   Home Medications    Prior to Admission medications   Medication Sig Start Date End Date Taking? Authorizing Provider  acetaminophen (TYLENOL) 325 MG tablet Take 325-650 mg by mouth every 6 (six) hours as needed for moderate pain.    [provider]  albuterol (VENTOLIN HFA) 108 (90 Base) MCG/ACT inhaler Inhale 1-2 puffs into the lungs every 6 (six) hours as needed for wheezing or shortness of breath.    [provider]  buPROPion (WELLBUTRIN SR) 150 MG 12 hr tablet Take 150 mg by mouth 2 (two) times daily.    [provider]  fluticasone (FLOVENT HFA) 220 MCG/ACT inhaler Inhale 2 puffs into the lungs 2 (two) times daily.    [provider]  gabapentin (NEURONTIN) 300 MG capsule Take a 300 mg capsule three times a day for two weeks following surgery.Then take a 300 mg capsule two times a day for two weeks. Then take a 300 mg capsule once a day for two weeks. Then discontinue. 10/04/21   Edmisten, Kristie L, PA  ibandronate (BONIVA) 150 MG tablet Take 150 mg by mouth every 30 (thirty) days.    [provider]  lidocaine (LIDODERM) 5 % Place 1 patch onto the skin daily as needed (pain). Remove & Discard patch within 12 hours or as directed by MD    [provider]  loratadine (CLARITIN) 10 MG tablet Take 10 mg by mouth daily as needed for allergies.    [provider]  losartan-hydrochlorothiazide (HYZAAR) 100-25 MG tablet Take 1 tablet by mouth daily.    [provider]  methocarbamol (ROBAXIN) 500 MG tablet Take 1 tablet (500 mg total) by mouth every 6 (six) hours as needed for muscle spasms. 10/04/21   Edmisten, Ok Anis, PA  Multiple Vitamins-Minerals (MULTIVITAMIN WITH MINERALS) tablet Take 1 tablet by mouth daily.    [provider]  MYRBETRIQ 50 MG TB24 tablet Take 50 mg by mouth daily. 02/02/22   [provider]  nitroGLYCERIN (NITROSTAT) 0.4 MG  SL tablet Place 0.4 mg under the tongue every 5 (five) minutes as needed for chest pain.    [provider]  omeprazole (PRILOSEC) 20 MG capsule Take 20 mg by mouth daily. 11/05/20   [provider]  rosuvastatin (CRESTOR) 5 MG tablet Take 5 mg by mouth daily.    [provider]  traMADol (ULTRAM) 50 MG tablet Take 1-2 tablets (50-100 mg total) by mouth every 6 (six) hours as needed for moderate pain. 10/04/21   Edmisten, Ok Anis, PA  Vitamin D, Ergocalciferol, (DRISDOL) 1.25 MG (50000 UNIT) CAPS capsule Take 50,000 Units by mouth every Sunday. 03/09/21   [provider]    Physical Exam    Vital Signs:  Samantha Luna does not have vital signs available for review today.   Given telephonic nature of communication, physical exam is limited. AAOx3. NAD. Normal affect.  Speech and respirations are unlabored.  Accessory Clinical Findings    None  Assessment & Plan    1.  Preoperative Cardiovascular Risk Assessment:  Samantha Luna perioperative risk of a major cardiac event is 0.9% according to the Revised Cardiac Risk Index (RCRI).  Therefore, she is at low risk for perioperative complications.   Her functional capacity is good at 4.06 METs according to the Duke Activity Status Index (DASI). Recommendations: According to ACC/AHA guidelines, no further cardiovascular testing needed.  The patient may proceed to surgery at acceptable risk.   Antiplatelet and/or Anticoagulation Recommendations: Aspirin can be held for 7 days prior to her surgery.  Please resume Aspirin post operatively when it is felt to be safe from a bleeding standpoint.     A copy of this note will be routed to requesting surgeon.  Time:   Today, I have spent 10 minutes with the patient with telehealth technology discussing medical history, symptoms, and management plan.     Richardson Dopp, PA-C  04/24/2022, 1:17 PM

## 2022-04-28 ENCOUNTER — Encounter (HOSPITAL_COMMUNITY): Payer: Self-pay

## 2022-04-28 NOTE — Patient Instructions (Addendum)
DUE TO COVID-19 ONLY TWO VISITORS  (aged 63 and older)  ARE ALLOWED TO COME WITH YOU AND STAY IN THE WAITING ROOM ONLY DURING PRE OP AND PROCEDURE.   **NO VISITORS ARE ALLOWED IN THE SHORT STAY AREA OR RECOVERY ROOM!!**  IF YOU WILL BE ADMITTED INTO THE HOSPITAL YOU ARE ALLOWED ONLY FOUR SUPPORT PEOPLE DURING VISITATION HOURS ONLY (7 AM -8PM)   The support person(s) must pass our screening, gel in and out, and wear a mask at all times, including in the patient's room. Patients must also wear a mask when staff or their support person are in the room. Visitors GUEST BADGE MUST BE WORN VISIBLY  One adult visitor may remain with you overnight and MUST be in the room by 8 P.M.     Your procedure is scheduled on: 05/22/22   Report to [facility] Main Entrance    Report to admitting at 8:00 AM   Call this number if you have problems the morning of surgery [phone]   Do not eat food :After Midnight.   After Midnight you may have the following liquids until _5:00_____ AM/  DAY OF SURGERY  Water Black Coffee (sugar ok, NO MILK/CREAM OR CREAMERS)  Tea (sugar ok, NO MILK/CREAM OR CREAMERS) regular and decaf                             Plain Jell-O (NO RED)                                           Fruit ices (not with fruit pulp, NO RED)                                     Popsicles (NO RED)                                                                  Juice: apple, WHITE grape, WHITE cranberry Sports drinks like Gatorade (NO RED)                   The day of surgery:  Drink ONE  G2 at 4:45 AM the morning of surgery. Drink in one sitting. Do not sip.  This drink was given to you during your hospital  pre-op appointment visit. Nothing else to drink after completing the  G2. At 5:00 AM          If you have questions, please contact your surgeon's office.       Oral Hygiene is also important to reduce your risk of infection.                                    Remember -  BRUSH YOUR TEETH THE MORNING OF SURGERY WITH YOUR REGULAR TOOTHPASTE   Do NOT smoke after Midnight   Take these medicines the morning of surgery with A SIP OF WATER: Lexapro, Rosuvastatin, Omeprazole, Myrebetiq   Bring CPAP mask  and tubing day of surgery.                              You may not have any metal on your body including hair pins, jewelry, and body piercing             Do not wear make-up, lotions, powders, perfumes/cologne, or deodorant  Do not wear nail polish including gel and S&S, artificial/acrylic nails, or any other type of covering on natural nails including finger and toenails. If you have artificial nails, gel coating, etc. that needs to be removed by a nail salon please have this removed prior to surgery or surgery may need to be canceled/ delayed if the surgeon/ anesthesia feels like they are unable to be safely monitored.   Do not shave  48 hours prior to surgery.     Do not bring valuables to the hospital. [place].   Contacts, dentures or bridgework may not be worn into surgery.   Bring small overnight bag day of surgery.   DO NOT [place]. PHARMACY WILL DISPENSE MEDICATIONS LISTED ON YOUR MEDICATION LIST TO YOU DURING YOUR ADMISSION [place]!       Special Instructions: Bring a copy of your healthcare power of attorney and living will documents    the day of surgery if you haven't scanned them before.              Please read over the following fact sheets you were given: IF YOU HAVE QUESTIONS ABOUT YOUR PRE-OP INSTRUCTIONS PLEASE CALL [phone]     [facility] - Preparing for Surgery Before surgery, you can play an important role.  Because skin is not sterile, your skin needs to be as free of germs as possible.  You can reduce the number of germs on your skin by washing with CHG (chlorahexidine gluconate) soap before surgery.  CHG is an antiseptic cleaner  which kills germs and bonds with the skin to continue killing germs even after washing. Please DO NOT use if you have an allergy to CHG or antibacterial soaps.  If your skin becomes reddened/irritated stop using the CHG and inform your nurse when you arrive at Short Stay. Do not shave (including legs and underarms) for at least 48 hours prior to the first CHG shower.   Please follow these instructions carefully:  1.  Shower with CHG Soap the night before surgery and the  morning of Surgery.  2.  If you choose to wash your hair, wash your hair first as usual with your  normal  shampoo.  3.  After you shampoo, rinse your hair and body thoroughly to remove the  shampoo.                            4.  Use CHG as you would any other liquid soap.  You can apply chg directly  to the skin and wash                       Gently with a scrungie or clean washcloth.  5.  Apply the CHG Soap to your body ONLY FROM THE NECK DOWN.   Do not use on face/ open  Wound or open sores. Avoid contact with eyes, ears mouth and genitals (private parts).                       Wash face,  Genitals (private parts) with your normal soap.             6.  Wash thoroughly, paying special attention to the area where your surgery  will be performed.  7.  Thoroughly rinse your body with warm water from the neck down.  8.  DO NOT shower/wash with your normal soap after using and rinsing off  the CHG Soap.                9.  Pat yourself dry with a clean towel.            10.  Wear clean pajamas.            11.  Place clean sheets on your bed the night of your first shower and do not  sleep with pets. Day of Surgery : Do not apply any lotions/deodorants the morning of surgery.  Please wear clean clothes to the hospital/surgery center.  FAILURE TO FOLLOW THESE INSTRUCTIONS MAY RESULT IN THE CANCELLATION OF YOUR SURGERY    ________________________________________________________________________    Incentive Spirometer  An incentive spirometer is a tool that can help keep your lungs clear and active. This tool measures how well you are filling your lungs with each breath. Taking long deep breaths may help reverse or decrease the chance of developing breathing (pulmonary) problems (especially infection) following: A long period of time when you are unable to move or be active. BEFORE THE PROCEDURE  If the spirometer includes an indicator to show your best effort, your nurse or respiratory therapist will set it to a desired goal. If possible, sit up straight or lean slightly forward. Try not to slouch. Hold the incentive spirometer in an upright position. INSTRUCTIONS FOR USE  Sit on the edge of your bed if possible, or sit up as far as you can in bed or on a chair. Hold the incentive spirometer in an upright position. Breathe out normally. Place the mouthpiece in your mouth and seal your lips tightly around it. Breathe in slowly and as deeply as possible, raising the piston or the ball toward the top of the column. Hold your breath for 3-5 seconds or for as long as possible. Allow the piston or ball to fall to the bottom of the column. Remove the mouthpiece from your mouth and breathe out normally. Rest for a few seconds and repeat Steps 1 through 7 at least 10 times every 1-2 hours when you are awake. Take your time and take a few normal breaths between deep breaths. The spirometer may include an indicator to show your best effort. Use the indicator as a goal to work toward during each repetition. After each set of 10 deep breaths, practice coughing to be sure your lungs are clear. If you have an incision (the cut made at the time of surgery), support your incision when coughing by placing a pillow or rolled up towels firmly against it. Once you are able to get out of bed, walk around indoors and cough well. You may stop using the incentive spirometer when instructed by your caregiver.   RISKS AND COMPLICATIONS Take your time so you do not get dizzy or light-headed. If you are in pain, you may need to take or ask for pain  medication before doing incentive spirometry. It is harder to take a deep breath if you are having pain. AFTER USE Rest and breathe slowly and easily. It can be helpful to keep track of a log of your progress. Your caregiver can provide you with a simple table to help with this. If you are using the spirometer at home, follow these instructions: [place] IF:  You are having difficultly using the spirometer. You have trouble using the spirometer as often as instructed. Your pain medication is not giving enough relief while using the spirometer. You develop fever of 100.5 F (38.1 C) or higher. SEEK IMMEDIATE MEDICAL CARE IF:  You cough up bloody sputum that had not been present before. You develop fever of 102 F (38.9 C) or greater. You develop worsening pain at or near the incision site. MAKE SURE YOU:  Understand these instructions. Will watch your condition. Will get help right away if you are not doing well or get worse. Document Released: 12/25/2006 Document Revised: 11/06/2011 Document Reviewed: 02/25/2007 [facility] Patient Information 2014 [place], [place].   ________________________________________________________________________

## 2022-05-05 NOTE — H&P (Signed)
TOTAL KNEE ADMISSION H&P  Patient is being admitted for right total knee arthroplasty.  Subjective:  Chief Complaint: Right knee pain.  HPI: Samantha Luna, 61 y.o. female has a history of pain and functional disability in the right knee due to arthritis and has failed non-surgical conservative treatments for greater than 12 weeks to include NSAID's and/or analgesics, use of assistive devices, and activity modification. Onset of symptoms was gradual, starting >10 years ago with gradually worsening course since that time. The patient noted no past surgery on the right knee.  Patient currently rates pain in the right knee at 7 out of 10 with activity. Patient has night pain, worsening of pain with activity and weight bearing, pain with passive range of motion, and crepitus. Patient has evidence of  advanced end-stage bone-on-bone arthritis in the medial compartment with a chondral defect in the medial tibial plateau and osteophyte formation  by imaging studies. There is no active infection.  Patient Active Problem List   Diagnosis Date Noted   Peripheral vascular disease 01/24/2022   Pre-diabetes 01/24/2022   Major depressive disorder 01/24/2022   Primary osteoarthritis of left knee 10/03/2021   Allergic contact dermatitis 09/12/2021   Primary hypertension 08/25/2021   Mixed hyperlipidemia 08/25/2021   Obesity (BMI 30-39.9) 08/25/2021   Musculoskeletal chest pain 08/25/2021   BMI 37.0-37.9, adult 08/18/2021   Obstructive sleep apnea 08/18/2021   Grief 08/18/2021   CPAP (continuous positive airway pressure) dependence 11/10/2020   Osteoarthritis of right knee 11/10/2020   History of COVID-19 07/20/2020   Atherosclerosis of native arteries of extremities with intermittent claudication, right leg 08/11/2019   Palpitations 01/06/2019   Laryngopharyngeal reflux 08/14/2018   High cholesterol 02/11/2016   COPD (chronic obstructive pulmonary disease) 02/11/2016   Chest pain 05/07/2010    Congestive heart failure 04/26/2010   Edema of both legs 04/26/2010   Meningioma 04/23/2010    Past Medical History:  Diagnosis Date   Acute exacerbation of chronic obstructive airways disease 11/09/2017   Hospitalization 11/09/17 in [place]   Allergic contact dermatitis 09/12/2021   Asthma    Atherosclerosis of native arteries of extremities with intermittent claudication, right leg 08/11/2019   Bell's palsy    Chest pain 57/84/6962   Complication of anesthesia    hysterectomy - slow to wake up   Congestive heart failure 04/26/2010   Echo 11/13/20 in [place] showed normal ejection fraction, normal echo.    COPD (chronic obstructive pulmonary disease) 02/11/2016   Edema of both legs 04/26/2010   GERD (gastroesophageal reflux disease)    Headache    High cholesterol 02/11/2016   History of COVID-19 07/20/2020   Major depressive disorder    Meningioma 04/23/2010   01/28/13 Head CT: A left posterior parafalcine meningioma is seen that measures 1.5 cm,  previously measuring 1.3 cm. Basal cisterns and sulci are unremarkable. Impression:  Left posterior parafalcine meningioma is mildly increased in size since  prior without significant surrounding mass effect or acute process visualized.   Acute or subacute ischemia is not excluded with CT.   Obstructive sleep apnea 08/18/2021   Palpitations 01/06/2019   Peripheral vascular disease    Pneumonia    Pre-diabetes    Primary hypertension 08/25/2021    Past Surgical History:  Procedure Laterality Date   ABDOMINAL HYSTERECTOMY     APPENDECTOMY     meniscus repair on right knee      TONSILLECTOMY     TOTAL KNEE ARTHROPLASTY Left 10/03/2021  Procedure: TOTAL KNEE ARTHROPLASTY;  Surgeon: Gaynelle Arabian, MD;  Location: [facility];  Service: Orthopedics;  Laterality: Left;    Prior to Admission medications   Medication Sig Start Date End Date Taking? Authorizing Provider  albuterol (VENTOLIN HFA) 108 (90 Base) MCG/ACT  inhaler Inhale 1-2 puffs into the lungs every 6 (six) hours as needed for wheezing or shortness of breath.   Yes [provider]  aspirin EC 81 MG tablet Take 81 mg by mouth daily. Swallow whole.   Yes [provider]  diclofenac (VOLTAREN) 75 MG EC tablet Take 75 mg by mouth daily.   Yes [provider]  escitalopram (LEXAPRO) 10 MG tablet Take 10 mg by mouth daily.   Yes [provider]  fluticasone (FLOVENT HFA) 220 MCG/ACT inhaler Inhale 2 puffs into the lungs 2 (two) times daily.   Yes [provider]  ibandronate (BONIVA) 150 MG tablet Take 150 mg by mouth every 30 (thirty) days.   Yes [provider]  lidocaine (LIDODERM) 5 % Place 1 patch onto the skin daily as needed (pain). Remove & Discard patch within 12 hours or as directed by MD   Yes [provider]  loratadine (CLARITIN) 10 MG tablet Take 10 mg by mouth daily as needed for allergies.   Yes [provider]  losartan-hydrochlorothiazide (HYZAAR) 100-25 MG tablet Take 1 tablet by mouth daily.   Yes [provider]  Multiple Vitamins-Minerals (MULTIVITAMIN WITH MINERALS) tablet Take 1 tablet by mouth daily.   Yes [provider]  MYRBETRIQ 50 MG TB24 tablet Take 50 mg by mouth every other day. 02/02/22  Yes [provider]  nitroGLYCERIN (NITROSTAT) 0.4 MG SL tablet Place 0.4 mg under the tongue every 5 (five) minutes as needed for chest pain.   Yes [provider]  omeprazole (PRILOSEC) 20 MG capsule Take 20 mg by mouth daily. 11/05/20  Yes [provider]  rosuvastatin (CRESTOR) 5 MG tablet Take 5 mg by mouth daily.   Yes [provider]  Vitamin D, Ergocalciferol, (DRISDOL) 1.25 MG (50000 UNIT) CAPS capsule Take 50,000 Units by mouth every Sunday. 03/09/21  Yes [provider]  gabapentin (NEURONTIN) 300 MG capsule Take a 300 mg capsule three times a day for two weeks following surgery.Then take a 300 mg  capsule two times a day for two weeks. Then take a 300 mg capsule once a day for two weeks. Then discontinue. Patient not taking: Reported on 05/04/2022 10/04/21   Bekka Qian, Ok Anis, PA  traMADol (ULTRAM) 50 MG tablet Take 1-2 tablets (50-100 mg total) by mouth every 6 (six) hours as needed for moderate pain. Patient not taking: Reported on 05/04/2022 10/04/21   Theresa Duty L, PA    Allergies  Allergen Reactions   Iodine Other (See Comments)    Uncontrollable sneezing    Codeine Itching    Social History   Socioeconomic History   Marital status: Single    Spouse name: Not on file   Number of children: Not on file   Years of education: 12   Highest education level: High school graduate  Occupational History   Occupation: Retired    Comment: Recruitment consultant  Tobacco Use   Smoking status: Former    Packs/day: 1.00    Years: 40.00    Total pack years: 40.00    Types: Cigarettes    Quit date: 08/28/2021    Years since quitting: 0.6   Smokeless tobacco: Never  Vaping Use  Vaping Use: Never used  Substance and Sexual Activity   Alcohol use: Yes    Comment: Very infrequent   Drug use: Yes    Types: Marijuana    Comment: Pt reports that a joint will last her several months   Sexual activity: Not Currently  Other Topics Concern   Not on file  Social History Narrative   Not on file   Social Determinants of Health   Financial Resource Strain: Not on file  Food Insecurity: Not on file  Transportation Needs: Not on file  Physical Activity: Not on file  Stress: Not on file  Social Connections: Not on file  Intimate Partner Violence: Not on file    Tobacco Use: Medium Risk (04/28/2022)   Patient History    Smoking Tobacco Use: Former    Smokeless Tobacco Use: Never    Passive Exposure: Not on file   Social History   Substance and Sexual Activity  Alcohol Use Yes   Comment: Very infrequent    Family History  Problem Relation Age of Onset   Memory loss Mother     Dementia Father    Asthma Sister    Immunodeficiency Daughter    Asthma Daughter    Anxiety disorder Daughter    Depression Daughter    Depression Daughter    Asthma Daughter    Bipolar disorder Daughter    Immunodeficiency Grandson    Asthma Grandson     Review of Systems  Constitutional:  Negative for chills and fever.  HENT:  Negative for congestion, sore throat and tinnitus.   Eyes:  Negative for double vision, photophobia and pain.  Respiratory:  Negative for cough, shortness of breath and wheezing.   Cardiovascular:  Negative for chest pain, palpitations and orthopnea.  Gastrointestinal:  Negative for heartburn, nausea and vomiting.  Genitourinary:  Negative for dysuria, frequency and urgency.  Musculoskeletal:  Positive for joint pain.  Neurological:  Negative for dizziness, weakness and headaches.    Objective:  Physical Exam: Well nourished and well developed.  General: Alert and oriented x3, cooperative and pleasant, no acute distress.  Head: normocephalic, atraumatic, neck supple.  Eyes: EOMI.  Musculoskeletal:   Right Knee Exam:  No effusion present. No swelling present.  The range of motion is: 0 to 125 degrees.  Moderate crepitus on range of motion of the knee.  No medial joint line tenderness.  No lateral joint line tenderness.  The knee is stable.  Calves soft and nontender. Motor function intact in LE. Strength 5/5 LE bilaterally. Neuro: Distal pulses 2+. Sensation to light touch intact in LE.  Imaging Review Plain radiographs demonstrate severe degenerative joint disease of the right knee. The overall alignment is mild varus. The bone quality appears to be adequate for age and reported activity level.  Assessment/Plan:  End stage arthritis, right knee   The patient history, physical examination, clinical judgment of the provider and imaging studies are consistent with end stage degenerative joint disease of the right knee and total knee  arthroplasty is deemed medically necessary. The treatment options including medical management, injection therapy arthroscopy and arthroplasty were discussed at length. The risks and benefits of total knee arthroplasty were presented and reviewed. The risks due to aseptic loosening, infection, stiffness, patella tracking problems, thromboembolic complications and other imponderables were discussed. The patient acknowledged the explanation, agreed to proceed with the plan and consent was signed. Patient is being admitted for inpatient treatment for surgery, pain control, PT, OT, prophylactic antibiotics, VTE prophylaxis,  progressive ambulation and ADLs and discharge planning. The patient is planning to be discharged  home .   Patient's anticipated LOS is less than 2 midnights, meeting these requirements: - Younger than 76 - Lives within 1 hour of care - Has a competent adult at home to recover with post-op recover - NO history of  - Chronic pain requiring opioids  - Diabetes  - Coronary Artery Disease  - Heart failure  - Heart attack  - Stroke  - DVT/VTE  - Cardiac arrhythmia  - Renal failure  - Anemia  - Advanced Liver disease  Therapy Plans: Outpatient therapy at [facility]) Disposition: Home with daughter and family Planned DVT Prophylaxis: Aspirin 325 mg BID DME Needed: None PCP: Vanita Panda, MD (clearance received) Cardiologist: Berniece Salines, DO (clearance received) TXA: IV Allergies: Codeine, iodine Anesthesia Concerns: None BMI: 39.3 Last HgbA1c: Not diabetic.  [facility]acy: [facility] (have brought to room)  Other: - Hydromorphone, tramadol, methocarbamol, gabapentin, aspirin  - Patient was instructed on what medications to stop prior to surgery. - Follow-up visit in 2 weeks with Dr. Wynelle Link - Begin physical therapy following surgery - Pre-operative lab work as pre-surgical testing - Prescriptions will be provided in hospital at time of discharge  Theresa Duty, PA-C Orthopedic Surgery [facility] [place]

## 2022-05-06 ENCOUNTER — Other Ambulatory Visit: Payer: Self-pay | Admitting: Family Medicine

## 2022-05-10 ENCOUNTER — Other Ambulatory Visit: Payer: Self-pay

## 2022-05-10 ENCOUNTER — Encounter (HOSPITAL_COMMUNITY): Payer: Self-pay

## 2022-05-10 ENCOUNTER — Encounter (HOSPITAL_COMMUNITY)
Admission: RE | Admit: 2022-05-10 | Discharge: 2022-05-10 | Disposition: A | Discharge status: Home / Self Care | Payer: Medicare Other | Source: Ambulatory Visit | Attending: Orthopedic Surgery | Admitting: Orthopedic Surgery

## 2022-05-10 DIAGNOSIS — R7303 Prediabetes: Secondary | ICD-10-CM | POA: Diagnosis not present

## 2022-05-10 DIAGNOSIS — Z01818 Encounter for other preprocedural examination: Secondary | ICD-10-CM

## 2022-05-10 DIAGNOSIS — Z01812 Encounter for preprocedural laboratory examination: Secondary | ICD-10-CM | POA: Insufficient documentation

## 2022-05-10 HISTORY — DX: Dyspnea, unspecified: R06.00

## 2022-05-10 LAB — CBC
HCT: 38.6 % (ref 36.0–46.0)
Hemoglobin: 12.4 g/dL (ref 12.0–15.0)
MCH: 28.6 pg (ref 26.0–34.0)
MCHC: 32.1 g/dL (ref 30.0–36.0)
MCV: 89.1 fL (ref 80.0–100.0)
Platelets: 308 10*3/uL (ref 150–400)
RBC: 4.33 MIL/uL (ref 3.87–5.11)
RDW: 12.7 % (ref 11.5–15.5)
WBC: 4.5 10*3/uL (ref 4.0–10.5)
nRBC: 0 % (ref 0.0–0.2)

## 2022-05-10 LAB — BASIC METABOLIC PANEL
Anion gap: 7 (ref 5–15)
BUN: 17 mg/dL (ref 8–23)
CO2: 25 mmol/L (ref 22–32)
Calcium: 9.6 mg/dL (ref 8.9–10.3)
Chloride: 108 mmol/L (ref 98–111)
Creatinine, Ser: 0.66 mg/dL (ref 0.44–1.00)
GFR, Estimated: >60 mL/min (ref >60)
Glucose, Bld: 153 mg/dL — ABNORMAL HIGH (ref 70–99)
Potassium: 3.4 mmol/L — ABNORMAL LOW (ref 3.5–5.1)
Sodium: 140 mmol/L (ref 135–145)

## 2022-05-10 LAB — HEMOGLOBIN A1C
Hgb A1c MFr Bld: 5.9 % — ABNORMAL HIGH (ref 4.8–5.6)
Mean Plasma Glucose: 122.63 mg/dL

## 2022-05-10 LAB — SURGICAL PCR SCREEN
MRSA, PCR: NEGATIVE
Staphylococcus aureus: NEGATIVE

## 2022-05-10 LAB — GLUCOSE, CAPILLARY: Glucose-Capillary: 160 mg/dL — ABNORMAL HIGH (ref 70–99)

## 2022-05-10 NOTE — Progress Notes (Signed)
Anesthesia note:  Bowel prep reminder:NA  PCP - Dr. Luanna Salk Cardiologist -Dr. Raliegh Ip. [place] Other- nero- Lovie Macadamia PA  Chest x-ray - 10/06/21-epic EKG - 10/05/21-epic Stress Test - 08/18/21-epic ECHO - no Cardiac Cath - no  Pacemaker/ICD device last checked:NA  Sleep Study - yes CPAP - yes  Pt is pre diabetic-Yes CBG 160 at PAT Fasting Blood Sugar -  Checks Blood Sugar _____  Blood Thinner:[facility] / Dr. Harriet Masson Blood Thinner Instructions: Aspirin Instructions:stop 7 days/ Dr Wynelle Link Last Dose:05/14/22  Anesthesia review: yes  Patient denies shortness of breath, fever, cough and chest pain at PAT appointment Pt has SOB with exertion and uses her inhaler when needed. She feels it is doing better.  Patient verbalized understanding of instructions that were given to them at the PAT appointment. Patient was also instructed that they will need to review over the PAT instructions again at home before surgery. yes

## 2022-05-15 ENCOUNTER — Encounter: Payer: Self-pay | Admitting: Pulmonary Disease

## 2022-05-15 ENCOUNTER — Ambulatory Visit (INDEPENDENT_AMBULATORY_CARE_PROVIDER_SITE_OTHER): Payer: Medicare Other | Admitting: Pulmonary Disease

## 2022-05-15 VITALS — BP 124/80 | HR 79 | Temp 97.5°F | Ht 62.0 in | Wt 204.0 lb

## 2022-05-15 DIAGNOSIS — G4733 Obstructive sleep apnea (adult) (pediatric): Secondary | ICD-10-CM

## 2022-05-15 DIAGNOSIS — Z9989 Dependence on other enabling machines and devices: Secondary | ICD-10-CM | POA: Diagnosis not present

## 2022-05-15 NOTE — Progress Notes (Addendum)
Anesthesia Chart Review   Case: [id number] Date/Time: 05/22/22 1015   Procedure: TOTAL KNEE ARTHROPLASTY (Right: Knee)   Anesthesia type: Choice   Pre-op diagnosis: right knee osteoarthritis   Location: [facility] ROOM 10 / [facility]   Surgeons: Gaynelle Arabian, MD       DISCUSSION:61 y.o. former smoker with h/o asthma, CHF, COPD, HTN, PVD, OSA, right knee OA scheduled for above procedure 05/22/2022 with Dr. Gaynelle Arabian.   Pt last seen by pulmonology 05/15/2022. Per OV note, "-Has been stable from a sleep apnea and asthma perspective has no significant preclusion to surgery -Patient is cleared from a respiratory perspective for surgery"  Pt last seen by cardiology 04/24/2022. Per OV note, "Ms. Grall's perioperative risk of a major cardiac event is 0.9% according to the Revised Cardiac Risk Index (RCRI).  Therefore, she is at low risk for perioperative complications.   Her functional capacity is good at 4.06 METs according to the Duke Activity Status Index (DASI). Recommendations: According to ACC/AHA guidelines, no further cardiovascular testing needed.  The patient may proceed to surgery at acceptable risk.   Antiplatelet and/or Anticoagulation Recommendations: Aspirin can be held for 7 days prior to her surgery.  Please resume Aspirin post operatively when it is felt to be safe from a bleeding standpoint. "  Echo 03/2010 ([place]): EF 60, mild LVH, diastolic dysfunction  Anticipate pt can proceed with planned procedure barring acute status change.   VS: BP (!) 156/81   Pulse 85   Temp 37.1 C (Oral)   Resp 18   Ht '5\' 2"'$  (1.575 m)   Wt 93.4 kg   SpO2 97%   BMI 37.68 kg/m   PROVIDERS: Wendie Simmer, MD is PCP   Primary Cardiologist:  Berniece Salines, DO LABS: Labs reviewed: Acceptable for surgery. (all labs ordered are listed, but only abnormal results are displayed)  Labs Reviewed  HEMOGLOBIN A1C - Abnormal; Notable for the following components:      Result Value   Hgb A1c MFr  Bld 5.9 (*)    All other components within normal limits  BASIC METABOLIC PANEL - Abnormal; Notable for the following components:   Potassium 3.4 (*)    Glucose, Bld 153 (*)    All other components within normal limits  GLUCOSE, CAPILLARY - Abnormal; Notable for the following components:   Glucose-Capillary 160 (*)    All other components within normal limits  SURGICAL PCR SCREEN  CBC     IMAGES:   EKG:   CV: CT Coronary 08/18/21 IMPRESSION: 1. Coronary calcium score of 0.   2. Normal coronary origin with right dominance.   3. No evidence of CAD. There are significant step artifacts due to cardiac motion, but no CAD is seen   CAD-RADS 0. No evidence of CAD (0%). Consider non-atherosclerotic causes of chest pain. Past Medical History:  Diagnosis Date   Acute exacerbation of chronic obstructive airways disease 11/09/2017   Hospitalization 11/09/17 in [place]   Allergic contact dermatitis 09/12/2021   Asthma    Atherosclerosis of native arteries of extremities with intermittent claudication, right leg 08/11/2019   Bell's palsy    Chest pain 57/84/6962   Complication of anesthesia    hysterectomy - slow to wake up   Congestive heart failure 04/26/2010   Echo 11/13/20 in [place] showed normal ejection fraction, normal echo.    COPD (chronic obstructive pulmonary disease) 02/11/2016   Dyspnea    GERD (gastroesophageal reflux disease)  High cholesterol 02/11/2016   History of COVID-19 07/20/2020   Major depressive disorder    Meningioma 04/23/2010   01/28/13 Head CT: A left posterior parafalcine meningioma is seen that measures 1.5 cm,  previously measuring 1.3 cm. Basal cisterns and sulci are unremarkable. Impression:  Left posterior parafalcine meningioma is mildly increased in size since  prior without significant surrounding mass effect or acute process visualized.   Acute or subacute ischemia is not excluded with CT.   Obstructive sleep apnea  08/18/2021   Peripheral vascular disease    Pneumonia 2020   Pre-diabetes    Primary hypertension 08/25/2021    Past Surgical History:  Procedure Laterality Date   ABDOMINAL HYSTERECTOMY  2017   meniscus repair on right knee  Right 2017   TONSILLECTOMY     as a child   TOTAL KNEE ARTHROPLASTY Left 10/03/2021   Procedure: TOTAL KNEE ARTHROPLASTY;  Surgeon: Gaynelle Arabian, MD;  Location: [facility];  Service: Orthopedics;  Laterality: Left;    MEDICATIONS:  albuterol (VENTOLIN HFA) 108 (90 Base) MCG/ACT inhaler   aspirin EC 81 MG tablet   diclofenac (VOLTAREN) 75 MG EC tablet   escitalopram (LEXAPRO) 10 MG tablet   fluticasone (FLOVENT HFA) 220 MCG/ACT inhaler   ibandronate (BONIVA) 150 MG tablet   lidocaine (LIDODERM) 5 %   loratadine (CLARITIN) 10 MG tablet   losartan-hydrochlorothiazide (HYZAAR) 100-25 MG tablet   Multiple Vitamins-Minerals (MULTIVITAMIN WITH MINERALS) tablet   MYRBETRIQ 50 MG TB24 tablet   nitroGLYCERIN (NITROSTAT) 0.4 MG SL tablet   omeprazole (PRILOSEC) 20 MG capsule   rosuvastatin (CRESTOR) 5 MG tablet   traMADol (ULTRAM) 50 MG tablet   Vitamin D, Ergocalciferol, (DRISDOL) 1.25 MG (50000 UNIT) CAPS capsule   No current facility-administered medications for this encounter.    Konrad Felix Ward, PA-C [facility] [phone]

## 2022-05-15 NOTE — Anesthesia Preprocedure Evaluation (Addendum)
Anesthesia Evaluation  Patient identified by MRN, date of birth, ID band Patient awake    Reviewed: Allergy & Precautions, NPO status , Patient's Chart, lab work & pertinent test results  Airway Mallampati: II  TM Distance: >3 FB Neck ROM: Full    Dental no notable dental hx.    Pulmonary asthma , sleep apnea , COPD, former smoker,    Pulmonary exam normal breath sounds clear to auscultation       Cardiovascular hypertension, Pt. on medications + Peripheral Vascular Disease and +CHF  Normal cardiovascular exam Rhythm:Regular Rate:Normal     Neuro/Psych Depression negative neurological ROS  negative psych ROS   GI/Hepatic Neg liver ROS, GERD  ,  Endo/Other  negative endocrine ROS  Renal/GU negative Renal ROS  negative genitourinary   Musculoskeletal  (+) Arthritis , Osteoarthritis,    Abdominal (+) + obese,   Peds negative pediatric ROS (+)  Hematology negative hematology ROS (+)   Anesthesia Other Findings   Reproductive/Obstetrics negative OB ROS                            Anesthesia Physical Anesthesia Plan  ASA: 3  Anesthesia Plan: Spinal and Regional   Post-op Pain Management: Regional block*   Induction: Intravenous  PONV Risk Score and Plan: 2 and Ondansetron, Midazolam and Treatment may vary due to age or medical condition  Airway Management Planned: Simple Face Mask  Additional Equipment:   Intra-op Plan:   Post-operative Plan:   Informed Consent: I have reviewed the patients History and Physical, chart, labs and discussed the procedure including the risks, benefits and alternatives for the proposed anesthesia with the patient or authorized representative who has indicated his/her understanding and acceptance.     Dental advisory given  Plan Discussed with: CRNA  Anesthesia Plan Comments: (See PAT note 05/10/2022)       Anesthesia Quick Evaluation

## 2022-05-15 NOTE — Patient Instructions (Signed)
Obstructive sleep apnea -Continue to work on using CPAP on a nightly basis  Continue regular exercises  Continue inhalers for your asthma  You are cleared to have surgery  Call us with significant concerns

## 2022-05-15 NOTE — Progress Notes (Signed)
Abbe Bula    [id number]    05-11-1961  Primary Care Physician:Roberson, Carmell Austria, MD  Referring Physician: Wendie Simmer, MD [address] [place],  [place] [address]  Chief complaint:   Patient with a history of obstructive sleep apnea Has been trying to use CPAP nightly  HPI:  Tolerating CPAP better  Recently moved into a new apartment and not to have things and steroids -Has not been as compliant with CPAP use but was not having any significant issues with using CPAP  She does have a history of asthma for which she uses Flovent and albuterol -Uses both medications just as needed  Continues to exercise on a regular basis  She is scheduled for knee replacement surgery, did tolerate left knee surgery well previously now scheduled for right knee surgery  Breathing has been stable with no significant exacerbation of asthma  Benefit from CPAP when she is able to use it on a regular basis  Most recent home sleep study on 12/23/2021 with an AHI of 6  Post knee surgery which she tolerated well-left knee  She currently she recollects that the study showed that she stop breathing about 11 times an hour -Recent study shows AHI of 6  [place] Used CPAP regularly and then got away from using it Usually goes to bed about 7 PM, final wake up time about 4 AM, she does have about 1 or 2 awakenings Denies any headaches in the morning She does have dryness of her mouth in the mornings No family history of sleep apnea known to her She does have a history of hypertension, has had a negative stress test recently, does have a history of angina  PFT from 2020 did reveal mild obstructive disease, mild restriction  Outpatient Encounter Medications as of 05/15/2022  Medication Sig   albuterol (VENTOLIN HFA) 108 (90 Base) MCG/ACT inhaler Inhale 1-2 puffs into the lungs every 6 (six) hours as needed for wheezing or shortness of breath.    aspirin EC 81 MG tablet Take 81 mg by mouth daily. Swallow whole.   diclofenac (VOLTAREN) 75 MG EC tablet Take 75 mg by mouth daily.   escitalopram (LEXAPRO) 10 MG tablet Take 10 mg by mouth daily.   fluticasone (FLOVENT HFA) 220 MCG/ACT inhaler Inhale 2 puffs into the lungs 2 (two) times daily.   gabapentin (NEURONTIN) 300 MG capsule Take a 300 mg capsule three times a day for two weeks following surgery.Then take a 300 mg capsule two times a day for two weeks. Then take a 300 mg capsule once a day for two weeks. Then discontinue. (Patient not taking: Reported on 05/04/2022)   ibandronate (BONIVA) 150 MG tablet Take 150 mg by mouth every 30 (thirty) days.   lidocaine (LIDODERM) 5 % Place 1 patch onto the skin daily as needed (pain). Remove & Discard patch within 12 hours or as directed by MD   loratadine (CLARITIN) 10 MG tablet Take 10 mg by mouth daily as needed for allergies.   losartan-hydrochlorothiazide (HYZAAR) 100-25 MG tablet Take 1 tablet by mouth daily.   Multiple Vitamins-Minerals (MULTIVITAMIN WITH MINERALS) tablet Take 1 tablet by mouth daily.   MYRBETRIQ 50 MG TB24 tablet Take 50 mg by mouth every other day.   nitroGLYCERIN (NITROSTAT) 0.4 MG SL tablet Place 0.4 mg under the tongue every 5 (five) minutes as needed for chest pain.   omeprazole (PRILOSEC) 20 MG capsule  Take 20 mg by mouth daily.   rosuvastatin (CRESTOR) 5 MG tablet Take 5 mg by mouth daily.   traMADol (ULTRAM) 50 MG tablet Take 1-2 tablets (50-100 mg total) by mouth every 6 (six) hours as needed for moderate pain. (Patient not taking: Reported on 05/04/2022)   Vitamin D, Ergocalciferol, (DRISDOL) 1.25 MG (50000 UNIT) CAPS capsule Take 50,000 Units by mouth every Sunday.   No facility-administered encounter medications on file as of 05/15/2022.    Allergies as of 05/15/2022 - Review Complete 05/10/2022  Allergen Reaction Noted   Iodine Other (See Comments) 05/16/2021   Codeine Itching 05/16/2021    Past Medical  History:  Diagnosis Date   Acute exacerbation of chronic obstructive airways disease 11/09/2017   Hospitalization 11/09/17 in [place]   Allergic contact dermatitis 09/12/2021   Asthma    Atherosclerosis of native arteries of extremities with intermittent claudication, right leg 08/11/2019   Bell's palsy    Chest pain 63/14/9702   Complication of anesthesia    hysterectomy - slow to wake up   Congestive heart failure 04/26/2010   Echo 11/13/20 in [place] showed normal ejection fraction, normal echo.    COPD (chronic obstructive pulmonary disease) 02/11/2016   Dyspnea    GERD (gastroesophageal reflux disease)    High cholesterol 02/11/2016   History of COVID-19 07/20/2020   Major depressive disorder    Meningioma 04/23/2010   01/28/13 Head CT: A left posterior parafalcine meningioma is seen that measures 1.5 cm,  previously measuring 1.3 cm. Basal cisterns and sulci are unremarkable. Impression:  Left posterior parafalcine meningioma is mildly increased in size since  prior without significant surrounding mass effect or acute process visualized.   Acute or subacute ischemia is not excluded with CT.   Obstructive sleep apnea 08/18/2021   Peripheral vascular disease    Pneumonia 2020   Pre-diabetes    Primary hypertension 08/25/2021    Past Surgical History:  Procedure Laterality Date   ABDOMINAL HYSTERECTOMY  2017   meniscus repair on right knee  Right 2017   TONSILLECTOMY     as a child   TOTAL KNEE ARTHROPLASTY Left 10/03/2021   Procedure: TOTAL KNEE ARTHROPLASTY;  Surgeon: Gaynelle Arabian, MD;  Location: [facility];  Service: Orthopedics;  Laterality: Left;    Family History  Problem Relation Age of Onset   Memory loss Mother    Dementia Father    Asthma Sister    Immunodeficiency Daughter    Asthma Daughter    Anxiety disorder Daughter    Depression Daughter    Depression Daughter    Asthma Daughter    Bipolar disorder Daughter    Immunodeficiency  Grandson    Asthma Grandson     Social History   Socioeconomic History   Marital status: Single    Spouse name: Not on file   Number of children: Not on file   Years of education: 12   Highest education level: High school graduate  Occupational History   Occupation: Retired    Comment: Recruitment consultant  Tobacco Use   Smoking status: Former    Packs/day: 1.00    Years: 40.00    Total pack years: 40.00    Types: Cigarettes    Quit date: 08/28/2021    Years since quitting: 0.7   Smokeless tobacco: Never  Vaping Use   Vaping Use: Never used  Substance and Sexual Activity   Alcohol use: Yes    Comment: Very infrequent  Drug use: Not Currently    Types: Marijuana    Comment: Pt reports that a joint will last her several months   Sexual activity: Not Currently  Other Topics Concern   Not on file  Social History Narrative   Not on file   Social Determinants of Health   Financial Resource Strain: Not on file  Food Insecurity: Not on file  Transportation Needs: Not on file  Physical Activity: Not on file  Stress: Not on file  Social Connections: Not on file  Intimate Partner Violence: Not on file    Review of Systems  Constitutional:  Negative for fatigue and fever.  Respiratory:  Positive for shortness of breath.   Psychiatric/Behavioral:  Positive for sleep disturbance.     There were no vitals filed for this visit.    Physical Exam Constitutional:      Appearance: She is obese.  HENT:     Head: Normocephalic.     Right Ear: Tympanic membrane normal.     Nose: No congestion.     Mouth/Throat:     Mouth: Mucous membranes are moist.     Comments: Mallampati 4, crowded oropharynx, macroglossia Eyes:     Pupils: Pupils are equal, round, and reactive to light.  Cardiovascular:     Rate and Rhythm: Normal rate and regular rhythm.     Heart sounds: No murmur heard.    No friction rub.  Pulmonary:     Effort: No respiratory distress.     Breath sounds: No  stridor. No wheezing or rhonchi.  Musculoskeletal:     Cervical back: No rigidity or tenderness.  Neurological:     Mental Status: She is alert.  Psychiatric:        Mood and Affect: Mood normal.       09/09/2021    9:00 AM  Results of the Epworth flowsheet  Sitting and reading 3  Watching TV 3  Sitting, inactive in a public place (e.g. a theatre or a meeting) 2  As a passenger in a car for an hour without a break 2  Lying down to rest in the afternoon when circumstances permit 3  Sitting and talking to someone 2  Sitting quietly after a lunch without alcohol 2  In a car, while stopped for a few minutes in traffic 0  Total score 17   Data Reviewed:  Most recent compliance data runs from 03/28/2022-04/26/2022 -70% compliance Average use of 5 hours 7 CPAP of 6 Residual AHI of 1  A previous PFT from 2020 was brought in by her daughter showing mild obstructive disease, mild restrictive disease-reviewed  Assessment:   .  History of obstructive sleep apnea -Encouraged to continue working on compliance -Encouraged to use CPAP nightly -Continue weight loss efforts  .  History of asthma -Uses Flovent and albuterol as needed -Has not had recent exacerbation  .  Preop evaluation for knee surgery -Has been stable from a sleep apnea and asthma perspective has no significant preclusion to surgery -Patient is cleared from a respiratory perspective for surgery  . Plan/Recommendations:  .  Cleared for surgery from a respiratory perspective  .  Encouraged to continue using CPAP on a nightly basis  .  Use inhalers as prescribed  .  Follow-up in 6 months   Sherrilyn Rist MD [facility] 05/15/2022, 10:00 AM  CC: Wendie Simmer, MD

## 2022-05-17 ENCOUNTER — Emergency Department (HOSPITAL_COMMUNITY)
Admission: EM | Admit: 2022-05-17 | Discharge: 2022-05-17 | Disposition: A | Discharge status: Home / Self Care | Payer: Medicare Other | Attending: Emergency Medicine | Admitting: Emergency Medicine

## 2022-05-17 ENCOUNTER — Emergency Department (HOSPITAL_COMMUNITY): Payer: Medicare Other

## 2022-05-17 ENCOUNTER — Other Ambulatory Visit: Payer: Self-pay

## 2022-05-17 ENCOUNTER — Encounter (HOSPITAL_COMMUNITY): Payer: Self-pay

## 2022-05-17 DIAGNOSIS — S29011A Strain of muscle and tendon of front wall of thorax, initial encounter: Secondary | ICD-10-CM | POA: Insufficient documentation

## 2022-05-17 DIAGNOSIS — S4992XA Unspecified injury of left shoulder and upper arm, initial encounter: Secondary | ICD-10-CM | POA: Diagnosis present

## 2022-05-17 DIAGNOSIS — X58XXXA Exposure to other specified factors, initial encounter: Secondary | ICD-10-CM | POA: Insufficient documentation

## 2022-05-17 DIAGNOSIS — R109 Unspecified abdominal pain: Secondary | ICD-10-CM | POA: Diagnosis not present

## 2022-05-17 DIAGNOSIS — J449 Chronic obstructive pulmonary disease, unspecified: Secondary | ICD-10-CM | POA: Diagnosis not present

## 2022-05-17 DIAGNOSIS — I509 Heart failure, unspecified: Secondary | ICD-10-CM | POA: Insufficient documentation

## 2022-05-17 DIAGNOSIS — S46912A Strain of unspecified muscle, fascia and tendon at shoulder and upper arm level, left arm, initial encounter: Secondary | ICD-10-CM | POA: Insufficient documentation

## 2022-05-17 DIAGNOSIS — Z7982 Long term (current) use of aspirin: Secondary | ICD-10-CM | POA: Insufficient documentation

## 2022-05-17 LAB — CBC
HCT: 40.2 % (ref 36.0–46.0)
Hemoglobin: 13.2 g/dL (ref 12.0–15.0)
MCH: 28.6 pg (ref 26.0–34.0)
MCHC: 32.8 g/dL (ref 30.0–36.0)
MCV: 87 fL (ref 80.0–100.0)
Platelets: 310 10*3/uL (ref 150–400)
RBC: 4.62 MIL/uL (ref 3.87–5.11)
RDW: 12.9 % (ref 11.5–15.5)
WBC: 5.6 10*3/uL (ref 4.0–10.5)
nRBC: 0 % (ref 0.0–0.2)

## 2022-05-17 LAB — BASIC METABOLIC PANEL
Anion gap: 11 (ref 5–15)
BUN: 9 mg/dL (ref 8–23)
CO2: 23 mmol/L (ref 22–32)
Calcium: 9.8 mg/dL (ref 8.9–10.3)
Chloride: 104 mmol/L (ref 98–111)
Creatinine, Ser: 0.92 mg/dL (ref 0.44–1.00)
GFR, Estimated: >60 mL/min (ref >60)
Glucose, Bld: 130 mg/dL — ABNORMAL HIGH (ref 70–99)
Potassium: 3.1 mmol/L — ABNORMAL LOW (ref 3.5–5.1)
Sodium: 138 mmol/L (ref 135–145)

## 2022-05-17 LAB — BRAIN NATRIURETIC PEPTIDE: B Natriuretic Peptide: 6.7 pg/mL (ref 0.0–100.0)

## 2022-05-17 LAB — TROPONIN I (HIGH SENSITIVITY)
Troponin I (High Sensitivity): 5 ng/L (ref <18)
Troponin I (High Sensitivity): 5 ng/L (ref <18)

## 2022-05-17 MED ORDER — METHOCARBAMOL 500 MG PO TABS: 500.0000 mg | ORAL_TABLET | Freq: Two times a day (BID) | ORAL | 0 refills | Status: DC

## 2022-05-17 MED ORDER — POTASSIUM CHLORIDE CRYS ER 20 MEQ PO TBCR
40.0000 meq | EXTENDED_RELEASE_TABLET | Freq: Two times a day (BID) | ORAL | Status: DC
  Filled 2022-05-17: qty 2

## 2022-05-17 MED ORDER — METHOCARBAMOL 500 MG PO TABS
500.0000 mg | ORAL_TABLET | Freq: Once | ORAL | Status: DC
  Filled 2022-05-17: qty 1

## 2022-05-17 NOTE — Discharge Instructions (Addendum)
You were seen in the emergency department today for yourshoulder and chest wall pain.  Your physical exam and vital signs are very reassuring.  The muscles in your shoulder and neck are in what is called spasm, meaning they are inappropriately tightened up.  This can be quite painful.  To help with your pain you may take Tylenol and / or NSAID medication (such as ibuprofen or naproxen) to help with your pain.  Additionally, you have been prescribed a muscle relaxer called Robaxin to help relieve some of the muscle spasm. This was sen to the [place] near [employer] on [address].  Please be advised that this medication may make you very sleepy, so you should not drive or operate heavy machinery while you are taking it.  You may also utilize topical pain relief such as Biofreeze, IcyHot, or topical lidocaine patches.  I also recommend that you apply heat to the area, such as a hot shower or heating pad, and follow heat application with massage of the muscles that are most tight.  Please return to the emergency department if you develop any numbness/tingling/weakness in your arms or legs, any difficulty urinating, or urinary incontinence chest pain, shortness of breath, abdominal pain, nausea or vomiting that does not stop, or any other new severe symptoms.

## 2022-05-17 NOTE — ED Triage Notes (Addendum)
Pt came in POV for pain in her Left side that is from her groin all the way to her ear, She describes this as her Lt hand keeps "locking up" & she cannot raise her arm very high d/t shoulder pain. Denies any recent injuries or falls. She endorses Chest tightness from her CHF that started this morning as well with some SOB. A/Ox4.

## 2022-05-17 NOTE — ED Notes (Signed)
Pt verbalized understanding of d/c instructions, meds, and followup care. Denies questions. VSS, no distress noted. Steady gait to exit with all belongings.  ?

## 2022-05-17 NOTE — ED Provider Triage Note (Cosign Needed Addendum)
Emergency Medicine Provider Triage Evaluation Note  Samantha Luna , a 61 y.o. female  was evaluated in triage.  Pt complains of cp. Report pain to L arm x 1 week.  Started to have pain to her chest along with pain from L groin up to the L arm today.  Endorse sob and attributed to her CHF.  Denies fever, productive cough, focal weakness.  Review of Systems  Positive: As above Negative: As above  Physical Exam  BP (!) 149/68 (BP Location: Left Leg)   Pulse 83   Temp 98.3 F (36.8 C) (Oral)   Resp (!) 22   Ht '5\' 2"'$  (1.575 m)   Wt 92.1 kg   SpO2 97%   BMI 37.13 kg/m  Gen:   Awake, no distress   Resp:  Normal effort  MSK:   Moves extremities without difficulty  Other:    Medical Decision Making  Medically screening exam initiated at 12:27 PM.  Appropriate orders placed.  Dylin Ihnen was informed that the remainder of the evaluation will be completed by another provider, this initial triage assessment does not replace that evaluation, and the importance of remaining in the ED until their evaluation is complete.  Considered dissection but felt less likely     Domenic Moras, [place] 05/17/22 1232

## 2022-05-17 NOTE — ED Provider Notes (Signed)
[facility] EMERGENCY DEPARTMENT Provider Note   CSN: [id number] Arrival date & time: 05/17/22  1114     History  Chief Complaint  Patient presents with   CHF   Lt side pain- groin to ear    Chest tightness    Samantha Luna is a 61 y.o. female who presents with conern for pain in the left abdominal wall all the way up through the left shoulder that started in the past week. She does water aerobics multiple times a week and did feel as though she overdid it at water aerobics at the end of last week.  Since that time she also has moved residences.  Endorses pain with movement in the left shoulder and soreness with movement in the left ribs and abdomen but no other abdominal pain nausea vomiting or diarrhea.  No chest pain shortness of breath or palpitations.  No numbness tingling or weakness in the arms, no pain in the legs.  Tylenol at home with some improvement.   Bilaterally.  I personally reviewed medical records previous history of COPD, or CPAP, history of CHF, obesity, hyperlipidemia and prediabetes.  She has reported achieved weight loss with increased activity over the last few months.  She is scheduled for total right knee replacement next week.  I personally reviewed her medical records.  She is not anticoagulated.  HPI     Home Medications Prior to Admission medications   Medication Sig Start Date End Date Taking? Authorizing Provider  methocarbamol (ROBAXIN) 500 MG tablet Take 1 tablet (500 mg total) by mouth 2 (two) times daily. 05/17/22  Yes Aara Jacquot R, PA-C  albuterol (VENTOLIN HFA) 108 (90 Base) MCG/ACT inhaler Inhale 1-2 puffs into the lungs every 6 (six) hours as needed for wheezing or shortness of breath.    [provider]  aspirin EC 81 MG tablet Take 81 mg by mouth daily. Swallow whole.    [provider]  diclofenac (VOLTAREN) 75 MG EC tablet Take 75 mg by mouth daily.    [provider]  escitalopram  (LEXAPRO) 10 MG tablet Take 10 mg by mouth daily.    [provider]  fluticasone (FLOVENT HFA) 220 MCG/ACT inhaler Inhale 2 puffs into the lungs 2 (two) times daily.    [provider]  ibandronate (BONIVA) 150 MG tablet Take 150 mg by mouth every 30 (thirty) days.    [provider]  lidocaine (LIDODERM) 5 % Place 1 patch onto the skin daily as needed (pain). Remove & Discard patch within 12 hours or as directed by MD    [provider]  loratadine (CLARITIN) 10 MG tablet Take 10 mg by mouth daily as needed for allergies.    [provider]  losartan-hydrochlorothiazide (HYZAAR) 100-25 MG tablet Take 1 tablet by mouth daily.    [provider]  Multiple Vitamins-Minerals (MULTIVITAMIN WITH MINERALS) tablet Take 1 tablet by mouth daily.    [provider]  MYRBETRIQ 50 MG TB24 tablet Take 50 mg by mouth every other day. 02/02/22   [provider]  nitroGLYCERIN (NITROSTAT) 0.4 MG SL tablet Place 0.4 mg under the tongue every 5 (five) minutes as needed for chest pain.    [provider]  omeprazole (PRILOSEC) 20 MG capsule Take 20 mg by mouth daily. 11/05/20   [provider]  rosuvastatin (CRESTOR) 5 MG tablet Take 5 mg by mouth daily.    [provider]  traMADol (ULTRAM) 50 MG tablet Take  1-2 tablets (50-100 mg total) by mouth every 6 (six) hours as needed for moderate pain. 10/04/21   Edmisten, Ok Anis, PA  Vitamin D, Ergocalciferol, (DRISDOL) 1.25 MG (50000 UNIT) CAPS capsule Take 50,000 Units by mouth every Sunday. 03/09/21   [provider]      Allergies    Iodine and Codeine    Review of Systems   Review of Systems  Constitutional: Negative.   HENT: Negative.    Respiratory: Negative.    Musculoskeletal:  Positive for myalgias.    Physical Exam Updated Vital Signs BP (!) 165/75 (BP Location: Right Arm)   Pulse 70   Temp 98.2 F (36.8 C) (Oral)   Resp 18   Ht '5\' 2"'$  (1.575  m)   Wt 92.1 kg   SpO2 97%   BMI 37.13 kg/m  Physical Exam Vitals and nursing note reviewed.  Constitutional:      Appearance: She is obese. She is not ill-appearing or toxic-appearing.  HENT:     Head: Normocephalic and atraumatic.     Mouth/Throat:     Mouth: Mucous membranes are moist.     Pharynx: No oropharyngeal exudate or posterior oropharyngeal erythema.  Eyes:     General:        Right eye: No discharge.        Left eye: No discharge.     Extraocular Movements: Extraocular movements intact.     Conjunctiva/sclera: Conjunctivae normal.     Pupils: Pupils are equal, round, and reactive to light.  Cardiovascular:     Rate and Rhythm: Normal rate and regular rhythm.     Pulses: Normal pulses.     Heart sounds: No murmur heard. Pulmonary:     Effort: Pulmonary effort is normal. No respiratory distress.     Breath sounds: Normal breath sounds. No wheezing or rales.  Abdominal:     General: Bowel sounds are normal. There is no distension.     Palpations: Abdomen is soft.     Tenderness: There is no abdominal tenderness. There is no right CVA tenderness, left CVA tenderness, guarding or rebound.  Musculoskeletal:        General: No deformity.     Right shoulder: Normal.     Left shoulder: Tenderness present. No swelling, deformity, effusion, bony tenderness or crepitus. Decreased range of motion. Normal strength.     Right upper arm: Normal.     Left upper arm: Normal.     Right elbow: Normal.     Left elbow: Normal.     Right forearm: Normal.     Left forearm: Normal.     Right wrist: Normal.     Left wrist: Normal.     Right hand: Normal.     Left hand: Normal.     Cervical back: Neck supple. Spasms and tenderness present. No bony tenderness. Normal range of motion.     Thoracic back: Spasms and tenderness present. No bony tenderness.     Lumbar back: Normal.     Comments: Tenderness palpation of the left trapezius with soft tissue swelling over the trapezius as  well.  Decreased range of motion of the left shoulder secondary to pain.  Full range of motion of the elbow and wrist actively and passively without discomfort.  Normal radial pulses and neurologic sensation in the hand.  Tenderness palpation over the medial deltoid on the left. Spasm and TTP on to cervical and thoracic paraspinous musculature without midline TTP or decreased ROM.  Skin:    General: Skin is warm and dry.     Capillary Refill: Capillary refill takes less than 2 seconds.  Neurological:     General: No focal deficit present.     Mental Status: She is alert and oriented to person, place, and time. Mental status is at baseline.     Sensory: Sensation is intact.     Motor: Motor function is intact.     Gait: Gait is intact.  Psychiatric:        Mood and Affect: Mood normal.     ED Results / Procedures / Treatments   Labs (all labs ordered are listed, but only abnormal results are displayed) Labs Reviewed  BASIC METABOLIC PANEL - Abnormal; Notable for the following components:      Result Value   Potassium 3.1 (*)    Glucose, Bld 130 (*)    All other components within normal limits  CBC  BRAIN NATRIURETIC PEPTIDE  TROPONIN I (HIGH SENSITIVITY)  TROPONIN I (HIGH SENSITIVITY)    EKG EKG Interpretation  Date/Time:  Wednesday May 17 2022 12:28:06 EDT Ventricular Rate:  81 PR Interval:  156 QRS Duration: 78 QT Interval:  356 QTC Calculation: 413 R Axis:   26 Text Interpretation: Normal sinus rhythm Possible Anterior infarct , age undetermined Abnormal ECG When compared with ECG of 05-Oct-2021 21:07, PREVIOUS ECG IS PRESENT when compared to prior, similar appearance. No STEMI Confirmed by Antony Blackbird [phone]) on 05/17/2022 8:33:51 PM  Radiology DG Chest 2 View  Result Date: 05/17/2022 CLINICAL DATA:  Left-sided chest tightness EXAM: CHEST - 2 VIEW COMPARISON:  10/05/2021 FINDINGS: Unchanged cardiac and mediastinal contours. No focal pulmonary opacity. No  pleural effusion or pneumothorax. No acute osseous abnormality. IMPRESSION: No acute cardiopulmonary process. Electronically Signed   By: Merilyn Baba M.D.   On: 05/17/2022 13:11    Procedures Procedures   Medications Ordered in ED Medications  potassium chloride SA (KLOR-CON M) CR tablet 40 mEq (has no administration in time range)  methocarbamol (ROBAXIN) tablet 500 mg (has no administration in time range)    ED Course/ Medical Decision Making/ A&P                           Medical Decision Making 62 year old with female with history of left arm pain.   VS normal on intake, cardiopulmonary exam is normal, abdominal exam is benign. MSK exam as above. Neurovascularly intact in all extremities.   DDX includes but is not limited to muscular strain, tendinous or ligamentous injury, acute fracture or dislocation, limb ischemia, cervical radiculopathy.   Amount and/or Complexity of Data Reviewed Labs: ordered.    Details: CBC without leukocytosis or anemia. BMP with mild hypokalemia 3.1 otherwise unremarkable.  Troponin negative x2, BNP unremarkable.   Radiology: ordered.    Details: Chest x-ray visualized with provider negative for acute cardiopulmonary disease.  No acute osseous abnormality.   ECG/medicine tests:     Details:  EKG with normal sinus rhythm without STEMI.  Risk Prescription drug management.   Clinical picture most consistent with muscle strain of the left shoulder and trapezius.  No evidence of acute osseous abnormality on x-ray or physical exam.  Patient neurovascular intact in the hand.  No further comport to be this time clinical concern for emergent underlying etiology would warrant further ED work-up or inpatient management is exceedingly low.  Will d/c with robaxin, recommendation for topical analgesia.  Sunday Spillers  voiced understanding of her medical evaluation and treatment plan. Each of their questions answered to their expressed satisfaction.  Return  precautions were given.  Patient is well-appearing, stable, and was discharged in good condition.   This chart was dictated using voice recognition software, Dragon. Despite the best efforts of this provider to proofread and correct errors, errors may still occur which can change documentation meaning.  Final Clinical Impression(s) / ED Diagnoses Final diagnoses:  Muscle strain of chest wall, initial encounter  Muscle strain of left shoulder region, initial encounter    Rx / DC Orders ED Discharge Orders          Ordered    methocarbamol (ROBAXIN) 500 MG tablet  2 times daily        05/17/22 2058              Bayley Yarborough, Gypsy Balsam, PA-C 05/17/22 2211    Tegeler, Gwenyth Allegra, MD 05/17/22 2225

## 2022-05-18 ENCOUNTER — Encounter: Payer: Self-pay | Admitting: Cardiology

## 2022-05-22 ENCOUNTER — Ambulatory Visit (HOSPITAL_BASED_OUTPATIENT_CLINIC_OR_DEPARTMENT_OTHER): Payer: Medicare Other | Admitting: Anesthesiology

## 2022-05-22 ENCOUNTER — Encounter (HOSPITAL_COMMUNITY): Payer: Self-pay | Admitting: Orthopedic Surgery

## 2022-05-22 ENCOUNTER — Encounter (HOSPITAL_COMMUNITY)
Admission: RE | Disposition: A | Discharge status: Home / Self Care | Payer: Self-pay | Source: Ambulatory Visit | Attending: Orthopedic Surgery

## 2022-05-22 ENCOUNTER — Ambulatory Visit (HOSPITAL_COMMUNITY): Payer: Medicare Other | Admitting: Physician Assistant

## 2022-05-22 ENCOUNTER — Other Ambulatory Visit: Payer: Self-pay

## 2022-05-22 ENCOUNTER — Observation Stay (HOSPITAL_COMMUNITY)
Admission: RE | Admit: 2022-05-22 | Discharge: 2022-05-23 | Disposition: A | Discharge status: Home / Self Care | Payer: Medicare Other | Source: Ambulatory Visit | Attending: Orthopedic Surgery | Admitting: Orthopedic Surgery

## 2022-05-22 DIAGNOSIS — Z01818 Encounter for other preprocedural examination: Secondary | ICD-10-CM

## 2022-05-22 DIAGNOSIS — I509 Heart failure, unspecified: Secondary | ICD-10-CM | POA: Diagnosis not present

## 2022-05-22 DIAGNOSIS — Z96652 Presence of left artificial knee joint: Secondary | ICD-10-CM | POA: Diagnosis not present

## 2022-05-22 DIAGNOSIS — I110 Hypertensive heart disease with heart failure: Secondary | ICD-10-CM | POA: Insufficient documentation

## 2022-05-22 DIAGNOSIS — M1711 Unilateral primary osteoarthritis, right knee: Secondary | ICD-10-CM | POA: Diagnosis present

## 2022-05-22 DIAGNOSIS — Z79899 Other long term (current) drug therapy: Secondary | ICD-10-CM | POA: Insufficient documentation

## 2022-05-22 DIAGNOSIS — Z87891 Personal history of nicotine dependence: Secondary | ICD-10-CM

## 2022-05-22 DIAGNOSIS — Z8616 Personal history of COVID-19: Secondary | ICD-10-CM | POA: Insufficient documentation

## 2022-05-22 DIAGNOSIS — Z7982 Long term (current) use of aspirin: Secondary | ICD-10-CM | POA: Insufficient documentation

## 2022-05-22 DIAGNOSIS — R7303 Prediabetes: Secondary | ICD-10-CM

## 2022-05-22 DIAGNOSIS — J45909 Unspecified asthma, uncomplicated: Secondary | ICD-10-CM | POA: Insufficient documentation

## 2022-05-22 DIAGNOSIS — J449 Chronic obstructive pulmonary disease, unspecified: Secondary | ICD-10-CM | POA: Insufficient documentation

## 2022-05-22 HISTORY — PX: TOTAL KNEE ARTHROPLASTY: SHX125

## 2022-05-22 SURGERY — ARTHROPLASTY, KNEE, TOTAL
Anesthesia: Regional | Site: Knee | Laterality: Right

## 2022-05-22 MED ORDER — OXYCODONE HCL 5 MG/5ML PO SOLN: 5.0000 mg | Freq: Once | ORAL | Status: DC | PRN

## 2022-05-22 MED ORDER — SODIUM CHLORIDE (PF) 0.9 % IJ SOLN
INTRAMUSCULAR | Status: DC | PRN
  Administered 2022-05-22: 60 mL

## 2022-05-22 MED ORDER — DEXAMETHASONE SODIUM PHOSPHATE 10 MG/ML IJ SOLN
10.0000 mg | Freq: Once | INTRAMUSCULAR | Status: AC
  Administered 2022-05-23: 10 mg via INTRAVENOUS
  Filled 2022-05-22: qty 1

## 2022-05-22 MED ORDER — PROPOFOL 10 MG/ML IV BOLUS
INTRAVENOUS | Status: AC
  Filled 2022-05-22: qty 20

## 2022-05-22 MED ORDER — FENTANYL CITRATE PF 50 MCG/ML IJ SOSY
50.0000 ug | PREFILLED_SYRINGE | INTRAMUSCULAR | Status: AC
  Administered 2022-05-22 (×2): 50 ug via INTRAVENOUS
  Filled 2022-05-22: qty 2

## 2022-05-22 MED ORDER — BUPIVACAINE LIPOSOME 1.3 % IJ SUSP
INTRAMUSCULAR | Status: DC | PRN
  Administered 2022-05-22: 20 mL

## 2022-05-22 MED ORDER — DEXAMETHASONE SODIUM PHOSPHATE 10 MG/ML IJ SOLN: 8.0000 mg | Freq: Once | INTRAMUSCULAR | Status: DC

## 2022-05-22 MED ORDER — LOSARTAN POTASSIUM 50 MG PO TABS
100.0000 mg | ORAL_TABLET | Freq: Every day | ORAL | Status: DC
  Administered 2022-05-23: 100 mg via ORAL
  Filled 2022-05-22: qty 2

## 2022-05-22 MED ORDER — BISACODYL 10 MG RE SUPP: 10.0000 mg | Freq: Every day | RECTAL | Status: DC | PRN

## 2022-05-22 MED ORDER — CEFAZOLIN SODIUM-DEXTROSE 2-4 GM/100ML-% IV SOLN
2.0000 g | INTRAVENOUS | Status: AC
  Administered 2022-05-22: 2 g via INTRAVENOUS
  Filled 2022-05-22: qty 100

## 2022-05-22 MED ORDER — MIDAZOLAM HCL 2 MG/2ML IJ SOLN
1.0000 mg | INTRAMUSCULAR | Status: AC
  Administered 2022-05-22: 2 mg via INTRAVENOUS
  Filled 2022-05-22: qty 2

## 2022-05-22 MED ORDER — GABAPENTIN 300 MG PO CAPS
300.0000 mg | ORAL_CAPSULE | Freq: Three times a day (TID) | ORAL | Status: DC
  Administered 2022-05-22 – 2022-05-23 (×4): 300 mg via ORAL
  Filled 2022-05-22 (×4): qty 1

## 2022-05-22 MED ORDER — ROSUVASTATIN CALCIUM 5 MG PO TABS
5.0000 mg | ORAL_TABLET | Freq: Every day | ORAL | Status: DC
  Administered 2022-05-23: 5 mg via ORAL
  Filled 2022-05-22: qty 1

## 2022-05-22 MED ORDER — BUPIVACAINE IN DEXTROSE 0.75-8.25 % IT SOLN
INTRATHECAL | Status: DC | PRN
  Administered 2022-05-22: 1.8 mL via INTRATHECAL

## 2022-05-22 MED ORDER — POLYETHYLENE GLYCOL 3350 17 G PO PACK: 17.0000 g | PACK | Freq: Every day | ORAL | Status: DC | PRN

## 2022-05-22 MED ORDER — LIDOCAINE 2% (20 MG/ML) 5 ML SYRINGE
INTRAMUSCULAR | Status: DC | PRN
  Administered 2022-05-22: 100 mg via INTRAVENOUS

## 2022-05-22 MED ORDER — ACETAMINOPHEN 10 MG/ML IV SOLN
1000.0000 mg | Freq: Four times a day (QID) | INTRAVENOUS | Status: DC
  Administered 2022-05-22: 1000 mg via INTRAVENOUS
  Filled 2022-05-22: qty 100

## 2022-05-22 MED ORDER — SODIUM CHLORIDE 0.9 % IR SOLN
Status: DC | PRN
  Administered 2022-05-22 (×2): 1000 mL

## 2022-05-22 MED ORDER — POVIDONE-IODINE 10 % EX SWAB
2.0000 | Freq: Once | CUTANEOUS | Status: AC
  Administered 2022-05-22: 2 via TOPICAL

## 2022-05-22 MED ORDER — ALBUTEROL SULFATE (2.5 MG/3ML) 0.083% IN NEBU: 2.5000 mg | INHALATION_SOLUTION | Freq: Four times a day (QID) | RESPIRATORY_TRACT | Status: DC | PRN

## 2022-05-22 MED ORDER — PROPOFOL 1000 MG/100ML IV EMUL
INTRAVENOUS | Status: AC
  Filled 2022-05-22: qty 100

## 2022-05-22 MED ORDER — PHENOL 1.4 % MT LIQD: 1.0000 | OROMUCOSAL | Status: DC | PRN

## 2022-05-22 MED ORDER — DEXAMETHASONE SODIUM PHOSPHATE 10 MG/ML IJ SOLN
INTRAMUSCULAR | Status: DC | PRN
  Administered 2022-05-22: 10 mg via INTRAVENOUS

## 2022-05-22 MED ORDER — ESCITALOPRAM OXALATE 10 MG PO TABS
10.0000 mg | ORAL_TABLET | Freq: Every day | ORAL | Status: DC
  Administered 2022-05-23: 10 mg via ORAL
  Filled 2022-05-22: qty 1

## 2022-05-22 MED ORDER — LORATADINE 10 MG PO TABS: 10.0000 mg | ORAL_TABLET | Freq: Every day | ORAL | Status: DC | PRN

## 2022-05-22 MED ORDER — LACTATED RINGERS IV SOLN: INTRAVENOUS | Status: DC

## 2022-05-22 MED ORDER — CEFAZOLIN SODIUM-DEXTROSE 2-4 GM/100ML-% IV SOLN
2.0000 g | Freq: Four times a day (QID) | INTRAVENOUS | Status: AC
  Administered 2022-05-22 (×2): 2 g via INTRAVENOUS
  Filled 2022-05-22 (×2): qty 100

## 2022-05-22 MED ORDER — BUPIVACAINE LIPOSOME 1.3 % IJ SUSP: 20.0000 mL | Freq: Once | INTRAMUSCULAR | Status: DC

## 2022-05-22 MED ORDER — SODIUM CHLORIDE 0.9 % IV SOLN: INTRAVENOUS | Status: DC

## 2022-05-22 MED ORDER — MENTHOL 3 MG MT LOZG: 1.0000 | LOZENGE | OROMUCOSAL | Status: DC | PRN

## 2022-05-22 MED ORDER — ONDANSETRON HCL 4 MG/2ML IJ SOLN: 4.0000 mg | Freq: Four times a day (QID) | INTRAMUSCULAR | Status: DC | PRN

## 2022-05-22 MED ORDER — HYDROCHLOROTHIAZIDE 25 MG PO TABS
25.0000 mg | ORAL_TABLET | Freq: Every day | ORAL | Status: DC
  Administered 2022-05-23: 25 mg via ORAL
  Filled 2022-05-22: qty 1

## 2022-05-22 MED ORDER — METHOCARBAMOL 500 MG PO TABS
500.0000 mg | ORAL_TABLET | Freq: Four times a day (QID) | ORAL | Status: DC | PRN
  Administered 2022-05-22 – 2022-05-23 (×3): 500 mg via ORAL
  Filled 2022-05-22 (×2): qty 1

## 2022-05-22 MED ORDER — PANTOPRAZOLE SODIUM 40 MG PO TBEC
40.0000 mg | DELAYED_RELEASE_TABLET | Freq: Every day | ORAL | Status: DC
  Administered 2022-05-23: 40 mg via ORAL
  Filled 2022-05-22: qty 1

## 2022-05-22 MED ORDER — METOCLOPRAMIDE HCL 5 MG PO TABS: 5.0000 mg | ORAL_TABLET | Freq: Three times a day (TID) | ORAL | Status: DC | PRN

## 2022-05-22 MED ORDER — ACETAMINOPHEN 500 MG PO TABS
1000.0000 mg | ORAL_TABLET | Freq: Four times a day (QID) | ORAL | Status: AC
  Administered 2022-05-22 – 2022-05-23 (×4): 1000 mg via ORAL
  Filled 2022-05-22 (×4): qty 2

## 2022-05-22 MED ORDER — LOSARTAN POTASSIUM-HCTZ 100-25 MG PO TABS: 1.0000 | ORAL_TABLET | Freq: Every day | ORAL | Status: DC

## 2022-05-22 MED ORDER — BUPIVACAINE LIPOSOME 1.3 % IJ SUSP
INTRAMUSCULAR | Status: AC
  Filled 2022-05-22: qty 20

## 2022-05-22 MED ORDER — NITROGLYCERIN 0.4 MG SL SUBL: 0.4000 mg | SUBLINGUAL_TABLET | SUBLINGUAL | Status: DC | PRN

## 2022-05-22 MED ORDER — TRAMADOL HCL 50 MG PO TABS
50.0000 mg | ORAL_TABLET | Freq: Four times a day (QID) | ORAL | Status: DC | PRN
  Administered 2022-05-23: 50 mg via ORAL
  Filled 2022-05-22: qty 1

## 2022-05-22 MED ORDER — DIPHENHYDRAMINE HCL 12.5 MG/5ML PO ELIX: 12.5000 mg | ORAL_SOLUTION | ORAL | Status: DC | PRN

## 2022-05-22 MED ORDER — PROMETHAZINE HCL 25 MG/ML IJ SOLN: 6.2500 mg | INTRAMUSCULAR | Status: DC | PRN

## 2022-05-22 MED ORDER — SODIUM CHLORIDE (PF) 0.9 % IJ SOLN
INTRAMUSCULAR | Status: AC
  Filled 2022-05-22: qty 10

## 2022-05-22 MED ORDER — DEXAMETHASONE SODIUM PHOSPHATE 10 MG/ML IJ SOLN
INTRAMUSCULAR | Status: AC
  Filled 2022-05-22: qty 1

## 2022-05-22 MED ORDER — ONDANSETRON HCL 4 MG/2ML IJ SOLN
INTRAMUSCULAR | Status: DC | PRN
  Administered 2022-05-22: 4 mg via INTRAVENOUS

## 2022-05-22 MED ORDER — PHENYLEPHRINE HCL-NACL 20-0.9 MG/250ML-% IV SOLN
INTRAVENOUS | Status: AC
  Filled 2022-05-22: qty 250

## 2022-05-22 MED ORDER — LIDOCAINE HCL (PF) 2 % IJ SOLN
INTRAMUSCULAR | Status: AC
  Filled 2022-05-22: qty 5

## 2022-05-22 MED ORDER — PHENYLEPHRINE HCL-NACL 20-0.9 MG/250ML-% IV SOLN
INTRAVENOUS | Status: DC | PRN
  Administered 2022-05-22: 50 ug/min via INTRAVENOUS

## 2022-05-22 MED ORDER — TRANEXAMIC ACID-NACL 1000-0.7 MG/100ML-% IV SOLN
1000.0000 mg | INTRAVENOUS | Status: AC
  Administered 2022-05-22: 1000 mg via INTRAVENOUS
  Filled 2022-05-22: qty 100

## 2022-05-22 MED ORDER — METOCLOPRAMIDE HCL 5 MG/ML IJ SOLN: 5.0000 mg | Freq: Three times a day (TID) | INTRAMUSCULAR | Status: DC | PRN

## 2022-05-22 MED ORDER — HYDROMORPHONE HCL 1 MG/ML IJ SOLN: 0.5000 mg | INTRAMUSCULAR | Status: DC | PRN

## 2022-05-22 MED ORDER — BUDESONIDE 0.5 MG/2ML IN SUSP
1.0000 mg | Freq: Two times a day (BID) | RESPIRATORY_TRACT | Status: DC
  Administered 2022-05-23: 1 mg via RESPIRATORY_TRACT
  Filled 2022-05-22: qty 4

## 2022-05-22 MED ORDER — ROPIVACAINE HCL 5 MG/ML IJ SOLN
INTRAMUSCULAR | Status: DC | PRN
  Administered 2022-05-22: 20 mL via PERINEURAL

## 2022-05-22 MED ORDER — PROPOFOL 10 MG/ML IV BOLUS
INTRAVENOUS | Status: DC | PRN
  Administered 2022-05-22 (×2): 20 mg via INTRAVENOUS

## 2022-05-22 MED ORDER — PROPOFOL 500 MG/50ML IV EMUL
INTRAVENOUS | Status: DC | PRN
  Administered 2022-05-22: 50 ug/kg/min via INTRAVENOUS

## 2022-05-22 MED ORDER — HYDROMORPHONE HCL 2 MG PO TABS
2.0000 mg | ORAL_TABLET | ORAL | Status: DC | PRN
  Administered 2022-05-22 – 2022-05-23 (×4): 2 mg via ORAL
  Administered 2022-05-23: 4 mg via ORAL
  Filled 2022-05-22 (×6): qty 1

## 2022-05-22 MED ORDER — MIRABEGRON ER 25 MG PO TB24: 50.0000 mg | ORAL_TABLET | ORAL | Status: DC

## 2022-05-22 MED ORDER — CHLORHEXIDINE GLUCONATE 0.12 % MT SOLN
15.0000 mL | Freq: Once | OROMUCOSAL | Status: AC
  Administered 2022-05-22: 15 mL via OROMUCOSAL

## 2022-05-22 MED ORDER — ASPIRIN 325 MG PO TBEC
325.0000 mg | DELAYED_RELEASE_TABLET | Freq: Two times a day (BID) | ORAL | Status: DC
  Administered 2022-05-23: 325 mg via ORAL
  Filled 2022-05-22: qty 1

## 2022-05-22 MED ORDER — FLEET ENEMA 7-19 GM/118ML RE ENEM: 1.0000 | ENEMA | Freq: Once | RECTAL | Status: DC | PRN

## 2022-05-22 MED ORDER — SODIUM CHLORIDE (PF) 0.9 % IJ SOLN
INTRAMUSCULAR | Status: AC
  Filled 2022-05-22: qty 50

## 2022-05-22 MED ORDER — ORAL CARE MOUTH RINSE: 15.0000 mL | Freq: Once | OROMUCOSAL | Status: AC

## 2022-05-22 MED ORDER — ONDANSETRON HCL 4 MG/2ML IJ SOLN
INTRAMUSCULAR | Status: AC
  Filled 2022-05-22: qty 2

## 2022-05-22 MED ORDER — ONDANSETRON HCL 4 MG PO TABS: 4.0000 mg | ORAL_TABLET | Freq: Four times a day (QID) | ORAL | Status: DC | PRN

## 2022-05-22 MED ORDER — HYDROMORPHONE HCL 1 MG/ML IJ SOLN: 0.2500 mg | INTRAMUSCULAR | Status: DC | PRN

## 2022-05-22 MED ORDER — STERILE WATER FOR IRRIGATION IR SOLN
Status: DC | PRN
  Administered 2022-05-22: 2000 mL

## 2022-05-22 MED ORDER — DOCUSATE SODIUM 100 MG PO CAPS
100.0000 mg | ORAL_CAPSULE | Freq: Two times a day (BID) | ORAL | Status: DC
  Administered 2022-05-22 – 2022-05-23 (×2): 100 mg via ORAL
  Filled 2022-05-22 (×2): qty 1

## 2022-05-22 MED ORDER — OXYCODONE HCL 5 MG PO TABS: 5.0000 mg | ORAL_TABLET | Freq: Once | ORAL | Status: DC | PRN

## 2022-05-22 MED ORDER — METHOCARBAMOL 1000 MG/10ML IJ SOLN: 500.0000 mg | Freq: Four times a day (QID) | INTRAVENOUS | Status: DC | PRN

## 2022-05-22 SURGICAL SUPPLY — 52 items
ATTUNE PS FEM RT SZ 5 CEM KNEE (Femur) IMPLANT
ATTUNE PSRP INSR SZ5 8 KNEE (Insert) IMPLANT
BAG COUNTER SPONGE SURGICOUNT (BAG) IMPLANT
BAG ZIPLOCK 12X15 (MISCELLANEOUS) ×1 IMPLANT
BASEPLATE TIBIAL ROTATING SZ 4 (Knees) IMPLANT
BLADE SAG 18X100X1.27 (BLADE) ×1 IMPLANT
BLADE SAW SGTL 11.0X1.19X90.0M (BLADE) ×1 IMPLANT
BNDG ELASTIC 6X5.8 VLCR STR LF (GAUZE/BANDAGES/DRESSINGS) ×1 IMPLANT
BOWL SMART MIX CTS (DISPOSABLE) ×1 IMPLANT
CEMENT HV SMART SET (Cement) ×2 IMPLANT
CLSR STERI-STRIP ANTIMIC 1/2X4 (GAUZE/BANDAGES/DRESSINGS) IMPLANT
COVER SURGICAL LIGHT HANDLE (MISCELLANEOUS) ×1 IMPLANT
CUFF TOURN SGL QUICK 34 (TOURNIQUET CUFF) ×1
CUFF TRNQT CYL 34X4.125X (TOURNIQUET CUFF) ×1 IMPLANT
DRAPE INCISE IOBAN 66X45 STRL (DRAPES) ×1 IMPLANT
DRAPE U-SHAPE 47X51 STRL (DRAPES) ×1 IMPLANT
DRSG AQUACEL AG ADV 3.5X10 (GAUZE/BANDAGES/DRESSINGS) ×1 IMPLANT
DURAPREP 26ML APPLICATOR (WOUND CARE) ×1 IMPLANT
ELECT REM PT RETURN 15FT ADLT (MISCELLANEOUS) ×1 IMPLANT
GLOVE BIO SURGEON STRL SZ 6.5 (GLOVE) IMPLANT
GLOVE BIO SURGEON STRL SZ7.5 (GLOVE) IMPLANT
GLOVE BIO SURGEON STRL SZ8 (GLOVE) ×1 IMPLANT
GLOVE BIOGEL PI IND STRL 6.5 (GLOVE) IMPLANT
GLOVE BIOGEL PI IND STRL 7.0 (GLOVE) IMPLANT
GLOVE BIOGEL PI IND STRL 8 (GLOVE) ×1 IMPLANT
GOWN STRL REUS W/ TWL LRG LVL3 (GOWN DISPOSABLE) ×1 IMPLANT
GOWN STRL REUS W/ TWL XL LVL3 (GOWN DISPOSABLE) IMPLANT
GOWN STRL REUS W/TWL LRG LVL3 (GOWN DISPOSABLE) ×1
GOWN STRL REUS W/TWL XL LVL3 (GOWN DISPOSABLE) ×3
HANDPIECE INTERPULSE COAX TIP (DISPOSABLE) ×1
HOLDER FOLEY CATH W/STRAP (MISCELLANEOUS) IMPLANT
IMMOBILIZER KNEE 20 (SOFTGOODS) ×1
IMMOBILIZER KNEE 20 THIGH 36 (SOFTGOODS) ×1 IMPLANT
KIT TURNOVER KIT A (KITS) IMPLANT
MANIFOLD NEPTUNE II (INSTRUMENTS) ×1 IMPLANT
NS IRRIG 1000ML POUR BTL (IV SOLUTION) ×1 IMPLANT
PACK TOTAL KNEE CUSTOM (KITS) ×1 IMPLANT
PADDING CAST COTTON 6X4 STRL (CAST SUPPLIES) ×2 IMPLANT
PATELLA MEDIAL ATTUN 35MM KNEE (Knees) IMPLANT
PROTECTOR NERVE ULNAR (MISCELLANEOUS) ×1 IMPLANT
SET HNDPC FAN SPRY TIP SCT (DISPOSABLE) ×1 IMPLANT
SPIKE FLUID TRANSFER (MISCELLANEOUS) ×1 IMPLANT
STRIP CLOSURE SKIN 1/2X4 (GAUZE/BANDAGES/DRESSINGS) ×2 IMPLANT
SUT MNCRL AB 4-0 PS2 18 (SUTURE) ×1 IMPLANT
SUT STRATAFIX 0 PDS 27 VIOLET (SUTURE) ×1
SUT VIC AB 2-0 CT1 27 (SUTURE) ×3
SUT VIC AB 2-0 CT1 TAPERPNT 27 (SUTURE) ×3 IMPLANT
SUTURE STRATFX 0 PDS 27 VIOLET (SUTURE) ×1 IMPLANT
TRAY FOLEY MTR SLVR 16FR STAT (SET/KITS/TRAYS/PACK) ×1 IMPLANT
TUBE SUCTION HIGH CAP CLEAR NV (SUCTIONS) ×1 IMPLANT
WATER STERILE IRR 1000ML POUR (IV SOLUTION) ×2 IMPLANT
WRAP KNEE MAXI GEL POST OP (GAUZE/BANDAGES/DRESSINGS) ×1 IMPLANT

## 2022-05-22 NOTE — Evaluation (Signed)
Physical Therapy Evaluation Patient Details Name: Samantha Luna MRN: [id number] DOB: 1960-10-03 Today's Date: 05/22/2022  History of Present Illness  61 yo female s/p R TKA 9-25/23. PMH: Bell's palsy, meningioma, CHF, PVD, CHF, MDD, COPD, PVD, HTN  Clinical Impression  Pt is s/p TKA resulting in the deficits listed below (see PT Problem List).  Pt min/mod assist for sit to stand transfer, pt with anterior wt shift in standing (likely d/t residual effects of anesthesia --numbness in gluts 5 hrs post spinal end time) Assisted pt with lateral scooting bed to chair as she was very motivated to be OOB.  Anticipate steady progress. HEP initiated.   Pt will benefit from skilled PT to increase their independence and safety with mobility to allow discharge to the venue listed below.         Recommendations for follow up therapy are one component of a multi-disciplinary discharge planning process, led by the attending physician.  Recommendations may be updated based on patient status, additional functional criteria and insurance authorization.  Follow Up Recommendations Follow physician's recommendations for discharge plan and follow up therapies      Assistance Recommended at Discharge Intermittent Supervision/Assistance  Patient can return home with the following  Help with stairs or ramp for entrance;Assist for transportation;Assistance with cooking/housework    Equipment Recommendations None recommended by PT  Recommendations for Other Services       Functional Status Assessment Patient has had a recent decline in their functional status and demonstrates the ability to make significant improvements in function in a reasonable and predictable amount of time.     Precautions / Restrictions Precautions Precautions: Fall;Knee Precaution Comments: IND SLR, KI used d/t residual numbness gluts Required Braces or Orthoses: Knee Immobilizer - Right Knee Immobilizer - Right: Discontinue once  straight leg raise with < 10 degree lag Restrictions Weight Bearing Restrictions: No RLE Weight Bearing: Weight bearing as tolerated      Mobility  Bed Mobility Overal bed mobility: Needs Assistance Bed Mobility: Supine to Sit     Supine to sit: Min guard     General bed mobility comments: for safety    Transfers Overall transfer level: Needs assistance Equipment used: Rolling walker (2 wheels) Transfers: Sit to/from Stand, Bed to chair/wheelchair/BSC Sit to Stand: Mod assist, Min assist          Lateral/Scoot Transfers: Min assist, Min guard General transfer comment: cues for hand placement, pt with significant anterior lean on standing, gluts slightly numb--unchanged with position change. had pt return to sitting.  mild dizziness with BP 160/55. pt motivated to be OOB, assisted with lateral scoot transfer bed to chair    Ambulation/Gait               General Gait Details: deferred d/t residual effects of anesthesia  Stairs            Wheelchair Mobility    Modified Rankin (Stroke Patients Only)       Balance                                             Pertinent Vitals/Pain Pain Assessment Pain Assessment: 0-10 Pain Score: 2  Pain Location: right knee Pain Descriptors / Indicators: Discomfort Pain Intervention(s): Limited activity within patient's tolerance, Monitored during session, Premedicated before session, Repositioned    Home Living Family/patient expects to be discharged to:: Private  residence Living Arrangements: Alone Available Help at Discharge: Family;Available 24 hours/day Type of Home:  (townhouse) Home Access: Stairs to enter   [employer] of Steps: threshold   Home Layout: Able to live on main level with bedroom/bathroom Home Equipment: Conservation officer, nature (2 wheels);Cane - single point;BSC/3in1      Prior Function                       Hand Dominance        Extremity/Trunk  Assessment   Upper Extremity Assessment Upper Extremity Assessment: Overall WFL for tasks assessed    Lower Extremity Assessment Lower Extremity Assessment: RLE deficits/detail RLE Deficits / Details: ankle grossly WFL, knee extension and hip flexion 2+/5       Communication   Communication: No difficulties  Cognition Arousal/Alertness: Awake/alert Behavior During Therapy: WFL for tasks assessed/performed Overall Cognitive Status: Within Functional Limits for tasks assessed                                          General Comments      Exercises Total Joint Exercises Ankle Circles/Pumps: AROM, Both, 10 reps Quad Sets: AROM, 5 reps, Right   Assessment/Plan    PT Assessment Patient needs continued PT services  PT Problem List Decreased strength;Decreased mobility;Decreased range of motion;Decreased activity tolerance;Decreased balance;Decreased knowledge of use of DME;Pain       PT Treatment Interventions DME instruction;Therapeutic exercise;Gait training;Functional mobility training;Therapeutic activities;Patient/family education    PT Goals (Current goals can be found in the Care Plan section)  Acute Rehab PT Goals Patient Stated Goal: get better, have less pain PT Goal Formulation: With patient Time For Goal Achievement: 06/05/22 Potential to Achieve Goals: Good    Frequency 7X/week     Co-evaluation               AM-PAC PT "6 Clicks" Mobility  Outcome Measure Help needed turning from your back to your side while in a flat bed without using bedrails?: A Little Help needed moving from lying on your back to sitting on the side of a flat bed without using bedrails?: A Little Help needed moving to and from a bed to a chair (including a wheelchair)?: A Little Help needed standing up from a chair using your arms (e.g., wheelchair or bedside chair)?: A Little Help needed to walk in hospital room?: A Little Help needed climbing 3-5 steps with a  railing? : A Little 6 Click Score: 18    End of Session Equipment Utilized During Treatment: Gait belt Activity Tolerance: Patient tolerated treatment well Patient left: with call bell/phone within reach;in chair;with chair alarm set;with family/visitor present Nurse Communication: Mobility status PT Visit Diagnosis: Other abnormalities of gait and mobility (R26.89);Difficulty in walking, not elsewhere classified (R26.2)    Time: 1615 ([phone])-[id number] (1501) PT Time Calculation (min) (ACUTE ONLY): 28 min   Charges:   PT Evaluation $PT Eval Low Complexity: 1 Low PT Treatments $Therapeutic Activity: 8-22 mins        Baxter Flattery, PT  Acute Rehab Dept [facility]) [phone]  [facility] Weekend Pager [facility] only)  [phone]  05/22/2022   [facility] 05/22/2022, 5:33 PM

## 2022-05-22 NOTE — Plan of Care (Signed)
  Problem: Education: Goal: Knowledge of the prescribed therapeutic regimen will improve Outcome: Progressing   Problem: Pain Management: Goal: Pain level will decrease with appropriate interventions Outcome: Progressing   

## 2022-05-22 NOTE — Progress Notes (Signed)
Orthopedic Tech Progress Note Patient Details:  Samantha Luna 1960/10/11 [id number] Patient was taken out of [facility] for PT  Patient ID: Samantha Luna, female   DOB: 1960/09/08, 61 y.o.   MRN: [id number]  Jearld Lesch 05/22/2022, 3:18 PM

## 2022-05-22 NOTE — Anesthesia Procedure Notes (Signed)
Spinal  Patient location during procedure: OR Start time: 05/22/2022 10:41 AM End time: 05/22/2022 10:46 AM Reason for block: surgical anesthesia Staffing Performed: anesthesiologist  Anesthesiologist: Lynda Rainwater, MD Performed by: Lynda Rainwater, MD Authorized by: Lynda Rainwater, MD   Preanesthetic Checklist Completed: patient identified, IV checked, site marked, risks and benefits discussed, surgical consent, monitors and equipment checked, pre-op evaluation and timeout performed Spinal Block Patient position: sitting Prep: DuraPrep Patient monitoring: heart rate, cardiac monitor, continuous pulse ox and blood pressure Approach: midline Location: L3-4 Injection technique: single-shot Needle Needle type: Quincke  Needle gauge: 22 G Needle length: 9 cm Assessment Sensory level: T4 Events: CSF return and second provider Additional Notes First attempt by CRNA with 24g Pencan unsuccessful.  Anesthesiologist successful with 22g Quincke

## 2022-05-22 NOTE — Discharge Instructions (Signed)
Samantha Arabian, MD Total Joint Specialist [facility] [address] [place], [place] [address] [phone]  TOTAL KNEE REPLACEMENT POSTOPERATIVE DIRECTIONS    Knee Rehabilitation, Guidelines Following Surgery  Results after knee surgery are often greatly improved when you follow the exercise, range of motion and muscle strengthening exercises prescribed by your doctor. Safety measures are also important to protect the knee from further injury. If any of these exercises cause you to have increased pain or swelling in your knee joint, decrease the amount until you are comfortable again and slowly increase them. If you have problems or questions, call your caregiver or physical therapist for advice.   BLOOD CLOT PREVENTION Take a 325 mg Aspirin two times a day for three weeks following surgery. Then resume one 81 mg Aspirin once a day. You may resume your vitamins/supplements upon discharge from the hospital. Do not take any NSAIDs (Advil, Aleve, Ibuprofen, Meloxicam, etc.) until you have discontinued the 325 mg Aspirin.  GABAPENTIN INSTRUCTIONS Take a 300 mg capsule three times a day for two weeks following surgery.Then take a 300 mg capsule two times a day for two weeks. Then take a 300 mg capsule once a day for two weeks. Then discontinue.  HOME CARE INSTRUCTIONS  Remove items at home which could result in a fall. This includes throw rugs or furniture in walking pathways.  ICE to the affected knee as much as tolerated. Icing helps control swelling. If the swelling is well controlled you will be more comfortable and rehab easier. Continue to use ice on the knee for pain and swelling from surgery. You may notice swelling that will progress down to the foot and ankle. This is normal after surgery. Elevate the leg when you are not up walking on it.    Continue to use the breathing machine which will help keep your temperature down. It is common for your  temperature to cycle up and down following surgery, especially at night when you are not up moving around and exerting yourself. The breathing machine keeps your lungs expanded and your temperature down. Do not place pillow under the operative knee, focus on keeping the knee straight while resting  DIET You may resume your previous home diet once you are discharged from the hospital.  DRESSING / WOUND CARE / SHOWERING Keep your bulky bandage on for 2 days. On the third post-operative day you may remove the Ace bandage and gauze. There is a waterproof adhesive bandage on your skin which will stay in place until your first follow-up appointment. Once you remove this you will not need to place another bandage You may begin showering 3 days following surgery, but do not submerge the incision under water.  ACTIVITY For the first 5 days, the key is rest and control of pain and swelling Do your home exercises twice a day starting on post-operative day 3. On the days you go to physical therapy, just do the home exercises once that day. You should rest, ice and elevate the leg for 50 minutes out of every hour. Get up and walk/stretch for 10 minutes per hour. After 5 days you can increase your activity slowly as tolerated. Walk with your walker as instructed. Use the walker until you are comfortable transitioning to a cane. Walk with the cane in the opposite hand of the operative leg. You may discontinue the cane once you are comfortable and walking steadily. Avoid periods of inactivity such as sitting longer than an hour  when not asleep. This helps prevent blood clots.  You may discontinue the knee immobilizer once you are able to perform a straight leg raise while lying down. You may resume a sexual relationship in one month or when given the OK by your doctor.  You may return to work once you are cleared by your doctor.  Do not drive a car for 6 weeks or until released by your surgeon.  Do not drive  while taking narcotics.  TED HOSE STOCKINGS Wear the elastic stockings on both legs for three weeks following surgery during the day. You may remove them at night for sleeping.  WEIGHT BEARING Weight bearing as tolerated with assist device (walker, cane, etc) as directed, use it as long as suggested by your surgeon or therapist, typically at least 4-6 weeks.  POSTOPERATIVE CONSTIPATION PROTOCOL Constipation - defined medically as fewer than three stools per week and severe constipation as less than one stool per week.  One of the most common issues patients have following surgery is constipation.  Even if you have a regular bowel pattern at home, your normal regimen is likely to be disrupted due to multiple reasons following surgery.  Combination of anesthesia, postoperative narcotics, change in appetite and fluid intake all can affect your bowels.  In order to avoid complications following surgery, here are some recommendations in order to help you during your recovery period.  Colace (docusate) - Pick up an over-the-counter form of Colace or another stool softener and take twice a day as long as you are requiring postoperative pain medications.  Take with a full glass of water daily.  If you experience loose stools or diarrhea, hold the colace until you stool forms back up. If your symptoms do not get better within 1 week or if they get worse, check with your doctor. Dulcolax (bisacodyl) - Pick up over-the-counter and take as directed by the product packaging as needed to assist with the movement of your bowels.  Take with a full glass of water.  Use this product as needed if not relieved by Colace only.  MiraLax (polyethylene glycol) - Pick up over-the-counter to have on hand. MiraLax is a solution that will increase the amount of water in your bowels to assist with bowel movements.  Take as directed and can mix with a glass of water, juice, soda, coffee, or tea. Take if you go more than two days  without a movement. Do not use MiraLax more than once per day. Call your doctor if you are still constipated or irregular after using this medication for 7 days in a row.  If you continue to have problems with postoperative constipation, please contact the office for further assistance and recommendations.  If you experience "the worst abdominal pain ever" or develop nausea or vomiting, please contact the office immediatly for further recommendations for treatment.  ITCHING If you experience itching with your medications, try taking only a single pain pill, or even half a pain pill at a time.  You can also use Benadryl over the counter for itching or also to help with sleep.   MEDICATIONS See your medication summary on the "After Visit Summary" that the nursing staff will review with you prior to discharge.  You may have some home medications which will be placed on hold until you complete the course of blood thinner medication.  It is important for you to complete the blood thinner medication as prescribed by your surgeon.  Continue your approved medications as  instructed at time of discharge.  PRECAUTIONS If you experience chest pain or shortness of breath - call 911 immediately for transfer to the hospital emergency department.  If you develop a fever greater that 101 F, purulent drainage from wound, increased redness or drainage from wound, foul odor from the wound/dressing, or calf pain - CONTACT YOUR SURGEON.                                                   FOLLOW-UP APPOINTMENTS Make sure you keep all of your appointments after your operation with your surgeon and caregivers. You should call the office at the above phone number and make an appointment for approximately two weeks after the date of your surgery or on the date instructed by your surgeon outlined in the "After Visit Summary".  RANGE OF MOTION AND STRENGTHENING EXERCISES  Rehabilitation of the knee is important following a knee  injury or an operation. After just a few days of immobilization, the muscles of the thigh which control the knee become weakened and shrink (atrophy). Knee exercises are designed to build up the tone and strength of the thigh muscles and to improve knee motion. Often times heat used for twenty to thirty minutes before working out will loosen up your tissues and help with improving the range of motion but do not use heat for the first two weeks following surgery. These exercises can be done on a training (exercise) mat, on the floor, on a table or on a bed. Use what ever works the best and is most comfortable for you Knee exercises include:  Leg Lifts - While your knee is still immobilized in a splint or cast, you can do straight leg raises. Lift the leg to 60 degrees, hold for 3 sec, and slowly lower the leg. Repeat 10-20 times 2-3 times daily. Perform this exercise against resistance later as your knee gets better.  Quad and Hamstring Sets - Tighten up the muscle on the front of the thigh (Quad) and hold for 5-10 sec. Repeat this 10-20 times hourly. Hamstring sets are done by pushing the foot backward against an object and holding for 5-10 sec. Repeat as with quad sets.  Leg Slides: Lying on your back, slowly slide your foot toward your buttocks, bending your knee up off the floor (only go as far as is comfortable). Then slowly slide your foot back down until your leg is flat on the floor again. Angel Wings: Lying on your back spread your legs to the side as far apart as you can without causing discomfort.  A rehabilitation program following serious knee injuries can speed recovery and prevent re-injury in the future due to weakened muscles. Contact your doctor or a physical therapist for more information on knee rehabilitation.   POST-OPERATIVE OPIOID TAPER INSTRUCTIONS: It is important to wean off of your opioid medication as soon as possible. If you do not need pain medication after your surgery it is ok  to stop day one. Opioids include: Codeine, Hydrocodone(Norco, Vicodin), Oxycodone(Percocet, oxycontin) and hydromorphone amongst others.  Long term and even short term use of opiods can cause: Increased pain response Dependence Constipation Depression Respiratory depression And more.  Withdrawal symptoms can include Flu like symptoms Nausea, vomiting And more Techniques to manage these symptoms Hydrate well Eat regular healthy meals Stay active Use relaxation techniques(deep  breathing, meditating, yoga) Do Not substitute Alcohol to help with tapering If you have been on opioids for less than two weeks and do not have pain than it is ok to stop all together.  Plan to wean off of opioids This plan should start within one week post op of your joint replacement. Maintain the same interval or time between taking each dose and first decrease the dose.  Cut the total daily intake of opioids by one tablet each day Next start to increase the time between doses. The last dose that should be eliminated is the evening dose.   IF YOU ARE TRANSFERRED TO A SKILLED REHAB FACILITY If the patient is transferred to a skilled rehab facility following release from the hospital, a list of the current medications will be sent to the facility for the patient to continue.  When discharged from the skilled rehab facility, please have the facility set up the patient's Pence prior to being released. Also, the skilled facility will be responsible for providing the patient with their medications at time of release from the facility to include their pain medication, the muscle relaxants, and their blood thinner medication. If the patient is still at the rehab facility at time of the two week follow up appointment, the skilled rehab facility will also need to assist the patient in arranging follow up appointment in our office and any transportation needs.  MAKE SURE YOU:  Understand these  instructions.  Get help right away if you are not doing well or get worse.   DENTAL ANTIBIOTICS:  In most cases prophylactic antibiotics for Dental procdeures after total joint surgery are not necessary.  Exceptions are as follows:  1. History of prior total joint infection  2. Severely immunocompromised (Organ Transplant, cancer chemotherapy, Rheumatoid biologic medications such as [place])  3. Poorly controlled diabetes (A1C &gt; 8.0, blood glucose over 200)  If you have one of these conditions, contact your surgeon for an antibiotic prescription, prior to your dental procedure.    Pick up stool softner and laxative for home use following surgery while on pain medications. Do not submerge incision under water. Please use good hand washing techniques while changing dressing each day. May shower starting three days after surgery. Please use a clean towel to pat the incision dry following showers. Continue to use ice for pain and swelling after surgery. Do not use any lotions or creams on the incision until instructed by your surgeon.

## 2022-05-22 NOTE — Transfer of Care (Signed)
Immediate Anesthesia Transfer of Care Note  Patient: Samantha Luna  Procedure(s) Performed: TOTAL KNEE ARTHROPLASTY (Right: Knee)  Patient Location: PACU  Anesthesia Type:Spinal  Level of Consciousness: awake  Airway & Oxygen Therapy: Patient Spontanous Breathing and Patient connected to face mask oxygen  Post-op Assessment: Report given to RN and Post -op Vital signs reviewed and stable  Post vital signs: Reviewed and stable  Last Vitals:  Vitals Value Taken Time  BP    Temp    Pulse 76 05/22/22 1212  Resp 14 05/22/22 1212  SpO2 100 % 05/22/22 1212  Vitals shown include unvalidated device data.  Last Pain:  Vitals:   05/22/22 1015  TempSrc:   PainSc: 0-No pain      Patients Stated Pain Goal: 4 (31/54/00 8676)  Complications: No notable events documented.

## 2022-05-22 NOTE — Plan of Care (Signed)

## 2022-05-22 NOTE — Anesthesia Postprocedure Evaluation (Signed)
Anesthesia Post Note  Patient: Caidynce Muzyka  Procedure(s) Performed: TOTAL KNEE ARTHROPLASTY (Right: Knee)     Patient location during evaluation: PACU Anesthesia Type: Regional and Spinal Level of consciousness: awake and alert Pain management: pain level controlled Vital Signs Assessment: post-procedure vital signs reviewed and stable Respiratory status: spontaneous breathing, nonlabored ventilation and respiratory function stable Cardiovascular status: blood pressure returned to baseline and stable Postop Assessment: no apparent nausea or vomiting Anesthetic complications: no   No notable events documented.  Last Vitals:  Vitals:   05/22/22 1300 05/22/22 1315  BP: 136/68 131/73  Pulse: 79 72  Resp: 18 18  Temp:    SpO2: 96% 95%    Last Pain:  Vitals:   05/22/22 1315  TempSrc:   PainSc: 0-No pain                 Lynda Rainwater

## 2022-05-22 NOTE — Interval H&P Note (Signed)
History and Physical Interval Note:  05/22/2022 8:36 AM  Samantha Luna  has presented today for surgery, with the diagnosis of right knee osteoarthritis.  The various methods of treatment have been discussed with the patient and family. After consideration of risks, benefits and other options for treatment, the patient has consented to  Procedure(s): TOTAL KNEE ARTHROPLASTY (Right) as a surgical intervention.  The patient's history has been reviewed, patient examined, no change in status, stable for surgery.  I have reviewed the patient's chart and labs.  Questions were answered to the patient's satisfaction.     [name]

## 2022-05-22 NOTE — Progress Notes (Signed)
Orthopedic Tech Progress Note Patient Details:  Samantha Luna 02-09-1961 [id number]  CPM put on while in PACU  CPM Right Knee CPM Right Knee: On Right Knee Flexion (Degrees): 40 Right Knee Extension (Degrees): 10  Post Interventions Patient Tolerated: Well Instructions Provided: Care of device  Arville Go 05/22/2022, 12:47 PM

## 2022-05-22 NOTE — Op Note (Signed)
OPERATIVE REPORT-TOTAL KNEE ARTHROPLASTY   Pre-operative diagnosis- Osteoarthritis  Right knee(s)  Post-operative diagnosis- Osteoarthritis Right knee(s)  Procedure-  Right  Total Knee Arthroplasty  Surgeon- Samantha Luna. Samantha Crossno, MD  Assistant- Samantha Barrows, PA-C   Anesthesia-   Adductor canal block and spinal  EBL-50 mL   Drains None  Tourniquet time-  Total Tourniquet Time Documented: area (laterality) - 36 minutes Total: area (laterality) - 36 minutes     Complications- None  Condition-PACU - hemodynamically stable.   Brief Clinical Note  Samantha Luna is a 61 y.o. year old female with end stage OA of her right knee with progressively worsening pain and dysfunction. She has constant pain, with activity and at rest and significant functional deficits with difficulties even with ADLs. She has had extensive non-op management including analgesics, injections of cortisone and viscosupplements, and home exercise program, but remains in significant pain with significant dysfunction.Radiographs show bone on bone arthritis medial and patellofemoral. She presents now for right Total Knee Arthroplasty.     Procedure in detail---   The patient is brought into the operating room and positioned supine on the operating table. After successful administration of  Adductor canal block and spinal,   a tourniquet is placed high on the  Right thigh(s) and the lower extremity is prepped and draped in the usual sterile fashion. Time out is performed by the operating team and then the  Right lower extremity is wrapped in Esmarch, knee flexed and the tourniquet inflated to 300 mmHg.       A midline incision is made with a ten blade through the subcutaneous tissue to the level of the extensor mechanism. A fresh blade is used to make a medial parapatellar arthrotomy. Soft tissue over the proximal medial tibia is subperiosteally elevated to the joint line with a knife and into the semimembranosus bursa with a  Cobb elevator. Soft tissue over the proximal lateral tibia is elevated with attention being paid to avoiding the patellar tendon on the tibial tubercle. The patella is everted, knee flexed 90 degrees and the ACL and PCL are removed. Findings are bone on bone medial and patellofemoral with large global osteophytes        The drill is used to create a starting hole in the distal femur and the canal is thoroughly irrigated with sterile saline to remove the fatty contents. The 5 degree Right  valgus alignment guide is placed into the femoral canal and the distal femoral cutting block is pinned to remove 9 mm off the distal femur. Resection is made with an oscillating saw.      The tibia is subluxed forward and the menisci are removed. The extramedullary alignment guide is placed referencing proximally at the medial aspect of the tibial tubercle and distally along the second metatarsal axis and tibial crest. The block is pinned to remove 4m off the more deficient medial  side. Resection is made with an oscillating saw. Size 4is the most appropriate size for the tibia and the proximal tibia is prepared with the modular drill and keel punch for that size.      The femoral sizing guide is placed and size 5 is most appropriate. Rotation is marked off the epicondylar axis and confirmed by creating a rectangular flexion gap at 90 degrees. The size 5 cutting block is pinned in this rotation and the anterior, posterior and chamfer cuts are made with the oscillating saw. The intercondylar block is then placed and that cut is made.  Trial size 4 tibial component, trial size 5 posterior stabilized femur and a 8  mm posterior stabilized rotating platform insert trial is placed. Full extension is achieved with excellent varus/valgus and anterior/posterior balance throughout full range of motion. The patella is everted and thickness measured to be 22  mm. Free hand resection is taken to 12 mm, a 35 template is placed, lug  holes are drilled, trial patella is placed, and it tracks normally. Osteophytes are removed off the posterior femur with the trial in place. All trials are removed and the cut bone surfaces prepared with pulsatile lavage. Cement is mixed and once ready for implantation, the size 4 tibial implant, size  5 posterior stabilized femoral component, and the size 35 patella are cemented in place and the patella is held with the clamp. The trial insert is placed and the knee held in full extension. The Exparel (20 ml mixed with 60 ml saline) is injected into the extensor mechanism, posterior capsule, medial and lateral gutters and subcutaneous tissues.  All extruded cement is removed and once the cement is hard the permanent 8 mm posterior stabilized rotating platform insert is placed into the tibial tray.      The wound is copiously irrigated with saline solution and the extensor mechanism closed with # 0 Stratofix suture. The tourniquet is released for a total tourniquet time of 36  minutes. Flexion against gravity is 140 degrees and the patella tracks normally. Subcutaneous tissue is closed with 2.0 vicryl and subcuticular with running 4.0 Monocryl. The incision is cleaned and dried and steri-strips and a bulky sterile dressing are applied. The limb is placed into a knee immobilizer and the patient is awakened and transported to recovery in stable condition.      Please note that a surgical assistant was a medical necessity for this procedure in order to perform it in a safe and expeditious manner. Surgical assistant was necessary to retract the ligaments and vital neurovascular structures to prevent injury to them and also necessary for proper positioning of the limb to allow for anatomic placement of the prosthesis.   Samantha Luna Samantha Vidaurri, MD    05/22/2022, 11:46 AM

## 2022-05-22 NOTE — Anesthesia Procedure Notes (Signed)
Anesthesia Regional Block: Adductor canal block   Pre-Anesthetic Checklist: , timeout performed,  Correct Patient, Correct Site, Correct Laterality,  Correct Procedure, Correct Position, site marked,  Risks and benefits discussed,  Surgical consent,  Pre-op evaluation,  At surgeon's request and post-op pain management  Laterality: Right  Prep: chloraprep       Needles:  Injection technique: Single-shot  Needle Type: Stimiplex     Needle Length: 9cm  Needle Gauge: 21     Additional Needles:   Procedures:,,,, ultrasound used (permanent image in chart),,    Narrative:  Start time: 05/22/2022 10:15 AM End time: 05/22/2022 10:20 AM Injection made incrementally with aspirations every 5 mL.  Performed by: Personally  Anesthesiologist: Lynda Rainwater, MD

## 2022-05-23 ENCOUNTER — Other Ambulatory Visit (HOSPITAL_COMMUNITY): Payer: Self-pay

## 2022-05-23 ENCOUNTER — Encounter (HOSPITAL_COMMUNITY): Payer: Self-pay | Admitting: Orthopedic Surgery

## 2022-05-23 DIAGNOSIS — M1711 Unilateral primary osteoarthritis, right knee: Secondary | ICD-10-CM | POA: Diagnosis not present

## 2022-05-23 LAB — BASIC METABOLIC PANEL
Anion gap: 8 (ref 5–15)
BUN: 18 mg/dL (ref 8–23)
CO2: 22 mmol/L (ref 22–32)
Calcium: 8.9 mg/dL (ref 8.9–10.3)
Chloride: 109 mmol/L (ref 98–111)
Creatinine, Ser: 0.72 mg/dL (ref 0.44–1.00)
GFR, Estimated: >60 mL/min (ref >60)
Glucose, Bld: 239 mg/dL — ABNORMAL HIGH (ref 70–99)
Potassium: 3.3 mmol/L — ABNORMAL LOW (ref 3.5–5.1)
Sodium: 139 mmol/L (ref 135–145)

## 2022-05-23 LAB — CBC
HCT: 32.1 % — ABNORMAL LOW (ref 36.0–46.0)
Hemoglobin: 10.2 g/dL — ABNORMAL LOW (ref 12.0–15.0)
MCH: 28.4 pg (ref 26.0–34.0)
MCHC: 31.8 g/dL (ref 30.0–36.0)
MCV: 89.4 fL (ref 80.0–100.0)
Platelets: 268 10*3/uL (ref 150–400)
RBC: 3.59 MIL/uL — ABNORMAL LOW (ref 3.87–5.11)
RDW: 12.8 % (ref 11.5–15.5)
WBC: 11.4 10*3/uL — ABNORMAL HIGH (ref 4.0–10.5)
nRBC: 0 % (ref 0.0–0.2)

## 2022-05-23 MED ORDER — POTASSIUM CHLORIDE CRYS ER 20 MEQ PO TBCR
40.0000 meq | EXTENDED_RELEASE_TABLET | ORAL | Status: AC
  Administered 2022-05-23 (×2): 40 meq via ORAL
  Filled 2022-05-23 (×2): qty 2

## 2022-05-23 MED ORDER — TRAMADOL HCL 50 MG PO TABS
50.0000 mg | ORAL_TABLET | Freq: Four times a day (QID) | ORAL | 0 refills | Status: DC | PRN
  Filled 2022-05-23: qty 40, 5d supply, fill #0

## 2022-05-23 MED ORDER — METHOCARBAMOL 500 MG PO TABS
500.0000 mg | ORAL_TABLET | Freq: Four times a day (QID) | ORAL | 0 refills | Status: DC | PRN
  Filled 2022-05-23: qty 40, 10d supply, fill #0

## 2022-05-23 MED ORDER — HYDROMORPHONE HCL 2 MG PO TABS
2.0000 mg | ORAL_TABLET | Freq: Four times a day (QID) | ORAL | 0 refills | Status: DC | PRN
  Filled 2022-05-23: qty 42, 6d supply, fill #0

## 2022-05-23 MED ORDER — GABAPENTIN 300 MG PO CAPS
ORAL_CAPSULE | ORAL | 0 refills | Status: DC
  Filled 2022-05-23: qty 84, 42d supply, fill #0

## 2022-05-23 MED ORDER — ASPIRIN 325 MG PO TBEC
325.0000 mg | DELAYED_RELEASE_TABLET | Freq: Two times a day (BID) | ORAL | 0 refills | Status: AC
  Filled 2022-05-23: qty 40, 20d supply, fill #0

## 2022-05-23 NOTE — Progress Notes (Signed)
Subjective: 1 Day Post-Op Procedure(s) (LRB): TOTAL KNEE ARTHROPLASTY (Right) Patient reports pain as mild.   Patient seen in rounds by Dr. Wynelle Link. Patient is well, and has had no acute complaints or problems No issues overnight. Denies chest pain, SOB, or calf pain. Foley catheter removed this AM.  We will continue therapy today, did not ambulate yesterday.  Objective: Vital signs in last 24 hours: Temp:  [97.7 F (36.5 C)-98.6 F (37 C)] 98.6 F (37 C) (09/26 0647) Pulse Rate:  [62-103] 87 (09/26 0647) Resp:  [10-24] 20 (09/26 0647) BP: (89-186)/(40-94) 186/78 (09/26 0647) SpO2:  [94 %-100 %] 100 % (09/26 0647) Weight:  [92.1 kg] 92.1 kg (09/25 0823)  Intake/Output from previous day:  Intake/Output Summary (Last 24 hours) at 05/23/2022 0745 Last data filed at 05/23/2022 0600 Gross per 24 hour  Intake 2751.65 ml  Output 900 ml  Net 1851.65 ml     Intake/Output this shift: No intake/output data recorded.  Labs: Recent Labs    05/23/22 0334  HGB 10.2*   Recent Labs    05/23/22 0334  WBC 11.4*  RBC 3.59*  HCT 32.1*  PLT 268   Recent Labs    05/23/22 0334  NA 139  K 3.3*  CL 109  CO2 22  BUN 18  CREATININE 0.72  GLUCOSE 239*  CALCIUM 8.9   No results for input(s): "LABPT", "INR" in the last 72 hours.  Exam: General - Patient is Alert and Oriented Extremity - Neurologically intact Neurovascular intact Sensation intact distally Dorsiflexion/Plantar flexion intact Dressing - dressing C/D/I Motor Function - intact, moving foot and toes well on exam.   Past Medical History:  Diagnosis Date   Acute exacerbation of chronic obstructive airways disease 11/09/2017   Hospitalization 11/09/17 in [place]   Allergic contact dermatitis 09/12/2021   Asthma    Atherosclerosis of native arteries of extremities with intermittent claudication, right leg 08/11/2019   Bell's palsy    Chest pain 45/36/4680   Complication of anesthesia     hysterectomy - slow to wake up   Congestive heart failure 04/26/2010   Echo 11/13/20 in [place] showed normal ejection fraction, normal echo.    COPD (chronic obstructive pulmonary disease) 02/11/2016   Dyspnea    GERD (gastroesophageal reflux disease)    High cholesterol 02/11/2016   History of COVID-19 07/20/2020   Major depressive disorder    Meningioma 04/23/2010   01/28/13 Head CT: A left posterior parafalcine meningioma is seen that measures 1.5 cm,  previously measuring 1.3 cm. Basal cisterns and sulci are unremarkable. Impression:  Left posterior parafalcine meningioma is mildly increased in size since  prior without significant surrounding mass effect or acute process visualized.   Acute or subacute ischemia is not excluded with CT.   Obstructive sleep apnea 08/18/2021   Peripheral vascular disease    Pneumonia 2020   Pre-diabetes    Primary hypertension 08/25/2021    Assessment/Plan: 1 Day Post-Op Procedure(s) (LRB): TOTAL KNEE ARTHROPLASTY (Right) Principal Problem:   Osteoarthritis of right knee  Estimated body mass index is 37.13 kg/m as calculated from the following:   Height as of this encounter: '5\' 2"'$  (1.575 m).   Weight as of this encounter: 92.1 kg. Advance diet Up with therapy D/C IV fluids   Patient's anticipated LOS is less than 2 midnights, meeting these requirements: - Younger than 51 - Lives within 1 hour of care - Has a competent adult at home to recover with post-op  recover - NO history of  - Chronic pain requiring opioids  - Diabetes  - Coronary Artery Disease  - Heart failure  - Heart attack  - Stroke  - DVT/VTE  - Cardiac arrhythmia  - Respiratory Failure  - Renal failure  - Anemia  - Advanced Liver disease  DVT Prophylaxis - Aspirin Weight bearing as tolerated. Continue therapy.  BP elevated, will restart Hyzaar this AM at 10AM. K 3.3, ordered two doses 40 mEq Kcl.  Plan is to go Home after hospital stay. Plan for discharge later  today if progresses with therapy and meeting goals. Scheduled for OPPT at [facility]). Follow-up in the office in 2 weeks.  The PDMP database was reviewed today prior to any opioid medications being prescribed to this patient.  Theresa Duty, PA-C Orthopedic Surgery [phone] 05/23/2022, 7:45 AM

## 2022-05-23 NOTE — TOC Transition Note (Signed)
Transition of Care [facility]) - CM/SW Discharge Note   Patient Details  Name: Samantha Luna MRN: [id number] Date of Birth: 1961-05-16  [facility]) CM/SW Contact:  Lennart Pall, LCSW Phone Number: 05/23/2022, 10:25 AM   Clinical Narrative:    Met with pt and confirming she has all needed DME at home.  OPPT already set up with [facility].  No TOC needs.   Final next level of care: OP Rehab Barriers to Discharge: No Barriers Identified   Patient Goals and CMS Choice Patient states their goals for this hospitalization and ongoing recovery are:: return home      Discharge Placement                       Discharge Plan and Services                DME Arranged: N/A DME Agency: NA                  Social Determinants of Health (SDOH) Interventions     Readmission Risk Interventions     No data to display

## 2022-05-23 NOTE — Progress Notes (Signed)
Physical Therapy Treatment Patient Details Name: Samantha Luna MRN: [id number] DOB: 1961-01-18 Today's Date: 05/23/2022   History of Present Illness 61 yo female s/p R TKA 9-25/23. PMH: Bell's palsy, meningioma, CHF, PVD, CHF, MDD, COPD, PVD, HTN    PT Comments    Pt progressing well, meeting goals. Pt is ready to d/c with family assist as needed from PT standpoint. Dtr present for pm session. Pt overall supervision with improved pain control during mobility, no nausea or dizziness.  Recommendations for follow up therapy are one component of a multi-disciplinary discharge planning process, led by the attending physician.  Recommendations may be updated based on patient status, additional functional criteria and insurance authorization.  Follow Up Recommendations  Follow physician's recommendations for discharge plan and follow up therapies     Assistance Recommended at Discharge Intermittent Supervision/Assistance  Patient can return home with the following Help with stairs or ramp for entrance;Assist for transportation;Assistance with cooking/housework   Equipment Recommendations  None recommended by PT    Recommendations for Other Services       Precautions / Restrictions Precautions Precautions: Fall;Knee Precaution Booklet Issued: Yes (comment) Precaution Comments: IND SLRs, reviewed use for side sleeping/knee extension Required Braces or Orthoses: Knee Immobilizer - Right Knee Immobilizer - Right: Discontinue once straight leg raise with < 10 degree lag Restrictions Weight Bearing Restrictions: No RLE Weight Bearing: Weight bearing as tolerated     Mobility  Bed Mobility Overal bed mobility: Needs Assistance Bed Mobility: Supine to Sit, Sit to Supine     Supine to sit: Supervision, Modified independent (Device/Increase time) Sit to supine: Supervision, Modified independent (Device/Increase time)   General bed mobility comments: no physical assist     Transfers Overall transfer level: Needs assistance Equipment used: Rolling walker (2 wheels) Transfers: Sit to/from Stand, Bed to chair/wheelchair/BSC Sit to Stand: Supervision   Step pivot transfers: Supervision       General transfer comment: cues for hand placement    Ambulation/Gait Ambulation/Gait assistance: Supervision Gait Distance (Feet): 80 Feet (15') Assistive device: Rolling walker (2 wheels) Gait Pattern/deviations: Step-to pattern, Antalgic, Decreased stance time - right       General Gait Details: cues for sequence, terminal knee extension in stance and foot flat on R; improved wt shift to RLE   Chief Strategy Officer    Modified Rankin (Stroke Patients Only)       Balance                                            Cognition Arousal/Alertness: Awake/alert Behavior During Therapy: WFL for tasks assessed/performed Overall Cognitive Status: Within Functional Limits for tasks assessed                                          Exercises Total Joint Exercises Ankle Circles/Pumps: AROM, Both, 10 reps Quad Sets: AROM, Both, 10 reps Heel Slides: AAROM, Right, 10 reps Straight Leg Raises: AAROM, AROM, Right, 10 reps Goniometric ROM: grossly 10 to 70 degrees right knee flexion    General Comments        Pertinent Vitals/Pain Pain Assessment Pain Assessment: 0-10 Pain Score: 3  Pain Location: right knee Pain Descriptors / Indicators: Discomfort Pain Intervention(s):  Limited activity within patient's tolerance, Repositioned, Premedicated before session, Monitored during session    Home Living                          Prior Function            PT Goals (current goals can now be found in the care plan section) Acute Rehab PT Goals Patient Stated Goal: get better, have less pain PT Goal Formulation: With patient Time For Goal Achievement: 06/05/22 Potential to Achieve  Goals: Good Progress towards PT goals: Progressing toward goals    Frequency    7X/week      PT Plan Current plan remains appropriate    Co-evaluation              AM-PAC PT "6 Clicks" Mobility   Outcome Measure  Help needed turning from your back to your side while in a flat bed without using bedrails?: None Help needed moving from lying on your back to sitting on the side of a flat bed without using bedrails?: A Little Help needed moving to and from a bed to a chair (including a wheelchair)?: A Little Help needed standing up from a chair using your arms (e.g., wheelchair or bedside chair)?: A Little Help needed to walk in hospital room?: A Little Help needed climbing 3-5 steps with a railing? : A Little 6 Click Score: 19    End of Session Equipment Utilized During Treatment: Gait belt Activity Tolerance: Patient tolerated treatment well Patient left: in bed;with call bell/phone within reach;with family/visitor present Nurse Communication: Mobility status PT Visit Diagnosis: Other abnormalities of gait and mobility (R26.89);Difficulty in walking, not elsewhere classified (R26.2)     Time: 7035-0093 PT Time Calculation (min) (ACUTE ONLY): 35 min  Charges:  $Gait Training: 8-22 mins $Therapeutic Exercise: 8-22 mins                     Baxter Flattery, PT  Acute Rehab Dept [facility]) [phone]  [facility] Weekend Pager [facility] only)  [phone]  05/23/2022    [facility] 05/23/2022, 4:56 PM

## 2022-05-23 NOTE — Progress Notes (Signed)
Physical Therapy Treatment Patient Details Name: Samantha Luna MRN: [id number] DOB: 1961/07/22 Today's Date: 05/23/2022   History of Present Illness 61 yo female s/p R TKA 9-25/23. PMH: Bell's palsy, meningioma, CHF, PVD, CHF, MDD, COPD, PVD, HTN    PT Comments    Pt progressing well. Some nausea and dizziness with amb. BP 159/56.RN aware Mobilizing well and has supportive dtr as her caregiver. Will see again in pm and likely ready for d/c this afternoon.   Recommendations for follow up therapy are one component of a multi-disciplinary discharge planning process, led by the attending physician.  Recommendations may be updated based on patient status, additional functional criteria and insurance authorization.  Follow Up Recommendations  Follow physician's recommendations for discharge plan and follow up therapies     Assistance Recommended at Discharge Intermittent Supervision/Assistance  Patient can return home with the following Help with stairs or ramp for entrance;Assist for transportation;Assistance with cooking/housework   Equipment Recommendations  None recommended by PT    Recommendations for Other Services       Precautions / Restrictions Precautions Precautions: Fall;Knee Precaution Booklet Issued: Yes (comment) Precaution Comments: IND SLRs, reviewed use for side sleeping/knee extension Required Braces or Orthoses: Knee Immobilizer - Right Knee Immobilizer - Right: Discontinue once straight leg raise with < 10 degree lag Restrictions Weight Bearing Restrictions: No RLE Weight Bearing: Weight bearing as tolerated     Mobility  Bed Mobility Overal bed mobility: Needs Assistance Bed Mobility: Supine to Sit, Sit to Supine     Supine to sit: Supervision Sit to supine: Supervision   General bed mobility comments: for safety only no physical assist    Transfers Overall transfer level: Needs assistance Equipment used: Rolling walker (2 wheels) Transfers: Sit  to/from Stand, Bed to chair/wheelchair/BSC Sit to Stand: Supervision   Step pivot transfers: Supervision       General transfer comment: cues for hand placement    Ambulation/Gait Ambulation/Gait assistance: Supervision, Min guard Gait Distance (Feet): 65 Feet Assistive device: Rolling walker (2 wheels) Gait Pattern/deviations: Step-to pattern, Antalgic, Decreased stance time - right       General Gait Details: cues for sequence, terminal knee extension in stance and foot flat on R   Stairs             Wheelchair Mobility    Modified Rankin (Stroke Patients Only)       Balance                                            Cognition Arousal/Alertness: Awake/alert Behavior During Therapy: WFL for tasks assessed/performed Overall Cognitive Status: Within Functional Limits for tasks assessed                                          Exercises Total Joint Exercises Ankle Circles/Pumps: AROM, Both, 10 reps    General Comments        Pertinent Vitals/Pain Pain Assessment Pain Assessment: 0-10 Pain Score: 5  Pain Location: right knee Pain Descriptors / Indicators: Discomfort Pain Intervention(s): Limited activity within patient's tolerance, Monitored during session, Premedicated before session, Repositioned, Ice applied    Home Living  Prior Function            PT Goals (current goals can now be found in the care plan section) Acute Rehab PT Goals Patient Stated Goal: get better, have less pain PT Goal Formulation: With patient Time For Goal Achievement: 06/05/22 Potential to Achieve Goals: Good Progress towards PT goals: Progressing toward goals    Frequency    7X/week      PT Plan Current plan remains appropriate    Co-evaluation              AM-PAC PT "6 Clicks" Mobility   Outcome Measure  Help needed turning from your back to your side while in a flat bed  without using bedrails?: None Help needed moving from lying on your back to sitting on the side of a flat bed without using bedrails?: A Little Help needed moving to and from a bed to a chair (including a wheelchair)?: A Little Help needed standing up from a chair using your arms (e.g., wheelchair or bedside chair)?: A Little Help needed to walk in hospital room?: A Little Help needed climbing 3-5 steps with a railing? : A Little 6 Click Score: 19    End of Session Equipment Utilized During Treatment: Gait belt Activity Tolerance: Patient tolerated treatment well Patient left: in bed;with call bell/phone within reach;with family/visitor present Nurse Communication: Mobility status PT Visit Diagnosis: Other abnormalities of gait and mobility (R26.89);Difficulty in walking, not elsewhere classified (R26.2)     Time: 9485-4627 PT Time Calculation (min) (ACUTE ONLY): 32 min  Charges:  $Gait Training: 8-22 mins $Therapeutic Activity: 8-22 mins                     Baxter Flattery, PT  Acute Rehab Dept [facility]) [phone]  [facility] Weekend Pager [facility] only)  [phone]  05/23/2022    [facility] 05/23/2022, 11:17 AM

## 2022-05-23 NOTE — Progress Notes (Signed)
Provided discharge education/instructions, all questions and concerns addressed. Pt not in acute distress, discharged home with belongings accompanied by her daughter. 

## 2022-05-23 NOTE — Plan of Care (Signed)

## 2022-05-26 NOTE — Discharge Summary (Signed)
Patient ID: Samantha Luna MRN: [id number] DOB/AGE: 12-24-1960 61 y.o.  Admit date: 05/22/2022 Discharge date: 05/23/2022  Admission Diagnoses:  Principal Problem:   Osteoarthritis of right knee   Discharge Diagnoses:  Same  Past Medical History:  Diagnosis Date   Acute exacerbation of chronic obstructive airways disease 11/09/2017   Hospitalization 11/09/17 in [place]   Allergic contact dermatitis 09/12/2021   Asthma    Atherosclerosis of native arteries of extremities with intermittent claudication, right leg 08/11/2019   Bell's palsy    Chest pain 62/83/1517   Complication of anesthesia    hysterectomy - slow to wake up   Congestive heart failure 04/26/2010   Echo 11/13/20 in [place] showed normal ejection fraction, normal echo.    COPD (chronic obstructive pulmonary disease) 02/11/2016   Dyspnea    GERD (gastroesophageal reflux disease)    High cholesterol 02/11/2016   History of COVID-19 07/20/2020   Major depressive disorder    Meningioma 04/23/2010   01/28/13 Head CT: A left posterior parafalcine meningioma is seen that measures 1.5 cm,  previously measuring 1.3 cm. Basal cisterns and sulci are unremarkable. Impression:  Left posterior parafalcine meningioma is mildly increased in size since  prior without significant surrounding mass effect or acute process visualized.   Acute or subacute ischemia is not excluded with CT.   Obstructive sleep apnea 08/18/2021   Peripheral vascular disease    Pneumonia 2020   Pre-diabetes    Primary hypertension 08/25/2021    Surgeries: Procedure(s): TOTAL KNEE ARTHROPLASTY on 05/22/2022   Consultants:   Discharged Condition: Improved  Hospital Course: Samantha Luna is an 61 y.o. female who was admitted 05/22/2022 for operative treatment ofOsteoarthritis of right knee. Patient has severe unremitting pain that affects sleep, daily activities, and work/hobbies. After pre-op clearance the patient was taken to the  operating room on 05/22/2022 and underwent  Procedure(s): TOTAL KNEE ARTHROPLASTY.    Patient was given perioperative antibiotics:  Anti-infectives (From admission, onward)    Start     Dose/Rate Route Frequency Ordered Stop   05/22/22 1700  ceFAZolin (ANCEF) IVPB 2g/100 mL premix        2 g 200 mL/hr over 30 Minutes Intravenous Every 6 hours 05/22/22 1413 05/23/22 0828   05/22/22 0830  ceFAZolin (ANCEF) IVPB 2g/100 mL premix        2 g 200 mL/hr over 30 Minutes Intravenous On call to O.R. 05/22/22 6160 05/22/22 1051        Patient was given sequential compression devices, early ambulation, and chemoprophylaxis to prevent DVT.  Patient benefited maximally from hospital stay and there were no complications.    Recent vital signs: No data found.   Recent laboratory studies: No results for input(s): "WBC", "HGB", "HCT", "PLT", "NA", "K", "CL", "CO2", "BUN", "CREATININE", "GLUCOSE", "INR", "CALCIUM" in the last 72 hours.  Invalid input(s): "PT", "2"   Discharge Medications:   Allergies as of 05/23/2022       Reactions   Iodine Other (See Comments)   Uncontrollable sneezing    Codeine Itching        Medication List     STOP taking these medications    diclofenac 75 MG EC tablet Commonly known as: VOLTAREN   lidocaine 5 % Commonly known as: LIDODERM       TAKE these medications    albuterol 108 (90 Base) MCG/ACT inhaler Commonly known as: VENTOLIN HFA Inhale 1-2 puffs into the lungs every 6 (six) hours as needed for wheezing  or shortness of breath. Notes to patient: Resume home regimen   aspirin EC 325 MG tablet Take 1 tablet (325 mg total) by mouth 2 (two) times daily for 20 days. Then resume one 81 mg aspirin once a day. What changed:  medication strength how much to take when to take this additional instructions   escitalopram 10 MG tablet Commonly known as: LEXAPRO Take 10 mg by mouth daily.   fluticasone 220 MCG/ACT inhaler Commonly known as:  FLOVENT HFA Inhale 2 puffs into the lungs 2 (two) times daily. Notes to patient: Resume home regimen   gabapentin 300 MG capsule Commonly known as: NEURONTIN Take a 300 mg capsule three times a day for two weeks following surgery.Then take a 300 mg capsule two times a day for two weeks. Then take a 300 mg capsule once a day for two weeks. Then discontinue.   HYDROmorphone 2 MG tablet Commonly known as: DILAUDID Take 1-2 tablets (2-4 mg total) by mouth every 6 (six) hours as needed for severe pain. Notes to patient: Last dose given 09/26 06:31am   ibandronate 150 MG tablet Commonly known as: BONIVA Take 150 mg by mouth every 30 (thirty) days. Notes to patient: Resume home regimen   loratadine 10 MG tablet Commonly known as: CLARITIN Take 10 mg by mouth daily as needed for allergies. Notes to patient: Resume home regimen   losartan-hydrochlorothiazide 100-25 MG tablet Commonly known as: HYZAAR Take 1 tablet by mouth daily.   methocarbamol 500 MG tablet Commonly known as: ROBAXIN Take 1 tablet (500 mg total) by mouth every 6 (six) hours as needed for muscle spasms. What changed:  when to take this reasons to take this Notes to patient: Last dose given 09/26 06:31am   multivitamin with minerals tablet Take 1 tablet by mouth daily. Notes to patient: Resume home regimen   Myrbetriq 50 MG Tb24 tablet Generic drug: mirabegron ER Take 50 mg by mouth every other day. Notes to patient: Resume home regimen   nitroGLYCERIN 0.4 MG SL tablet Commonly known as: NITROSTAT Place 0.4 mg under the tongue every 5 (five) minutes as needed for chest pain. Notes to patient: Resume home regimen   omeprazole 20 MG capsule Commonly known as: PRILOSEC Take 20 mg by mouth daily. Notes to patient: Resume home regimen   rosuvastatin 5 MG tablet Commonly known as: CRESTOR Take 5 mg by mouth daily.   traMADol 50 MG tablet Commonly known as: ULTRAM Take 1-2 tablets (50-100 mg total) by  mouth every 6 (six) hours as needed for moderate pain. Notes to patient: Last dose given 09/26 09:06am   Vitamin D (Ergocalciferol) 1.25 MG (50000 UNIT) Caps capsule Commonly known as: DRISDOL Take 50,000 Units by mouth every Sunday. Notes to patient: Resume home regimen        Diagnostic Studies: DG Chest 2 View  Result Date: 05/17/2022 CLINICAL DATA:  Left-sided chest tightness EXAM: CHEST - 2 VIEW COMPARISON:  10/05/2021 FINDINGS: Unchanged cardiac and mediastinal contours. No focal pulmonary opacity. No pleural effusion or pneumothorax. No acute osseous abnormality. IMPRESSION: No acute cardiopulmonary process. Electronically Signed   By: Merilyn Baba M.D.   On: 05/17/2022 13:11    Disposition: Discharge disposition: 01-Home or Self Care          Follow-up Information     Gaynelle Arabian, MD. Schedule an appointment as soon as possible for a visit in 2 week(s).   Specialty: Orthopedic Surgery Contact information: [address] [place] [address]  [phone]                  Signed: Theresa Duty 05/26/2022, 7:41 AM

## 2022-06-09 ENCOUNTER — Telehealth: Payer: Self-pay

## 2022-06-09 ENCOUNTER — Other Ambulatory Visit: Payer: Self-pay

## 2022-06-09 DIAGNOSIS — E876 Hypokalemia: Secondary | ICD-10-CM

## 2022-06-09 LAB — BASIC METABOLIC PANEL
BUN/Creatinine Ratio: 11 — ABNORMAL LOW (ref 12–28)
BUN: 9 mg/dL (ref 8–27)
CO2: 23 mmol/L (ref 20–29)
Calcium: 9.9 mg/dL (ref 8.7–10.3)
Chloride: 100 mmol/L (ref 96–106)
Creatinine, Ser: 0.79 mg/dL (ref 0.57–1.00)
Glucose: 112 mg/dL — ABNORMAL HIGH (ref 70–99)
Potassium: 4.2 mmol/L (ref 3.5–5.2)
Sodium: 138 mmol/L (ref 134–144)
eGFR: 84 mL/min/{1.73_m2} (ref >59)

## 2022-06-09 NOTE — Telephone Encounter (Signed)
Contacted by [facility] tech that patient is at lab at [place] to get blood work. No orders in Epic. Last BMET showed K+ at 3.3. Order for BMET placed.

## 2022-06-12 NOTE — Progress Notes (Signed)
Labs normal.

## 2022-07-14 ENCOUNTER — Other Ambulatory Visit: Payer: Self-pay | Admitting: General Practice

## 2022-07-14 ENCOUNTER — Encounter: Payer: Self-pay | Admitting: General Practice

## 2022-07-14 DIAGNOSIS — Z1239 Encounter for other screening for malignant neoplasm of breast: Secondary | ICD-10-CM

## 2022-09-06 ENCOUNTER — Telehealth: Payer: Self-pay

## 2022-09-06 NOTE — Telephone Encounter (Signed)
Daughter concerened and is not taken dementia meds, do you want her on anything. Daughter questioning clinic notes and also note she wants a confirmation diagnosis

## 2022-09-06 NOTE — Telephone Encounter (Addendum)
[facility] is calling in stating she is POA over mom and was looking at the last OV with this office. She has some concerns about what was documented and wanted clarification on meds. Did see POA documents that is scanned in.

## 2022-09-07 NOTE — Telephone Encounter (Signed)
Samantha Luna to call at 12:00   Samantha Luna (Daughter) [phone] [facility])

## 2022-09-07 NOTE — Telephone Encounter (Signed)
Wants confirmation that she does have vascular dementia. Wants to continue medication, never was put. Why was this noted, need clarity. Wants needs to do an addendum please.

## 2022-09-08 ENCOUNTER — Telehealth: Payer: Self-pay | Admitting: Physician Assistant

## 2022-09-08 ENCOUNTER — Telehealth: Payer: Self-pay | Admitting: Anesthesiology

## 2022-09-08 ENCOUNTER — Encounter: Payer: Self-pay | Admitting: Physician Assistant

## 2022-09-08 NOTE — Telephone Encounter (Signed)
Pt's daughter Leeann Must called stating she would like to speak to Clarise Cruz about pt's test result. States she knows Clarise Cruz spoke to patient however she needs to speak to Samantha Luna. She would like to speak to her today if possible.

## 2022-09-08 NOTE — Telephone Encounter (Signed)
Spoke with the patient on 09/07/2022 regarding questions she had about her neurocognitive testing and diagnosis. Patient had neuropsychological evaluation on 02/08/2022, yielding a neuropsychological functioning largely within normal limits relative to age matched peers.  At this time, no antidementia medication is indicated, we will continue to follow-up.  Recommended good control of cardiovascular risk factors.  Patient was appreciative of the phone call, and she was instructed to contact us if she has any other questions or concerns.  It is a pleasure to care for this nice patient.

## 2022-09-08 NOTE — Telephone Encounter (Signed)
I sent a note, will be happy to set a 15 min telephone call to discuss the results next week. Thanks

## 2022-09-08 NOTE — Telephone Encounter (Signed)
I cannot speak today, unless a patient does not show. My schedule is full . I have spoken to mother and she has understood the findings which I went in detail . No indication for medicines. No dementia according to the notes. , thanks

## 2022-09-11 ENCOUNTER — Telehealth (INDEPENDENT_AMBULATORY_CARE_PROVIDER_SITE_OTHER): Payer: 59 | Admitting: Physician Assistant

## 2022-09-11 DIAGNOSIS — R413 Other amnesia: Secondary | ICD-10-CM

## 2022-09-11 NOTE — Progress Notes (Signed)
   Assessment/Plan:    Samantha Luna is a very pleasant 62 y.o. RH female with a history of CHF, HLD, OA, OSA on CPAP, asthma, tobacco use, COPD, history of meningioma (2011), anxiety, depression, hypertension, recent evaluation by neuropsychological evaluation yielding results within normal limits relative to age matched peers.  According to neuropsychological notes, there was no sufficient cognitive dysfunction to warrant a formal diagnosis surrounding a neurocognitive disorder.  She has a history of mild sleep dysfunction, ongoing chronic pain, and mild to moderate psychiatric distress, that, when combined together, can create or worsen cognitive inefficiencies as per Neuropsych.  MRI of the brain personally reviewed remarkable for chronic small vessel ischemia greatest in the pons, and known 13 mm dural based mass in the left occipital pole consistent with a history of meningioma, without significant growth.  She is not on antidementia medications.  Repeat neuropsychological evaluation in 24 to 36 months or sooner if functional decline were noted was recommended by neuropsychology, as well as recommending a combination of medications for mood, and psychotherapy for the treatment of anxiety and depression.  Also recommended, good control of cardiovascular risk factors and the use of CPAP.    Daughter, who is healthcare power of attorney, is "concerned that the vascular issues are causing mood changes "; of note, the patient has been experiencing depression for a long time, after the death of several family members . She has had 2 visits with psychotherapy, and has been placed on behavioral health medications ( unable to find these notes on My Chart at this time). Her chart has been reviewed via video call with the patient Samantha Luna, daughter Samantha Luna, myself, and Tristan Schroeder aware of his Scientist, physiological.  We offered to allow our neuropsychologist, Dr. Melvyn Novas to review once again the findings from the  last Neuropsychological testing in June 2023, versus repeat the Neuropsychological testing for clarity of the diagnosis-which she declined, versus repeating MRI of the brain if it were necessary as daughter is concerned that her "vascular changes are worse".  We also recommended that she come to the office for a formal visit, we would accommodate her today, she declined.  Daughter wishes for her mother to continue her care with a different provider in our practice and the patient, Samantha Luna  stated that she would do with her daughter decided.  Patient care to be transferred to another provider per daughter's request and  all questions have been answered.  Time of call 25 minutes. Will be no charge to this visit.

## 2022-09-12 ENCOUNTER — Telehealth: Payer: Self-pay

## 2022-09-12 ENCOUNTER — Other Ambulatory Visit: Payer: Self-pay

## 2022-09-12 DIAGNOSIS — R413 Other amnesia: Secondary | ICD-10-CM

## 2022-09-12 DIAGNOSIS — D329 Benign neoplasm of meninges, unspecified: Secondary | ICD-10-CM

## 2022-09-12 NOTE — Telephone Encounter (Signed)
Pt daughter called an informed that MRI of the brain has been ordered an that [facility] will reach out to them to get it scheduled.

## 2022-09-13 ENCOUNTER — Ambulatory Visit
Admission: RE | Admit: 2022-09-13 | Discharge: 2022-09-13 | Disposition: A | Discharge status: Home / Self Care | Payer: 59 | Source: Ambulatory Visit | Attending: Family | Admitting: Family

## 2022-09-13 DIAGNOSIS — Z1239 Encounter for other screening for malignant neoplasm of breast: Secondary | ICD-10-CM

## 2022-10-14 ENCOUNTER — Ambulatory Visit
Admission: RE | Admit: 2022-10-14 | Discharge: 2022-10-14 | Disposition: A | Discharge status: Home / Self Care | Payer: 59 | Source: Ambulatory Visit | Attending: Neurology | Admitting: Neurology

## 2022-10-14 DIAGNOSIS — D329 Benign neoplasm of meninges, unspecified: Secondary | ICD-10-CM

## 2022-10-14 DIAGNOSIS — R413 Other amnesia: Secondary | ICD-10-CM

## 2022-10-14 MED ORDER — GADOPICLENOL 0.5 MMOL/ML IV SOLN
10.0000 mL | Freq: Once | INTRAVENOUS | Status: AC | PRN
  Administered 2022-10-14: 10 mL via INTRAVENOUS

## 2022-10-19 ENCOUNTER — Telehealth: Payer: Self-pay

## 2022-10-19 ENCOUNTER — Encounter: Payer: Self-pay | Admitting: Neurology

## 2022-10-19 NOTE — Telephone Encounter (Signed)
Pt daughter called an informed the brain MRI did not show any changes from her prior scan. Pt daughter questioning Extensive chronic small-vessel ischemic changes of the pons. Mild chronic small-vessel ischemic change of the cerebral hemispheric white matter. She was advised that I would ask Dr Delice Lesch and call her back

## 2022-10-19 NOTE — Telephone Encounter (Signed)
-----   Message from Cameron Sprang, MD sent at 10/19/2022  8:55 AM EST ----- Pls let daughter know the brain MRI did not show any changes from her prior scan. Thanks

## 2022-10-24 NOTE — Telephone Encounter (Signed)
Pls call daughter and let her know we can schedule an earlier visit to go over the brain MRI and any other questions or concerns. Ok for 1pm slot (1-hour since she is a new patient to me, she is Sara's patient). Thanks

## 2022-11-01 ENCOUNTER — Ambulatory Visit (INDEPENDENT_AMBULATORY_CARE_PROVIDER_SITE_OTHER): Payer: 59 | Admitting: Neurology

## 2022-11-01 ENCOUNTER — Encounter: Payer: Self-pay | Admitting: Neurology

## 2022-11-01 VITALS — BP 102/46 | HR 84 | Ht 62.0 in | Wt 210.6 lb

## 2022-11-01 DIAGNOSIS — R413 Other amnesia: Secondary | ICD-10-CM | POA: Diagnosis not present

## 2022-11-01 NOTE — Progress Notes (Signed)
NEUROLOGY FOLLOW UP OFFICE NOTE  Samantha Luna [id number] [birth year]  HISTORY OF PRESENT ILLNESS: I had the pleasure of seeing Samantha Luna in follow-up in the neurology clinic on 11/01/2022.  The patient was last seen by Memory Disorders PA Samantha Luna 2 months ago for memory loss. She is accompanied by her daughter Samantha Luna who helps supplement the history today.  Records and images were personally reviewed where available.  Patient was initially seen in 07/2021 for memory loss, MoCA was 23/30. I personally reviewed brain MRI without contrast done 08/2021, no acute changes, there was a hypointense mass on the left and superior to the torcula measuring 60mm with CSF cleft consistent with meningioma, mild chronic microvascular disease in the periventricular white matter and more moderate in the pons. She underwent Neuropsychological testing in 12/2021 which was largely within normal limits, primary weakness in encoding novel verbal information and variability across executive functioning. It was felt there could be a vascular contribution to ongoing dysfunction. She also reported mild to moderate psychiatric distress, mild sleep dysfunction, and ongoing chronic pain which can create and/or worsen cognitive inefficiencies. On follow-up with Samantha Luna in January, daughter expressed concern that vascular issues are causing mood changes. Repeat MRI brain with and without contrast was done 10/16/22 with no significant change from prior scan. They present to discuss results and concerns.  She feels her memory is "okay." Her daughter started noticing memory changes around 3 years ago where she would repeat the same stories within 5 minutes in the same conversation. She moved from [place] to live with her daughter almost 2 years ago, and Samantha Luna noticed that she would get confused with medications, she would continue taking it even when instructed to stop it, or need reminds that she is taking one medication and not  another. She would take medication twice a day instead of once a day, or say she does not want to take all of them. She felt like she was overmedicated. She would take vinegar before her medications and admit to forgetting medication some weeks. She admits to forgetting boll payments, Samantha Luna recalls she forgot to pay her car insurance 1-2 months ago and had to fix it, she states she was about to get a new car and did not want to pay twice. She denies getting lost driving, Samantha Luna reminds her she got lost 2 days ago going to her daughter;s house, she says she was coming [place] different [place]. No hygiene concerns, she is independent with dressing and bathing. She denies leaving the stove on or misplacing things frequently. Her father, mother, and sister had dementia. No alcohol use. She usually gets 4-6 hours of sleep. She lives alone. She reports mood is great. Samantha Luna thinks mood is a whole lot better, she is happier/more cheerful since knee surgery in 04/2022. No hallucinations or paranoia. She feels like she has racing thoughts a lot of times, getting confused a lot. She states "sometimes pieces are not coming together." She was told by her sleep specialist that "you sleep but you're not sleeping, brain is constantly going." Samantha Luna notes she has been through a lot and never processed it. There may be some unprocessed trauma.    PAST MEDICAL HISTORY: Past Medical History:  Diagnosis Date   Acute exacerbation of chronic obstructive airways disease 11/09/2017   Hospitalization 11/09/17 in [place]   Allergic contact dermatitis 09/12/2021   Asthma    Atherosclerosis of native arteries of extremities with intermittent claudication,  right leg 08/11/2019   Bell's palsy    Chest pain 82/42/3536   Complication of anesthesia    hysterectomy - slow to wake up   Congestive heart failure 04/26/2010   Echo 11/13/20 in [place] showed normal ejection fraction, normal echo.     COPD (chronic obstructive pulmonary disease) 02/11/2016   Dyspnea    GERD (gastroesophageal reflux disease)    High cholesterol 02/11/2016   History of COVID-19 07/20/2020   Major depressive disorder    Meningioma 04/23/2010   01/28/13 Head CT: A left posterior parafalcine meningioma is seen that measures 1.5 cm,  previously measuring 1.3 cm. Basal cisterns and sulci are unremarkable. Impression:  Left posterior parafalcine meningioma is mildly increased in size since  prior without significant surrounding mass effect or acute process visualized.   Acute or subacute ischemia is not excluded with CT.   Obstructive sleep apnea 08/18/2021   Peripheral vascular disease    Pneumonia 2020   Pre-diabetes    Primary hypertension 08/25/2021    MEDICATIONS: Current Outpatient Medications on File Prior to Visit  Medication Sig Dispense Refill   albuterol (VENTOLIN HFA) 108 (90 Base) MCG/ACT inhaler Inhale 1-2 puffs into the lungs every 6 (six) hours as needed for wheezing or shortness of breath.     aspirin EC 81 MG tablet Take 81 mg by mouth daily. Swallow whole.     escitalopram (LEXAPRO) 10 MG tablet Take 10 mg by mouth daily.     fluticasone (FLOVENT HFA) 220 MCG/ACT inhaler Inhale 2 puffs into the lungs 2 (two) times daily.     ibandronate (BONIVA) 150 MG tablet Take 150 mg by mouth every 30 (thirty) days.     loratadine (CLARITIN) 10 MG tablet Take 10 mg by mouth daily as needed for allergies.     losartan-hydrochlorothiazide (HYZAAR) 100-25 MG tablet Take 1 tablet by mouth daily.     Melatonin-Pyridoxine (MELATIN PO) Take by mouth.     Multiple Vitamins-Minerals (MULTIVITAMIN WITH MINERALS) tablet Take 1 tablet by mouth daily.     nitroGLYCERIN (NITROSTAT) 0.4 MG SL tablet Place 0.4 mg under the tongue every 5 (five) minutes as needed for chest pain.     omeprazole (PRILOSEC) 20 MG capsule Take 20 mg by mouth daily.     rosuvastatin (CRESTOR) 5 MG tablet Take 5 mg by mouth daily.      MYRBETRIQ 50 MG TB24 tablet Take 50 mg by mouth every other day. (Patient not taking: Reported on 11/01/2022)     traMADol (ULTRAM) 50 MG tablet Take 1-2 tablets (50-100 mg total) by mouth every 6 (six) hours as needed for moderate pain. (Patient not taking: Reported on 11/01/2022) 40 tablet 0   No current facility-administered medications on file prior to visit.    ALLERGIES: Allergies  Allergen Reactions   Iodine Other (See Comments)    Uncontrollable sneezing    Codeine Itching    FAMILY HISTORY: Family History  Problem Relation Age of Onset   Memory loss Mother    Dementia Father    Asthma Sister    Immunodeficiency Daughter    Asthma Daughter    Anxiety disorder Daughter    Depression Daughter    Depression Daughter    Asthma Daughter    Bipolar disorder Daughter    Immunodeficiency Grandson    Asthma Grandson     SOCIAL HISTORY: Social History   Socioeconomic History   Marital status: Single    Spouse name: Not on file   Number  of children: Not on file   Years of education: 12   Highest education level: High school graduate  Occupational History   Occupation: Retired    Comment: Recruitment consultant  Tobacco Use   Smoking status: Former    Packs/day: 1.00    Years: 40.00    Total pack years: 40.00    Types: Cigarettes    Quit date: 08/28/2021    Years since quitting: 1.1   Smokeless tobacco: Never  Vaping Use   Vaping Use: Never used  Substance and Sexual Activity   Alcohol use: Yes    Comment: Very infrequent   Drug use: Not Currently    Types: Marijuana    Comment: Pt reports that a joint will last her several months   Sexual activity: Not Currently  Other Topics Concern   Not on file  Social History Narrative   Right handed    Social Determinants of Health   Financial Resource Strain: Not on file  Food Insecurity: No Food Insecurity (05/22/2022)   Hunger Vital Sign    Worried About Running Out of Food in the Last Year: Never true    [place] in  the Last Year: Never true  Transportation Needs: No Transportation Needs (05/22/2022)   PRAPARE - Hydrologist (Medical): No    Lack of Transportation (Non-Medical): No  Physical Activity: Not on file  Stress: Not on file  Social Connections: Not on file  Intimate Partner Violence: Not At Risk (05/22/2022)   Humiliation, Afraid, Rape, and Kick questionnaire    Fear of Current or Ex-Partner: No    Emotionally Abused: No    Physically Abused: No    Sexually Abused: No     PHYSICAL EXAM: Vitals:   11/01/22 1251  BP: (!) 102/46  Pulse: 84  SpO2: 97%   General: No acute distress Head:  Normocephalic/atraumatic Skin/Extremities: No rash, no edema Neurological Exam: alert and awake. No aphasia or dysarthria. Fund of knowledge is appropriate.  Attention and concentration are normal.   Cranial nerves: Pupils equal, round. Extraocular movements intact with no nystagmus. Visual fields full.  No facial asymmetry.  Motor: Bulk and tone normal, muscle strength 5/5 throughout with no pronator drift.   Finger to nose testing intact.  Gait narrow-based and steady, no ataxia.  Romberg negative. No tremor.   IMPRESSION: This is a 62 yo RH woman with a history of hypertension, hyperlipidemia, with memory loss. Neuropsychological evaluation in 12/2021 was largely within normal limits, it was felt there could be a vascular contribution to ongoing dysfunction. We reviewed both MRI studies done in 08/2021 and 09/2022, discussed chronic microvascular disease more in the pons than cerebral white matter. Discussed continued memory changes with history provided raising concern for a memory storage disorder. We discussed doing further evaluation with a lumbar puncture for CSF AD biomarkers. Repeat Neurocognitive testing will be ordered to assess trajectory. We discussed the importance of control of vascular risk factors, physical and brain stimulation exercises, MIND diet for overall brain  health. She was advised to start using pillpacks to help with medication monitoring. Continue to monitor driving. All their questions were answered to the best of my abilities. Follow-up after tests, call for any changes.   Thank you for allowing me to participate in her care.  Please do not hesitate to call for any questions or concerns.    Ellouise Newer, M.D.   CC: Dr. Quentin Cornwall

## 2022-11-01 NOTE — Patient Instructions (Addendum)
Good to meet you.  Schedule spinal tap  2. Repeat Neurocognitive testing  3. Recommend using pillpacks to help track down medications   4. Follow-up after spinal tap results are back, call for any changes   FALL PRECAUTIONS: Be cautious when walking. Scan the area for obstacles that may increase the risk of trips and falls. When getting up in the mornings, sit up at the edge of the bed for a few minutes before getting out of bed. Consider elevating the bed at the head end to avoid drop of blood pressure when getting up. Walk always in a well-lit room (use night lights in the walls). Avoid area rugs or power cords from appliances in the middle of the walkways. Use a walker or a cane if necessary and consider physical therapy for balance exercise. Get your eyesight checked regularly.  FINANCIAL OVERSIGHT: Supervision, especially oversight when making financial decisions or transactions is also recommended as difficulties arise.  HOME SAFETY: Consider the safety of the kitchen when operating appliances like stoves, microwave oven, and blender. Consider having supervision and share cooking responsibilities until no longer able to participate in those. Accidents with firearms and other hazards in the house should be identified and addressed as well.  DRIVING: Regarding driving, in patients with progressive memory problems, driving will be impaired. We advise to have someone else do the driving if trouble finding directions or if minor accidents are reported. Independent driving assessment is available to determine safety of driving.  ABILITY TO BE LEFT ALONE: If patient is unable to contact 911 operator, consider using LifeLine, or when the need is there, arrange for someone to stay with patients. Smoking is a fire hazard, consider supervision or cessation. Risk of wandering should be assessed by caregiver and if detected at any point, supervision and safe proof recommendations should be  instituted.  MEDICATION SUPERVISION: Inability to self-administer medication needs to be constantly addressed. Implement a mechanism to ensure safe administration of the medications.  RECOMMENDATIONS FOR ALL PATIENTS WITH MEMORY PROBLEMS: 1. Continue to exercise (Recommend 30 minutes of walking everyday, or 3 hours every week) 2. Increase social interactions - continue going to [place] and enjoy social gatherings with friends and family 3. Eat healthy, avoid fried foods and eat more fruits and vegetables 4. Maintain adequate blood pressure, blood sugar, and blood cholesterol level. Reducing the risk of stroke and cardiovascular disease also helps promoting better memory. 5. Avoid stressful situations. Live a simple life and avoid aggravations. Organize your time and prepare for the next day in anticipation. 6. Sleep well, avoid any interruptions of sleep and avoid any distractions in the bedroom that may interfere with adequate sleep quality 7. Avoid sugar, avoid sweets as there is a strong link between excessive sugar intake, diabetes, and cognitive impairment We discussed the Mediterranean diet, which has been shown to help patients reduce the risk of progressive memory disorders and reduces cardiovascular risk. This includes eating fish, eat fruits and green leafy vegetables, nuts like almonds and hazelnuts, walnuts, and also use olive oil. Avoid fast foods and fried foods as much as possible. Avoid sweets and sugar as sugar use has been linked to worsening of memory function.  There is always a concern of gradual progression of memory problems. If this is the case, then we may need to adjust level of care according to patient needs. Support, both to the patient and caregiver, should then be put into place.

## 2022-11-15 ENCOUNTER — Telehealth: Payer: Self-pay | Admitting: Physician Assistant

## 2022-11-15 DIAGNOSIS — R413 Other amnesia: Secondary | ICD-10-CM

## 2022-11-15 NOTE — Telephone Encounter (Signed)
Sent, thanks

## 2022-11-15 NOTE — Telephone Encounter (Signed)
Pt's daughter called in stating the pt was referred to have a lumbar puncture, but they have not heard back from anyone.

## 2022-11-15 NOTE — Addendum Note (Signed)
Addended by: Jake Seats on: 11/15/2022 01:25 PM   Modules accepted: Orders

## 2022-11-15 NOTE — Telephone Encounter (Signed)
Lp order placed For her LP, pls order:   1. CSF cell count  2. CSF glucose, protein  3. [facility] Alzheimer's panel   Pt daughter called informed  [facility] will call to get her scheduled

## 2022-12-06 ENCOUNTER — Inpatient Hospital Stay
Admission: RE | Admit: 2022-12-06 | Discharge: 2022-12-06 | Disposition: A | Discharge status: Home / Self Care | Payer: 59 | Source: Ambulatory Visit | Attending: Neurology | Admitting: Neurology

## 2022-12-06 NOTE — Discharge Instructions (Signed)

## 2022-12-11 ENCOUNTER — Encounter: Payer: Self-pay | Admitting: *Deleted

## 2022-12-13 ENCOUNTER — Encounter: Payer: Self-pay | Admitting: Cardiology

## 2022-12-13 ENCOUNTER — Ambulatory Visit: Payer: 59 | Attending: Cardiology | Admitting: Cardiology

## 2022-12-13 VITALS — BP 132/84 | HR 84 | Ht 62.0 in | Wt 214.2 lb

## 2022-12-13 DIAGNOSIS — E669 Obesity, unspecified: Secondary | ICD-10-CM

## 2022-12-13 DIAGNOSIS — E782 Mixed hyperlipidemia: Secondary | ICD-10-CM | POA: Diagnosis not present

## 2022-12-13 DIAGNOSIS — I779 Disorder of arteries and arterioles, unspecified: Secondary | ICD-10-CM | POA: Diagnosis not present

## 2022-12-13 DIAGNOSIS — I10 Essential (primary) hypertension: Secondary | ICD-10-CM | POA: Diagnosis not present

## 2022-12-13 NOTE — Patient Instructions (Signed)
Medication Instructions:  Your physician recommends that you continue on your current medications as directed. Please refer to the Current Medication list given to you today.  *If you need a refill on your cardiac medications before your next appointment, please call your pharmacy*   Lab Work: None   Testing/Procedures: Your physician has requested that you have a carotid duplex. This test is an ultrasound of the carotid arteries in your neck. It looks at blood flow through these arteries that supply the brain with blood. Allow one hour for this exam. There are no restrictions or special instructions.    Follow-Up: At [facility], you and your health needs are our priority.  As part of our continuing mission to provide you with exceptional heart care, we have created designated Provider Care Teams.  These Care Teams include your primary Cardiologist (physician) and Advanced Practice Providers (APPs -  Physician Assistants and Nurse Practitioners) who all work together to provide you with the care you need, when you need it.  Your next appointment:   1 year(s)  Provider:   Thomasene Ripple, DO

## 2022-12-14 NOTE — Progress Notes (Signed)
Cardiology Office Note:    Date:  12/14/2022   ID:  Samantha Luna, DOB 05-Apr-1961, MRN [id number]  PCP:  Verlon Au, MD  Cardiologist:  Thomasene Ripple, DO  Electrophysiologist:  None   Referring MD: Verlon Au, MD   " I am having some chest wall pain"  History of Present Illness:    Samantha Luna is a 62 y.o. female with a hx of hypertension, coronary artery disease, hyperlipidemia, obesity, meningioma, per care everywhere in [place] the patient was noted to have congestive heart failure.  The patient was recently admitted to [facility] because she had been experiencing chest discomfort.  At that time she underwent a coronary CT scan with did not show any evidence of coronary artery disease or any lung pathology.  She was discharged home.  At her visit in October she was planning her surgery.  I did clear the patient for her surgical procedure.  Since I saw her she has followed in our office with Tereso Newcomer, PA during our visit was another follow-up for preop.  She has since had bilateral knee replacement.  She is recovering.  Today from a cardiovascular standpoint she does not report any significant complaints.  She though is contemplating potential breast reduction.   Past Medical History:  Diagnosis Date   Acute exacerbation of chronic obstructive airways disease 11/09/2017   Hospitalization 11/09/17 in [place] - See Care Everywhere   Allergic contact dermatitis 09/12/2021   Asthma    Atherosclerosis of native arteries of extremities with intermittent claudication, right leg 08/11/2019   Bell's palsy    Chest pain 05/07/2010   Complication of anesthesia    hysterectomy - slow to wake up   Congestive heart failure 04/26/2010   Echo 11/13/20 in [place] showed normal ejection fraction, normal echo.    COPD (chronic obstructive pulmonary disease) 02/11/2016   Dyspnea    GERD (gastroesophageal reflux disease)    High cholesterol 02/11/2016   History of  COVID-19 07/20/2020   Major depressive disorder    Meningioma 04/23/2010   01/28/13 Head CT: A left posterior parafalcine meningioma is seen that measures 1.5 cm,  previously measuring 1.3 cm. Basal cisterns and sulci are unremarkable. Impression:  Left posterior parafalcine meningioma is mildly increased in size since  prior without significant surrounding mass effect or acute process visualized.   Acute or subacute ischemia is not excluded with CT.   Obstructive sleep apnea 08/18/2021   Peripheral vascular disease    Pneumonia 2020   Pre-diabetes    Primary hypertension 08/25/2021    Past Surgical History:  Procedure Laterality Date   ABDOMINAL HYSTERECTOMY  2017   meniscus repair on right knee  Right 2017   TONSILLECTOMY     as a child   TOTAL KNEE ARTHROPLASTY Left 10/03/2021   Procedure: TOTAL KNEE ARTHROPLASTY;  Surgeon: Ollen Gross, MD;  Location: [facility];  Service: Orthopedics;  Laterality: Left;   TOTAL KNEE ARTHROPLASTY Right 05/22/2022   Procedure: TOTAL KNEE ARTHROPLASTY;  Surgeon: Ollen Gross, MD;  Location: [facility];  Service: Orthopedics;  Laterality: Right;    Current Medications: Current Meds  Medication Sig   albuterol (VENTOLIN HFA) 108 (90 Base) MCG/ACT inhaler Inhale 1-2 puffs into the lungs every 6 (six) hours as needed for wheezing or shortness of breath.   aspirin EC 81 MG tablet Take 81 mg by mouth daily. Swallow whole.   escitalopram (LEXAPRO) 10 MG tablet Take 10 mg by mouth daily.  fluticasone (FLOVENT HFA) 220 MCG/ACT inhaler Inhale 2 puffs into the lungs 2 (two) times daily.   ibandronate (BONIVA) 150 MG tablet Take 150 mg by mouth every 30 (thirty) days.   loratadine (CLARITIN) 10 MG tablet Take 10 mg by mouth daily as needed for allergies.   losartan-hydrochlorothiazide (HYZAAR) 100-25 MG tablet Take 1 tablet by mouth daily.   Melatonin-Pyridoxine (MELATIN PO) Take by mouth.   Multiple Vitamins-Minerals (MULTIVITAMIN WITH MINERALS) tablet Take 1  tablet by mouth daily.   MYRBETRIQ 50 MG TB24 tablet Take 50 mg by mouth every other day.   omeprazole (PRILOSEC) 20 MG capsule Take 20 mg by mouth daily.   rosuvastatin (CRESTOR) 5 MG tablet Take 5 mg by mouth daily.   traMADol (ULTRAM) 50 MG tablet Take 1-2 tablets (50-100 mg total) by mouth every 6 (six) hours as needed for moderate pain.     Allergies:   Iodine and Codeine   Social History   Socioeconomic History   Marital status: Single    Spouse name: Not on file   Number of children: Not on file   Years of education: 12   Highest education level: High school graduate  Occupational History   Occupation: Retired    Comment: Midwife  Tobacco Use   Smoking status: Former    Packs/day: 1.00    Years: 40.00    Additional pack years: 0.00    Total pack years: 40.00    Types: Cigarettes    Quit date: 08/28/2021    Years since quitting: 1.2   Smokeless tobacco: Never  Vaping Use   Vaping Use: Never used  Substance and Sexual Activity   Alcohol use: Yes    Comment: Very infrequent   Drug use: Not Currently    Types: Marijuana    Comment: Pt reports that a joint will last her several months   Sexual activity: Not Currently  Other Topics Concern   Not on file  Social History Narrative   Right handed    Social Determinants of Health   Financial Resource Strain: Not on file  Food Insecurity: No Food Insecurity (05/22/2022)   Hunger Vital Sign    Worried About Running Out of Food in the Last Year: Never true    Ran Out of Food in the Last Year: Never true  Transportation Needs: No Transportation Needs (05/22/2022)   PRAPARE - Administrator, Civil Service (Medical): No    Lack of Transportation (Non-Medical): No  Physical Activity: Not on file  Stress: Not on file  Social Connections: Not on file     Family History: The patient's family history includes Anxiety disorder in her daughter; Asthma in her daughter, daughter, grandson, and sister; Bipolar  disorder in her daughter; Dementia in her father; Depression in her daughter and daughter; Immunodeficiency in her daughter and grandson; Memory loss in her mother.  ROS:   Review of Systems  Constitution: Negative for decreased appetite, fever and weight gain.  HENT: Negative for congestion, ear discharge, hoarse voice and sore throat.   Eyes: Negative for discharge, redness, vision loss in right eye and visual halos.  Cardiovascular: Reports for chest pain.negative for dyspnea on exertion, leg swelling, orthopnea and palpitations.  Respiratory: Negative for cough, hemoptysis, shortness of breath and snoring.   Endocrine: Negative for heat intolerance and polyphagia.  Hematologic/Lymphatic: Negative for bleeding problem. Does not bruise/bleed easily.  Skin: Negative for flushing, nail changes, rash and suspicious lesions.  Musculoskeletal: Negative for arthritis,  joint pain, muscle cramps, myalgias, neck pain and stiffness.  Gastrointestinal: Negative for abdominal pain, bowel incontinence, diarrhea and excessive appetite.  Genitourinary: Negative for decreased libido, genital sores and incomplete emptying.  Neurological: Negative for brief paralysis, focal weakness, headaches and loss of balance.  Psychiatric/Behavioral: Negative for altered mental status, depression and suicidal ideas.  Allergic/Immunologic: Negative for HIV exposure and persistent infections.    EKGs/Labs/Other Studies Reviewed:    The following studies were reviewed today:   EKG:  The ekg ordered today demonstrates sinus rhythm, heart rate 81 bpm.  CCTA 08/18/2021 Coronary Arteries:  Normal coronary origin.  Right dominance.   RCA is a large dominant artery that gives rise to PDA and PLA. There is no plaque.   Left main is a large artery that gives rise to LAD and LCX arteries.   LAD is a large vessel that has no plaque.   LCX is a non-dominant artery that gives rise to one large OM1 branch. There is no  plaque.   Other findings:   Left Ventricle: Normal size   Left Atrium: Normal size   Pulmonary Veins: Normal configuration   Right Ventricle: Normal size   Right Atrium: Normal size   Thoracic aorta: Normal size   Pulmonary Arteries: Normal size   Systemic Veins: Normal drainage   Pericardium: Normal thickness   IMPRESSION: 1. Coronary calcium score of 0.   2. Normal coronary origin with right dominance.   3. No evidence of CAD. There are significant step artifacts due to cardiac motion, but no CAD is seen   CAD-RADS 0. No evidence of CAD (0%). Consider non-atherosclerotic causes of chest pain.     Electronically Signed   By: Epifanio Lesches M.D.   On: 08/18/2021 12:17    Addended by Little Ishikawa, MD on 08/18/2021 12:19 PM   Study Result  Narrative & Impression  EXAM: OVER-READ INTERPRETATION  CT CHEST   The following report is an over-read performed by radiologist Dr. Genevive Bi of [facility] on 08/18/2021. This over-read does not include interpretation of cardiac or coronary anatomy or pathology. The coronary calcium score/coronary CTA interpretation by the cardiologist is attached.   COMPARISON:  None.   FINDINGS: Limited view of the lung parenchyma demonstrates no suspicious nodularity. Airways are normal.   Limited view of the mediastinum demonstrates no adenopathy. Esophagus normal.   Limited view of the upper abdomen unremarkable.   Limited view of the skeleton and chest wall is unremarkable.   IMPRESSION: No significant extracardiac findings.   Electronically Signed: By: Genevive Bi M.D.    Recent Labs: 05/17/2022: B Natriuretic Peptide 6.7 05/23/2022: Hemoglobin 10.2; Platelets 268 06/09/2022: BUN 9; Creatinine, Ser 0.79; Potassium 4.2; Sodium 138  Recent Lipid Panel    Component Value Date/Time   CHOL 143 08/18/2021 0244   TRIG 55 08/18/2021 0244   HDL 39 (L) 08/18/2021 0244   CHOLHDL  3.7 08/18/2021 0244   VLDL 11 08/18/2021 0244   LDLCALC 93 08/18/2021 0244    Physical Exam:    VS:  BP 132/84   Pulse 84   Ht 5\' 2"  (1.575 m)   Wt 214 lb 3.2 oz (97.2 kg)   SpO2 97%   BMI 39.18 kg/m     Wt Readings from Last 3 Encounters:  12/13/22 214 lb 3.2 oz (97.2 kg)  11/01/22 210 lb 9.6 oz (95.5 kg)  05/22/22 203 lb (92.1 kg)     GEN: Well nourished, well developed in  no acute distress HEENT: Normal NECK: No JVD; No carotid bruits LYMPHATICS: No lymphadenopathy CARDIAC: S1S2 noted,RRR, no murmurs, rubs, gallops RESPIRATORY:  Clear to auscultation without rales, wheezing or rhonchi  ABDOMEN: Soft, non-tender, non-distended, +bowel sounds, no guarding. EXTREMITIES: No edema, No cyanosis, no clubbing MUSCULOSKELETAL:  No deformity  SKIN: Warm and dry NEUROLOGIC:  Alert and oriented x 3, non-focal PSYCHIATRIC:  Normal affect, good insight  ASSESSMENT:    1. Carotid artery disease, unspecified laterality, unspecified type   2. Obesity (BMI 30-39.9)   3. Primary hypertension   4. Mixed hyperlipidemia    PLAN:     Follow cardiovascular standpoint she is doing well she offers no complaints.  She has a history of carotid artery disease we will get a carotid ultrasound to monitor this with surveillance.  Blood pressure acceptable in the office today.  No changes to her antihypertensive medication.  The patient understands the need to lose weight with diet and exercise. We have discussed specific strategies for this.  The patient is in agreement with the above plan. The patient left the office in stable condition.  The patient will follow up in 1 year or sooner   Medication Adjustments/Labs and Tests Ordered: Current medicines are reviewed at length with the patient today.  Concerns regarding medicines are outlined above.  Orders Placed This Encounter  Procedures   VAS US CAROTID   No orders of the defined types were placed in this encounter.   Patient  Instructions  Medication Instructions:  Your physician recommends that you continue on your current medications as directed. Please refer to the Current Medication list given to you today.  *If you need a refill on your cardiac medications before your next appointment, please call your pharmacy*   Lab Work: None   Testing/Procedures: Your physician has requested that you have a carotid duplex. This test is an ultrasound of the carotid arteries in your neck. It looks at blood flow through these arteries that supply the brain with blood. Allow one hour for this exam. There are no restrictions or special instructions.    Follow-Up: At [facility], you and your health needs are our priority.  As part of our continuing mission to provide you with exceptional heart care, we have created designated Provider Care Teams.  These Care Teams include your primary Cardiologist (physician) and Advanced Practice Providers (APPs -  Physician Assistants and Nurse Practitioners) who all work together to provide you with the care you need, when you need it.  Your next appointment:   1 year(s)  Provider:   Thomasene Ripple, DO      Adopting a Healthy Lifestyle.  Know what a healthy weight is for you (roughly BMI <25) and aim to maintain this   Aim for 7+ servings of fruits and vegetables daily   65-80+ fluid ounces of water or unsweet tea for healthy kidneys   Limit to max 1 drink of alcohol per day; avoid smoking/tobacco   Limit animal fats in diet for cholesterol and heart health - choose grass fed whenever available   Avoid highly processed foods, and foods high in saturated/trans fats   Aim for low stress - take time to unwind and care for your mental health   Aim for 150 min of moderate intensity exercise weekly for heart health, and weights twice weekly for bone health   Aim for 7-9 hours of sleep daily   When it comes to diets, agreement about the perfect plan isnt  easy to find,  even among the experts. Experts at the [facility] [employer] developed an idea known as the Healthy Eating Plate. Just imagine a plate divided into logical, healthy portions.   The emphasis is on diet quality:   Load up on vegetables and fruits - one-half of your plate: Aim for color and variety, and remember that potatoes dont count.   Go for whole grains - one-quarter of your plate: Whole wheat, barley, wheat berries, quinoa, oats, brown rice, and foods made with them. If you want pasta, go with whole wheat pasta.   Protein power - one-quarter of your plate: Fish, chicken, beans, and nuts are all healthy, versatile protein sources. Limit red meat.   The diet, however, does go beyond the plate, offering a few other suggestions.   Use healthy plant oils, such as olive, canola, soy, corn, sunflower and peanut. Check the labels, and avoid partially hydrogenated oil, which have unhealthy trans fats.   If youre thirsty, drink water. Coffee and tea are good in moderation, but skip sugary drinks and limit milk and dairy products to one or two daily servings.   The type of carbohydrate in the diet is more important than the amount. Some sources of carbohydrates, such as vegetables, fruits, whole grains, and beans-are healthier than others.   Finally, stay active  Signed, Thomasene Ripple, DO  12/14/2022 9:05 AM    [facility]

## 2022-12-19 ENCOUNTER — Encounter: Payer: Self-pay | Admitting: Psychology

## 2022-12-19 ENCOUNTER — Ambulatory Visit (INDEPENDENT_AMBULATORY_CARE_PROVIDER_SITE_OTHER): Payer: 59 | Admitting: Psychology

## 2022-12-19 ENCOUNTER — Ambulatory Visit: Payer: 59

## 2022-12-19 DIAGNOSIS — G3184 Mild cognitive impairment, so stated: Secondary | ICD-10-CM | POA: Insufficient documentation

## 2022-12-19 DIAGNOSIS — I679 Cerebrovascular disease, unspecified: Secondary | ICD-10-CM

## 2022-12-19 DIAGNOSIS — F411 Generalized anxiety disorder: Secondary | ICD-10-CM | POA: Diagnosis not present

## 2022-12-19 DIAGNOSIS — F330 Major depressive disorder, recurrent, mild: Secondary | ICD-10-CM | POA: Diagnosis not present

## 2022-12-19 DIAGNOSIS — R4189 Other symptoms and signs involving cognitive functions and awareness: Secondary | ICD-10-CM

## 2022-12-19 DIAGNOSIS — K219 Gastro-esophageal reflux disease without esophagitis: Secondary | ICD-10-CM | POA: Insufficient documentation

## 2022-12-19 HISTORY — DX: Mild cognitive impairment of uncertain or unknown etiology: G31.84

## 2022-12-19 NOTE — Progress Notes (Signed)
NEUROPSYCHOLOGICAL EVALUATION [place]. [facility] Department of Neurology  Date of Evaluation: December 19, 2022  Reason for Referral:   Samantha Luna is a 62 y.o. right-handed African-American female referred by Patrcia Dolly, M.D., to characterize her current cognitive functioning and assist with diagnostic clarity and treatment planning in the context of subjective cognitive decline.   Assessment and Plan:   Clinical Impression(s): Samantha Luna pattern of performance is suggestive of an isolated impairment surrounding receptive language. Additional relative weaknesses were exhibited across complex attention and encoding (i.e., learning) aspects of verbal memory, while performance variability was exhibited across executive functioning. Performances were appropriate relative to age-matched peers across processing speed, basic attention, expressive language, visuospatial abilities, and delayed retrieval/consolidation aspects of memory. Samantha Luna largely denied difficulties completing instrumental activities of daily living (ADLs) independently. Her daughter did express some concerns, particularly surrounding trouble remembering to take medication doses from time to time, as well as instances where bills went unpaid and consequences ensued (e.g., vehicle tags being cancelled). Overall, I feel that Samantha Luna best meets criteria for a Mild Neurocognitive Disorder ("mild cognitive impairment"). The mild nature of this presentation should be highlighted at the present time based upon current testing.   Relative to her previous evaluation in May 2023, her greatest decline was exhibited across receptive language (i.e., comprehension or understanding what is being spoken). Very mild decline could also be argued across complex attention and response inhibition. Other assessed cognitive domains exhibited relative stability over the past 11 months.  The etiology of ongoing dysfunction continues  to be difficult to pinpoint. There remains the potential for a prominent cerebrovascular contribution given that neuroimaging has revealed mild to moderate microvascular ischemic disease (more extensive in the pons). While Samantha Luna did not report significant symptoms of anxiety and depression across mood-related questionnaires, she strongly alluded to numerous ongoing family dynamics which are likely the source of quite significant distress during interview. Both of these factors, especially in combination, could reasonably create Samantha Luna's non-specific pattern of dysfunction across testing.  Despite some trouble learning information efficiently, Ms. Bechtold continues to be able to adequately retain learned knowledge after lengthy delays. Overall, memory performance combined with intact performances across other areas of cognitive functioning is not suggestive of symptomatic Alzheimer's disease. Likewise, her cognitive and behavioral profile is not suggestive of any other form of neurodegenerative illness presently.  Recommendations: A repeat neuropsychological evaluation in 24-36 months (or sooner if functional decline is noted) is recommended to assess the trajectory of future cognitive decline should it occur. This will also aid in future efforts towards improved diagnostic clarity.   A combination of medication and psychotherapy has been shown to be most effective at treating symptoms of anxiety and depression. As such, Samantha Luna is encouraged to speak with her prescribing physician regarding medication adjustments to optimally manage these symptoms.    Likewise, Samantha Luna is encouraged to consider engaging in short-term psychotherapy to address symptoms of psychiatric distress. She would benefit from an active and collaborative therapeutic environment, rather than one purely supportive in nature. Recommended treatment modalities include Cognitive Behavioral Therapy (CBT) or Acceptance and Commitment  Therapy (ACT).  A lumbar puncture continues to be a reasonable diagnostic tool given Samantha Luna's relative youth and concerns held by her family. Should she change her mind about cancelling this procedure, she will likely need to contact Samantha Luna or Dr. Karel Jarvis.   Performance across neurocognitive testing is not a strong predictor of an individual's safety  operating a motor vehicle. Should her family wish to pursue a formalized driving evaluation, they could reach out to the following agencies: [employer] in [place]: [phone] [facility]: [phone] [facility]: [phone] [facility]: [phone] or [phone]  Should there be progression of current deficits over time, Samantha Luna is unlikely to regain any independent living skills lost. Therefore, it is recommended that she remain as involved as possible in all aspects of household chores, finances, and medication management, with supervision to ensure adequate performance. She will likely benefit from the establishment and maintenance of a routine in order to maximize her functional abilities over time.  Samantha Luna is encouraged to attend to lifestyle factors for brain health (e.g., regular physical exercise, good nutrition habits and consideration of the MIND-DASH diet, regular participation in cognitively-stimulating activities, and general stress management techniques), which are likely to have benefits for both emotional adjustment and cognition. In fact, in addition to promoting good general health, regular exercise incorporating aerobic activities (e.g., brisk walking, jogging, cycling, etc.) has been demonstrated to be a very effective treatment for depression and stress, with similar efficacy rates to both antidepressant medication and psychotherapy. Optimal control of vascular risk factors (including safe cardiovascular exercise and adherence to dietary recommendations) is encouraged.  Likewise, continued compliance with her CPAP machine will also be important. Continued participation in activities which provide mental stimulation and social interaction is also recommended.   Memory can be improved using internal strategies such as rehearsal, repetition, chunking, mnemonics, association, and imagery. External strategies such as written notes in a consistently used memory journal, visual and nonverbal auditory cues such as a calendar on the refrigerator or appointments with alarm, such as on a cell phone, can also help maximize recall.    When learning new information, she would benefit from information being broken up into small, manageable pieces. She may also find it helpful to articulate the material in her own words and in a context to promote encoding at the onset of a new task. This material may need to be repeated multiple times to promote encoding.  To address problems with fluctuating attention and/or executive dysfunction, she may wish to consider:   -Avoiding external distractions when needing to concentrate   -Limiting exposure to fast paced environments with multiple sensory demands   -Writing down complicated information and using checklists   -Attempting and completing one task at a time (i.e., no multi-tasking)   -Verbalizing aloud each step of a task to maintain focus   -Taking frequent breaks during the completion of steps/tasks to avoid fatigue   -Reducing the amount of information considered at one time   -Scheduling more difficult activities for a time of day where she is usually most alert  Review of Records:   Samantha Luna was seen by [facility] Marlowe Kays, PA-C) on 08/10/2021 for an evaluation of memory loss. At that time, concerns were said to be present for the past year and appeared to first become noticeable following the passing of her sister. She reported repeating the same stories, asking repetitive questions, and entering rooms and forgetting  her original intention. She also described some word finding difficulties. Her daughter reported her mother experiencing significant grief after several close family members passed, including her sister in June 2021. Her daughter reported a prior coping mechanism of bottling these emotions up. However, Samantha Luna was in the process of seeking therapeutic help to process these emotions. Samantha Luna lives alone and denied any  trouble with ADLs. There is a history of sleep apnea with variable CPAP adherence due to ongoing mask discomfort. There was also report of ongoing chronic pain. Performance on a brief cognitive screening instrument (MOCA) was 23/30. Ultimately, Samantha Luna was referred for a comprehensive neuropsychological evaluation to characterize her cognitive abilities and to assist with diagnostic clarity and treatment planning.   She completed a comprehensive neuropsychological evaluation with myself on 01/24/2022. Results suggested neuropsychological functioning largely within normal limits relative to age-matched peers. A primary weakness was exhibited encoding (i.e., learning) novel verbal information. Additional performance variability was exhibited across executive functioning. Performances were appropriate across processing speed, attention/concentration, safety/judgment, receptive and expressive language, visuospatial abilities, and delayed retrieval/consolidation aspects of memory. A vascular contribution was theorized given mild to moderate microvascular ischemic changes. Ms. Devincent also endorsed mild to moderate psychiatric distress (i.e., anxiety and depression) across related questionnaires, as well as mild sleep dysfunction and ongoing chronic pain. These variables, especially when combined together, can create and/or worsen cognitive inefficiencies. Strong concerns for underlying Alzheimer's disease were not expressed at that time.  She most recently met with [facility] Patrcia Dolly, M.D.)  on 11/01/2022 for follow-up. Her daughter reported mildly progressive short-term memory dysfunction. She also noted that Ms. Garner has trouble forgetting to take medications at times, as well as had some forgotten or lapsed bills. Ultimately, Ms. Hitchman was referred for a comprehensive neuropsychological evaluation to characterize her cognitive abilities and to assist with diagnostic clarity and treatment planning.    Brain MRI on 09/08/2021 revealed mild microvascular ischemic changes, with more moderate severity specifically in the pons, as well as a 13 mm dural based meningioma located in the left occipital pole. Brain MRI on 10/16/2022 was relatively stable. Extensive microvascular disease was noted in the pons, with mild ischemia changes across the cerebral hemisphere white matter more broadly. Her meningioma was measured at 14 x 14 x 11 mm and was without evidence for enhancement. A lumbar puncture had been ordered but not yet completed at the time of the current evaluation.  Past Medical History:  Diagnosis Date   Acute exacerbation of chronic obstructive airways disease 11/09/2017   Hospitalization 11/09/17 in [place] - See Care Everywhere   Allergic contact dermatitis 09/12/2021   Atherosclerosis of native arteries of extremities with intermittent claudication, right leg 08/11/2019   Bell's palsy    Chest pain 05/07/2010   Complication of anesthesia    hysterectomy - slow to wake up   Congestive heart failure 04/26/2010   Echo 11/13/20 in [place] showed normal ejection fraction, normal echo.    COPD (chronic obstructive pulmonary disease) 02/11/2016   Dyspnea    Essential hypertension 08/25/2021   Generalized anxiety disorder 01/06/2022   GERD (gastroesophageal reflux disease)    High cholesterol 02/11/2016   History of COVID-19 07/20/2020   Laryngopharyngeal reflux 08/14/2018   Major depressive disorder    Meningioma 04/23/2010   01/28/13 Head CT: A left posterior parafalcine meningioma is  seen that measures 1.5 cm,  previously measuring 1.3 cm. Basal cisterns and sulci are unremarkable. Impression:  Left posterior parafalcine meningioma is mildly increased in size since  prior without significant surrounding mass effect or acute process visualized.   Acute or subacute ischemia is not excluded with CT.   Mild persistent asthma 05/01/2018   Obstructive sleep apnea 08/18/2021   variable CPAP use   Osteoarthritis of right knee 11/10/2020   Peripheral vascular disease    Pneumonia 2020   Pre-diabetes  Restrictive lung disease 03/05/2018   Subjective memory complaints 08/18/2021    Past Surgical History:  Procedure Laterality Date   ABDOMINAL HYSTERECTOMY  2017   meniscus repair on right knee  Right 2017   TONSILLECTOMY     as a child   TOTAL KNEE ARTHROPLASTY Left 10/03/2021   Procedure: TOTAL KNEE ARTHROPLASTY;  Surgeon: Ollen Gross, MD;  Location: [facility];  Service: Orthopedics;  Laterality: Left;   TOTAL KNEE ARTHROPLASTY Right 05/22/2022   Procedure: TOTAL KNEE ARTHROPLASTY;  Surgeon: Ollen Gross, MD;  Location: [facility];  Service: Orthopedics;  Laterality: Right;    Current Outpatient Medications:    albuterol (VENTOLIN HFA) 108 (90 Base) MCG/ACT inhaler, Inhale 1-2 puffs into the lungs every 6 (six) hours as needed for wheezing or shortness of breath., Disp: , Rfl:    aspirin EC 81 MG tablet, Take 81 mg by mouth daily. Swallow whole., Disp: , Rfl:    escitalopram (LEXAPRO) 10 MG tablet, Take 10 mg by mouth daily., Disp: , Rfl:    fluticasone (FLOVENT HFA) 220 MCG/ACT inhaler, Inhale 2 puffs into the lungs 2 (two) times daily., Disp: , Rfl:    ibandronate (BONIVA) 150 MG tablet, Take 150 mg by mouth every 30 (thirty) days., Disp: , Rfl:    loratadine (CLARITIN) 10 MG tablet, Take 10 mg by mouth daily as needed for allergies., Disp: , Rfl:    losartan-hydrochlorothiazide (HYZAAR) 100-25 MG tablet, Take 1 tablet by mouth daily., Disp: , Rfl:    Melatonin-Pyridoxine  (MELATIN PO), Take by mouth., Disp: , Rfl:    Multiple Vitamins-Minerals (MULTIVITAMIN WITH MINERALS) tablet, Take 1 tablet by mouth daily., Disp: , Rfl:    MYRBETRIQ 50 MG TB24 tablet, Take 50 mg by mouth every other day., Disp: , Rfl:    nitroGLYCERIN (NITROSTAT) 0.4 MG SL tablet, Place 0.4 mg under the tongue every 5 (five) minutes as needed for chest pain. (Patient not taking: Reported on 12/13/2022), Disp: , Rfl:    omeprazole (PRILOSEC) 20 MG capsule, Take 20 mg by mouth daily., Disp: , Rfl:    rosuvastatin (CRESTOR) 5 MG tablet, Take 5 mg by mouth daily., Disp: , Rfl:    traMADol (ULTRAM) 50 MG tablet, Take 1-2 tablets (50-100 mg total) by mouth every 6 (six) hours as needed for moderate pain., Disp: 40 tablet, Rfl: 0  Clinical Interview:   The following information was obtained during a clinical interview with Ms. Resh and her daughter prior to cognitive testing.  Cognitive Symptoms: Decreased short-term memory: Largely denied. Previously, Ms. Lafata did acknowledge "maybe a little" difficulty recalling conversations but emphasized that these were not out of the ordinary. This was said to be stable from her perspective. Her daughter had previously emphasized Ms. Calabria being repetitive in conversation or not recalling details of recently held conversations. Difficulties were said to be present for the past few years and do seem to have progressively worsened since her May 2023 evaluation.  Decreased long-term memory: Denied. Decreased attention/concentration: Denied. Her daughter noted that she and Ms. Gohr have had conversations where her mother will report trouble with increased distractibility. She also would describe instances where she would lost her train of thought or enter rooms and forget her original intention.  Reduced processing speed: Endorsed. Her daughter added that there are also times where Ms. Lukasiewicz has described her mind as racing and that she "can't turn it off."  Difficulties  with executive functions: Denied. Ms. Capano denied trouble with impulsivity or any  significant personality changes. Her daughter previously stated that her mother had seemed more easily agitated and irritable. The later was again emphasized with her daughter currently, suggesting that Ms. Romano having a very short fuse has intensified during the past year.  Difficulties with emotion regulation: Denied. Difficulties with receptive language: Denied. Difficulties with word finding: Endorsed. Decreased visuoperceptual ability: Denied.   Difficulties completing ADLs: Somewhat. Ms. Benda continues to live alone. She acknowledged instances where she may forget to take evening doses of medications once per week, as well as a few isolated times where she has forgotten a to pay a bill. She also alluded to some trouble with navigation and getting lost, attributing this to her still being relatively new in the area and still learning the roads. Her daughter also reported Ms. Caldera having trouble with medication management in that she may forget to take pills from time to time. Her daughter noted that her mother forgot to pay her car insurance to the extent that her coverage lapsed and her vehicle tags were cancelled. At that time of her previous May 2023 evaluation, there was also report of bills being paid late. Her daughter noted that Ms. Raybuck has gotten lost driving to her home and other familiar areas. She also noted that Ms. Cashaw was briefly lost while coming to the current appointment. At that time of her previous evaluation, there was an isolated report of Ms. Allington accidentally turning onto an incoming traffic on-ramp. Ms. Krigbaum stated that this was due to her being unfamiliar with the area and these ramps being constructed different here relative to [place] where she had recently moved from.   Additional Medical History: History of traumatic brain injury/concussion: Denied. History of stroke: Denied. History of  seizure activity: Denied. History of known exposure to toxins: Denied. Symptoms of chronic pain: Denied. She had undergone a bilateral knee replacement procedure since her previous evaluation and denied ongoing knee pain.  Experience of frequent headaches/migraines: Denied. Frequent instances of dizziness/vertigo: Denied.   Sensory changes: She wears glasses with benefit. Other sensory changes/difficulties (e.g., hearing, taste, smell) were denied.  Balance/coordination difficulties: Endorsed. Balance and stability was improved relative to her previous evaluation stemming from her knee replacement procedure. She continued to report some relatively mild instability stemming from her needing to "learn how to walk all over again."  Other motor difficulties: Denied.  Sleep History: Estimated hours obtained each night: 5-6 hours.  Difficulties falling asleep: Denied. Difficulties staying asleep: Endorsed. Feels rested and refreshed upon awakening: Variably so depending on the quality and quantity of sleep she obtained the night before.    History of snoring: Endorsed. History of waking up gasping for air: Endorsed. Witnessed breath cessation while asleep: Endorsed. She acknowledged a prior diagnosis of obstructive sleep apnea and described regular attempts to use her CPAP machine. She highlighted that there are instances where she may fall asleep earlier than expected and not use the device for most of the night. However, she stated that she aims to use the device nightly.   History of vivid dreaming: Denied. Excessive movement while asleep: Denied. Instances of acting out her dreams: Denied.  Psychiatric/Behavioral Health History: Depression: Previously, Ms. Noyes's daughter stated that her mother carries a prior diagnosis of major depressive disorder and has been prescribed medication to treat these symptoms in the past. Currently, Ms. Cappucci was quite vague when describing ongoing depressive  symptoms. At one point, she stated an active desire to not go into detail surrounding her feelings  due to her likely becoming very emotional. Her daughter also alluded to Ms. August having a strong tendency to "push everything under the rug." At one point, Ms. Graven alluded to some family distress, potentially stemming from hurtful things said to or by her daughters. Her daughter separately stated that Samantha Luna said some hurtful things but does not recall this happening, so she has a limited understanding of how and why feelings had been hurt. She did not appear to have established care with a counselor/therapist despite prior recommendations. Current or remote suicidal ideation, intent, or plan was denied.  Anxiety: Denied. Mania: Denied. Her daughter did describe periods of mood swings where Ms. Steichen can be "off the charts" and then down or more depressed. She also described periods of increased impulsivity and much greater irritability. However, there has never been a formal evaluation for manic or hypomanic symptoms to my knowledge.  Trauma History: Denied. Visual/auditory hallucinations: Denied. Delusional thoughts: Denied.   Tobacco: Denied. Alcohol: She reported very rare alcohol consumption and denied a history of problematic alcohol abuse or dependence.  Recreational drugs: Denied.   Family History: Problem Relation Age of Onset   Memory loss Mother    Dementia Father    Asthma Sister    Immunodeficiency Daughter    Asthma Daughter    Anxiety disorder Daughter    Depression Daughter    Depression Daughter    Asthma Daughter    Bipolar disorder Daughter    Immunodeficiency Grandson    Asthma Grandson    This information was confirmed by Ms. Renato Gails.  Academic/Vocational History: Highest level of educational attainment: 12 years. She graduated from high school and described herself as an average (largely B/C) student in academic settings. Math was noted as a relative weakness.  History  of developmental delay: Denied. History of grade repetition: Denied. Enrollment in special education courses: Denied. History of LD/ADHD: Denied.   Employment: Retired. 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   Evaluation Results:   Behavioral Observations: Ms. Toelle was accompanied by her daughter, arrived to her appointment on time, and was appropriately dressed and groomed. She appeared alert and oriented. Observed gait and station were within normal limits. Gross motor functioning appeared intact upon informal observation and no abnormal movements (e.g., tremors) were noted. Her affect was generally relaxed and positive. However, she did appear to become defensive relatively quickly whenever her daughter attempted to provide her perception of ongoing difficulties. Her daughter had to be interview separately to limit acute frustration and mood changes immediately prior to testing. Spontaneous speech was fluent and word finding difficulties were not observed during the clinical interview. Thought processes were coherent, organized, and normal in content. Insight into her cognitive difficulties appeared adequate.   During testing, sustained attention was appropriate. Task engagement was adequate and she persisted when challenged. Overall, Ms. Larabee was cooperative with the clinical interview and subsequent testing procedures.   Adequacy of Effort: The validity of neuropsychological testing is limited by the extent to which the individual being tested may be assumed to have exerted adequate effort during testing. Ms. Vangorden expressed her intention to perform to the best of her abilities and exhibited adequate task engagement and persistence. Scores across stand-alone and embedded performance validity measures were within expectation. As such, the results of the current evaluation are believed to be a valid representation of Ms. Walgren's current cognitive  functioning.  Test Results: Ms. Hearn was largely oriented at  the time of the current evaluation. She was one day off when stating the current date and day of the week.   Intellectual abilities based upon educational and vocational attainment were estimated to be in the average range. Premorbid abilities were estimated to be within the well below average range based upon a single-word reading test.   Processing speed was variable but overall appropriate, ranging from the below average to above average normative ranges. Basic attention was average. More complex attention (e.g., working memory) was well below average to below average. Executive functioning was variable, ranging from the exceptionally low to above average normative ranges. She performed in the average range across a task assessing safety and judgment.  Assessed receptive language abilities were well below average. Points were lost on this task due to difficulty sequencing commands (e.g., point to X after pointing to Y), as well as trouble understanding more complex sentence structure. Despite this, Ms. Jansson did not exhibit any difficulties comprehending task instructions and answered all questions asked of her appropriately during interview. Assessed expressive language (e.g., verbal fluency and confrontation naming) was below average to average.     Assessed visuospatial/visuoconstructional abilities were below average to average.    Learning (i.e., encoding) of novel verbal information was well below average to below average. Spontaneous delayed recall (i.e., retrieval) of previously learned information was below average to average. Retention rates were 100% across a story learning task, 57% across a list learning task, and 42% across a figure drawing task. Performance across recognition tasks was appropriate, suggesting evidence for information consolidation.   Results of emotional screening instruments suggested that recent symptoms  of generalized anxiety were in the minimal range, while symptoms of depression were within the mild range. A screening instrument assessing recent sleep quality suggested the presence of minimal sleep dysfunction.  Tables of Scores:   Note: This summary of test scores accompanies the interpretive report and should not be considered in isolation without reference to the appropriate sections in the text. Descriptors are based on appropriate normative data and may be adjusted based on clinical judgment. Terms such as "Within Normal Limits" and "Outside Normal Limits" are used when a more specific description of the test score cannot be determined. Descriptors refer to the current evaluation only.         Percentile - Normative Descriptor > 98 - Exceptionally High 91-97 - Well Above Average 75-90 - Above Average 25-74 - Average 9-24 - Below Average 2-8 - Well Below Average < 2 - Exceptionally Low        Orientation: May 2023 Current     Raw Score Raw Score Percentile   NAB Orientation, Form 1 26/29 27/29 --- ---        Cognitive Screening:       Raw Score Raw Score Percentile   SLUMS: 24/30 21/30 --- ---        RBANS, Form A: Standard Score/ Scaled Score Standard Score/ Scaled Score Percentile   Total Score 87 86 18 Below Average  Immediate Memory 73 76 5 Well Below Average    List Learning Well Below Average    Story Memory Below Average  Visuospatial/Constructional 89 109 73 Average    Figure Copy 8 11 63 Average    Line Orientation 16/20 18/20 51-75 Average  Language 92 92 30 Average    Picture Naming 10/10 10/10 51-75 Average    Semantic Fluency Below Average  Attention 103  94 34 Average    Digit Span 14 11 63 Average    Coding 7 7 16  Below Average  Delayed Memory 98 81 10 Below Average    List Recall 6/10 4/10 26-50 Average    List Recognition 19/20 18/20 10-16 Below Average    Story Recall 7 9 37 Average    Story Recognition 9/12 9/12 16-26 Below  Average to Average    Figure Recall 8 6 9  Below Average    Figure Recognition 7/8 7/8 53-69 Average         Intellectual Functioning:       Standard Score Standard Score Percentile   Barona Formula Estimated Premorbid IQ: --- 92 30 Average         Standard Score Standard Score Percentile   Test of Premorbid Functioning: 73 74 4 Well Below Average        Attention/Executive Function:      Trail Making Test (TMT): Raw Score (T Score) Raw Score (T Score) Percentile     Part A 31 secs.,  2 errors (56) 28 secs.,  3 errors (60) 84 Above Average    Part B 78 secs.,  2 errors (58) 72 secs.,  0 errors (62) 88 Above Average          Scaled Score Scaled Score Percentile   WAIS-IV Digit Span: 8 5 5  Well Below Average    Forward 10 8 25  Average    Backward 7 5 5  Well Below Average    Sequencing 7 6 9  Below Average         Scaled Score Scaled Score Percentile   WAIS-IV Similarities: 5 6 9  Below Average        D-KEFS Color-Word Interference Test: Raw Score (Scaled Score) Raw Score (Scaled Score) Percentile     Color Naming 35 secs. (9) 35 secs. (9) 37 Average    Word Reading 30 secs. (7) 32 secs. (6) 9 Below Average    Inhibition 70 secs. (10) 76 secs. (9) 37 Average      Total Errors 4 errors (8) 4 errors (8) 25 Average    Inhibition/Switching 97 secs. (7) 115 secs. (5) 5 Well Below Average      Total Errors 7 errors (6) 12 errors (1) <1 Exceptionally Low        D-KEFS Verbal Fluency Test: Raw Score (Scaled Score) Raw Score (Scaled Score) Percentile     Letter Total Correct 22 (6) 26 (7) 16 Below Average    Category Total Correct 33 (9) 29 (8) 25 Average    Category Switching Total Correct 13 (11) 12 (9) 37 Average    Category Switching Accuracy 10 (9) 10 (9) 37 Average      Total Set Loss Errors 0 (13) 1 (11) 63 Average      Total Repetition Errors 4 (9) 6 (7) 16 Below Average        NAB Executive Functions Module, Form 1: T Score T Score Percentile     Judgment 48 48 42  Average        Language:      Verbal Fluency Test: Raw Score (T Score) Raw Score (T Score) Percentile     Phonemic Fluency (FAS) 22 (42) 26 (45) 31 Average    Animal Fluency 15 (51) 13 (43) 25 Average         NAB Language Module, Form 1: T Score T Score Percentile     Auditory Comprehension 43 30 2 Well Below  Average    Naming 28/31 (39) 29/31 (45) 31 Average        Visuospatial/Visuoconstruction:       Raw Score Raw Score Percentile   Clock Drawing: 9/10 10/10 --- Within Normal Limits         Scaled Score Scaled Score Percentile   WAIS-IV Block Design: 8 7 16  Below Average  WAIS-IV Matrix Reasoning: 6 6 9  Below Average        Mood and Personality:       Raw Score Raw Score Percentile   Beck Depression Inventory - II: 19 16 --- Mild  PROMIS Anxiety Questionnaire: 22 15 --- None to Slight        Additional Questionnaires:       Raw Score Raw Score Percentile   PROMIS Sleep Disturbance Questionnaire: 26 18 --- None to Slight   Informed Consent and Coding/Compliance:   The current evaluation represents a clinical evaluation for the purposes previously outlined by the referral source and is in no way reflective of a forensic evaluation.   Ms. Diguglielmo was provided with a verbal description of the nature and purpose of the present neuropsychological evaluation. Also reviewed were the foreseeable risks and/or discomforts and benefits of the procedure, limits of confidentiality, and mandatory reporting requirements of this provider. The patient was given the opportunity to ask questions and receive answers about the evaluation. Oral consent to participate was provided by the patient.   This evaluation was conducted by Newman Nickels, Ph.D., ABPP-CN, board certified clinical neuropsychologist. Ms. Estell completed a clinical interview with Dr. Milbert Coulter, billed as one unit [phone], and 145 minutes of cognitive testing and scoring, billed as one unit [phone] and four additional units [id number].  Psychometrist Shan Levans, B.S., assisted Dr. Milbert Coulter with test administration and scoring procedures. As a separate and discrete service, Dr. Milbert Coulter spent a total of 160 minutes in interpretation and report writing billed as one unit [phone] and two units [id number].

## 2022-12-19 NOTE — Progress Notes (Signed)
   Psychometrician Note   Cognitive testing was administered to Samantha Luna by Shan Levans, B.S. (psychometrist) under the supervision of Dr. Newman Nickels, Ph.D., licensed psychologist on 12/19/2022. Samantha Luna did not appear overtly distressed by the testing session per behavioral observation or responses across self-report questionnaires. Rest breaks were offered.    The battery of tests administered was selected by Dr. Newman Nickels, Ph.D. with consideration to Samantha Luna's current level of functioning, the nature of her symptoms, emotional and behavioral responses during interview, level of literacy, observed level of motivation/effort, and the nature of the referral question. This battery was communicated to the psychometrist. Communication between Dr. Newman Nickels, Ph.D. and the psychometrist was ongoing throughout the evaluation and Dr. Newman Nickels, Ph.D. was immediately accessible at all times. Dr. Newman Nickels, Ph.D. provided supervision to the psychometrist on the date of this service to the extent necessary to assure the quality of all services provided.    Samantha Luna will return within approximately 1-2 weeks for an interactive feedback session with Dr. Milbert Coulter at which time her test performances, clinical impressions, and treatment recommendations will be reviewed in detail. Samantha Luna understands she can contact our office should she require our assistance before this time.  A total of 145 minutes of billable time were spent face-to-face with Samantha Luna by the psychometrist. This includes both test administration and scoring time. Billing for these services is reflected in the clinical report generated by Dr. Newman Nickels, Ph.D.  This note reflects time spent with the psychometrician and does not include test scores or any clinical interpretations made by Dr. Milbert Coulter. The full report will follow in a separate note.

## 2022-12-21 ENCOUNTER — Ambulatory Visit (INDEPENDENT_AMBULATORY_CARE_PROVIDER_SITE_OTHER): Payer: 59 | Admitting: Pulmonary Disease

## 2022-12-21 ENCOUNTER — Encounter: Payer: Self-pay | Admitting: Pulmonary Disease

## 2022-12-21 VITALS — BP 132/70 | HR 90 | Ht 62.0 in | Wt 215.0 lb

## 2022-12-21 DIAGNOSIS — G4733 Obstructive sleep apnea (adult) (pediatric): Secondary | ICD-10-CM | POA: Diagnosis not present

## 2022-12-21 MED ORDER — FLUTICASONE PROPIONATE HFA 220 MCG/ACT IN AERO: 2.0000 | INHALATION_SPRAY | Freq: Two times a day (BID) | RESPIRATORY_TRACT | 6 refills | Status: DC

## 2022-12-21 MED ORDER — ALBUTEROL SULFATE (2.5 MG/3ML) 0.083% IN NEBU: 2.5000 mg | INHALATION_SOLUTION | Freq: Four times a day (QID) | RESPIRATORY_TRACT | 3 refills | Status: DC | PRN

## 2022-12-21 MED ORDER — ALBUTEROL SULFATE HFA 108 (90 BASE) MCG/ACT IN AERS: 1.0000 | INHALATION_SPRAY | Freq: Four times a day (QID) | RESPIRATORY_TRACT | 3 refills | Status: DC | PRN

## 2022-12-21 MED ORDER — NYSTATIN 100000 UNIT/ML MT SUSP: 5.0000 mL | Freq: Four times a day (QID) | OROMUCOSAL | 0 refills | Status: DC

## 2022-12-21 MED ORDER — AEROCHAMBER HOLDING CHAMBER DEVI: 1.0000 | Freq: Once | 0 refills | Status: AC

## 2022-12-21 NOTE — Progress Notes (Signed)
Samantha Luna    [id number]    [birth year]  Primary Care Physician:Robinson, Marlana Latus, MD  Referring Physician: Verlon Au, MD [phone] [address] [place],  [place] [address]  Chief complaint:   Patient with a history of obstructive sleep apnea Has been trying to use CPAP nightly  HPI:  Tolerating CPAP well Does not remember to put it on every night but usually when she puts it on and wakes up in the morning she feels like she is at a good nights rest  Has some shortness of breath  Feels she may have a tongue infection from a inhaler -Request for something to treat this and also AeroChamber  Continues to be compliant with inhalers although does not have nebulization medications at present  She is in the gym on a regular basis Does water aerobics  Benefit from CPAP when she is able to use it on a regular basis -She is trying to use it regularly  Most recent home sleep study on 12/23/2021 with an AHI of 6  Post knee surgery which she tolerated well-left knee  She currently she recollects that the study showed that she stop breathing about 11 times an hour -Recent study shows AHI of 6  [place] Used CPAP regularly and then got away from using it Usually goes to bed about 7 PM, final wake up time about 4 AM, she does have about 1 or 2 awakenings Denies any headaches in the morning She does have dryness of her mouth in the mornings No family history of sleep apnea known to her She does have a history of hypertension, has had a negative stress test recently, does have a history of angina  PFT from 2020 did reveal mild obstructive disease, mild restriction  Outpatient Encounter Medications as of 12/21/2022  Medication Sig   albuterol (VENTOLIN HFA) 108 (90 Base) MCG/ACT inhaler Inhale 1-2 puffs into the lungs every 6 (six) hours as needed for wheezing or shortness of breath.   aspirin EC 81 MG tablet Take 81 mg by mouth daily. Swallow whole.    escitalopram (LEXAPRO) 10 MG tablet Take 10 mg by mouth daily.   fluticasone (FLOVENT HFA) 220 MCG/ACT inhaler Inhale 2 puffs into the lungs 2 (two) times daily.   ibandronate (BONIVA) 150 MG tablet Take 150 mg by mouth every 30 (thirty) days.   loratadine (CLARITIN) 10 MG tablet Take 10 mg by mouth daily as needed for allergies.   losartan-hydrochlorothiazide (HYZAAR) 100-25 MG tablet Take 1 tablet by mouth daily.   Melatonin-Pyridoxine (MELATIN PO) Take by mouth.   Multiple Vitamins-Minerals (MULTIVITAMIN WITH MINERALS) tablet Take 1 tablet by mouth daily.   MYRBETRIQ 50 MG TB24 tablet Take 50 mg by mouth every other day.   nitroGLYCERIN (NITROSTAT) 0.4 MG SL tablet Place 0.4 mg under the tongue every 5 (five) minutes as needed for chest pain.   omeprazole (PRILOSEC) 20 MG capsule Take 20 mg by mouth daily.   rosuvastatin (CRESTOR) 5 MG tablet Take 5 mg by mouth daily.   traMADol (ULTRAM) 50 MG tablet Take 1-2 tablets (50-100 mg total) by mouth every 6 (six) hours as needed for moderate pain.   No facility-administered encounter medications on file as of 12/21/2022.    Allergies as of 12/21/2022 - Review Complete 12/21/2022  Allergen Reaction Noted   Iodine Other (See Comments) 05/16/2021   Codeine Itching 05/16/2021    Past Medical History:  Diagnosis Date   Acute exacerbation of chronic obstructive airways disease 11/09/2017   Hospitalization 11/09/17 in [place] - See Care Everywhere   Allergic contact dermatitis 09/12/2021   Atherosclerosis of native arteries of extremities with intermittent claudication, right leg 08/11/2019   Bell's palsy    Chest pain 05/07/2010   Complication of anesthesia    hysterectomy - slow to wake up   Congestive heart failure 04/26/2010   Echo 11/13/20 in [place] showed normal ejection fraction, normal echo.    COPD (chronic obstructive pulmonary disease) 02/11/2016   Dyspnea    Essential hypertension 08/25/2021   Generalized anxiety disorder  01/06/2022   GERD (gastroesophageal reflux disease)    High cholesterol 02/11/2016   History of COVID-19 07/20/2020   Laryngopharyngeal reflux 08/14/2018   Major depressive disorder    Meningioma 04/23/2010   01/28/13 Head CT: A left posterior parafalcine meningioma is seen that measures 1.5 cm,  previously measuring 1.3 cm. Basal cisterns and sulci are unremarkable. Impression:  Left posterior parafalcine meningioma is mildly increased in size since  prior without significant surrounding mass effect or acute process visualized.   Acute or subacute ischemia is not excluded with CT.   Mild cognitive impairment of uncertain or unknown etiology 12/19/2022   Mild persistent asthma 05/01/2018   Obstructive sleep apnea 08/18/2021   variable CPAP use   Osteoarthritis of right knee 11/10/2020   Peripheral vascular disease    Pneumonia 2020   Pre-diabetes    Restrictive lung disease 03/05/2018    Past Surgical History:  Procedure Laterality Date   ABDOMINAL HYSTERECTOMY  2017   meniscus repair on right knee  Right 2017   TONSILLECTOMY     as a child   TOTAL KNEE ARTHROPLASTY Left 10/03/2021   Procedure: TOTAL KNEE ARTHROPLASTY;  Surgeon: Ollen Gross, MD;  Location: [facility];  Service: Orthopedics;  Laterality: Left;   TOTAL KNEE ARTHROPLASTY Right 05/22/2022   Procedure: TOTAL KNEE ARTHROPLASTY;  Surgeon: Ollen Gross, MD;  Location: [facility];  Service: Orthopedics;  Laterality: Right;    Family History  Problem Relation Age of Onset   Memory loss Mother    Dementia Father    Asthma Sister    Immunodeficiency Daughter    Asthma Daughter    Anxiety disorder Daughter    Depression Daughter    Depression Daughter    Asthma Daughter    Bipolar disorder Daughter    Immunodeficiency Grandson    Asthma Grandson     Social History   Socioeconomic History   Marital status: Single    Spouse name: Not on file   Number of children: Not on file   Years of education: 12   Highest  education level: High school graduate  Occupational History   Occupation: Retired    Comment: Midwife  Tobacco Use   Smoking status: Former    Packs/day: 1.00    Years: 40.00    Additional pack years: 0.00    Total pack years: 40.00    Types: Cigarettes    Quit date: 08/28/2021    Years since quitting: 1.3   Smokeless tobacco: Never  Vaping Use   Vaping Use: Never used  Substance and Sexual Activity   Alcohol use: Not Currently    Comment: Very infrequent   Drug use: Not Currently    Types: Marijuana    Comment: Pt reports that a joint will last her several months   Sexual activity: Not Currently  Other Topics Concern  Not on file  Social History Narrative   Right handed    Social Determinants of Health   Financial Resource Strain: Not on file  Food Insecurity: No Food Insecurity (05/22/2022)   Hunger Vital Sign    Worried About Running Out of Food in the Last Year: Never true    Ran Out of Food in the Last Year: Never true  Transportation Needs: No Transportation Needs (05/22/2022)   PRAPARE - Administrator, Civil Service (Medical): No    Lack of Transportation (Non-Medical): No  Physical Activity: Not on file  Stress: Not on file  Social Connections: Not on file  Intimate Partner Violence: Not At Risk (05/22/2022)   Humiliation, Afraid, Rape, and Kick questionnaire    Fear of Current or Ex-Partner: No    Emotionally Abused: No    Physically Abused: No    Sexually Abused: No    Review of Systems  Constitutional:  Negative for fatigue and fever.  Respiratory:  Positive for shortness of breath.   Psychiatric/Behavioral:  Positive for sleep disturbance.     Vitals:   12/21/22 0946  BP: 132/70  Pulse: 90  SpO2: 97%      Physical Exam Constitutional:      Appearance: She is obese.  HENT:     Head: Normocephalic.     Right Ear: Tympanic membrane normal.     Nose: No congestion.     Mouth/Throat:     Mouth: Mucous membranes are moist.      Comments: Mallampati 4, crowded oropharynx, macroglossia Eyes:     Pupils: Pupils are equal, round, and reactive to light.  Cardiovascular:     Rate and Rhythm: Normal rate and regular rhythm.     Heart sounds: No murmur heard.    No friction rub.  Pulmonary:     Effort: No respiratory distress.     Breath sounds: No stridor. No wheezing or rhonchi.  Musculoskeletal:     Cervical back: No rigidity or tenderness.  Neurological:     Mental Status: She is alert.  Psychiatric:        Mood and Affect: Mood normal.      09/09/2021    9:00 AM  Results of the Epworth flowsheet  Sitting and reading 3  Watching TV 3  Sitting, inactive in a public place (e.g. a theatre or a meeting) 2  As a passenger in a car for an hour without a break 2  Lying down to rest in the afternoon when circumstances permit 3  Sitting and talking to someone 2  Sitting quietly after a lunch without alcohol 2  In a car, while stopped for a few minutes in traffic 0  Total score 17   Data Reviewed:  Most recent compliance data reviewed showing 68% compliance Average use of 4 hours CPAP of 6 with an EPR of 2 Residual AHI 1.9  A previous PFT from 2020 was brought in by her daughter showing mild obstructive disease, mild restrictive disease-reviewed  Assessment:   .  History of obstructive sleep apnea -Continue weight loss efforts -Encouraged to continue using CPAP -Try to use CPAP nightly  .  History of asthma -Continue Flovent -Continue albuterol Prescription will be sent to pharmacy  .  Possible Trausch -Will send in a prescription for nystatin  Plan/Recommendations:  .  Encouraged to continue using CPAP on a nightly basis .  Inhalers called into pharmacy for patient  .  Follow-up in 6 months  .  Encouraged to call with significant concerns    Virl Diamond MD [facility] 12/21/2022, 9:51 AM  CC: Verlon Au, MD

## 2022-12-21 NOTE — Patient Instructions (Signed)
Continue using CPAP every night I will see you back in 6 months  Prescriptions for nebulizer, AeroChamber, Flovent will be sent to pharmacy for you  Make sure you are getting about 6 to 8 hours of sleep every night  Continue regular exercises  Call us with significant concerns

## 2022-12-25 ENCOUNTER — Telehealth: Payer: Self-pay

## 2022-12-25 ENCOUNTER — Other Ambulatory Visit (HOSPITAL_COMMUNITY): Payer: Self-pay

## 2022-12-25 NOTE — Telephone Encounter (Signed)
PA request received via CMM for Fluticasone Propionate HFA 220MCG/ACT aerosol  PA has not been submitted due to question of alternatives failed.   Key: [id number]

## 2022-12-26 ENCOUNTER — Ambulatory Visit (HOSPITAL_COMMUNITY)
Admission: RE | Admit: 2022-12-26 | Discharge: 2022-12-26 | Disposition: A | Discharge status: Home / Self Care | Payer: 59 | Source: Ambulatory Visit | Attending: Cardiovascular Disease | Admitting: Cardiovascular Disease

## 2022-12-26 ENCOUNTER — Encounter: Payer: 59 | Admitting: Psychology

## 2022-12-26 DIAGNOSIS — I779 Disorder of arteries and arterioles, unspecified: Secondary | ICD-10-CM

## 2022-12-26 NOTE — Telephone Encounter (Signed)
Dr. Val Eagle please see previous message from PA team Patient has only ever used Flovent or Albuterol

## 2022-12-27 ENCOUNTER — Other Ambulatory Visit: Payer: Self-pay | Admitting: Pulmonary Disease

## 2022-12-27 ENCOUNTER — Ambulatory Visit (INDEPENDENT_AMBULATORY_CARE_PROVIDER_SITE_OTHER): Payer: 59 | Admitting: Psychology

## 2022-12-27 DIAGNOSIS — G3184 Mild cognitive impairment, so stated: Secondary | ICD-10-CM | POA: Diagnosis not present

## 2022-12-27 MED ORDER — ARNUITY ELLIPTA 200 MCG/ACT IN AEPB: 1.0000 | INHALATION_SPRAY | Freq: Every day | RESPIRATORY_TRACT | 3 refills | Status: DC

## 2022-12-27 NOTE — Telephone Encounter (Signed)
Prescription for Arnuity 200 sent to pharmacy in place of Flovent

## 2022-12-27 NOTE — Progress Notes (Signed)
   Neuropsychology Feedback Session Samantha Luna. [facility] [place] Department of Neurology  Reason for Referral:   Samantha Luna is a 62 y.o. right-handed African-American female referred by Patrcia Dolly, M.D., to characterize her current cognitive functioning and assist with diagnostic clarity and treatment planning in the context of subjective cognitive decline.   Feedback:   Samantha Luna completed a comprehensive neuropsychological evaluation on 12/19/2022. Please refer to that encounter for the full report and recommendations. Briefly, results suggested an isolated impairment surrounding receptive language. Additional relative weaknesses were exhibited across complex attention and encoding (i.e., learning) aspects of verbal memory, while performance variability was exhibited across executive functioning. Relative to her previous evaluation in May 2023, her greatest decline was exhibited across receptive language (i.e., comprehension or understanding what is being spoken). Very mild decline could also be argued across complex attention and response inhibition. Other assessed cognitive domains exhibited relative stability over the past 11 months. The etiology of ongoing dysfunction continues to be difficult to pinpoint. There remains the potential for a prominent cerebrovascular contribution given that neuroimaging has revealed mild to moderate microvascular ischemic disease (more extensive in the pons). While Samantha Luna did not report significant symptoms of anxiety and depression across mood-related questionnaires, she strongly alluded to numerous ongoing family dynamics which are likely the source of quite significant distress during interview. Both of these factors, especially in combination, could reasonably create Samantha Luna's non-specific pattern of dysfunction across testing.   Samantha Luna was accompanied by her daughter during the current feedback session. Content of the current session focused  on the results of her neuropsychological evaluation. Samantha Luna was given the opportunity to ask questions and her questions were answered. She was encouraged to reach out should additional questions arise. A copy of her report was provided at the conclusion of the visit.      Greater than 31 minutes were spent preparing for, conducting, and documenting the current feedback session with Samantha Luna, billed as one unit [phone].

## 2022-12-27 NOTE — Progress Notes (Signed)
Prescription for Arnuity 200 sent to pharmacy in place of Flovent 

## 2022-12-28 NOTE — Telephone Encounter (Signed)
Called pt and there was no answer-LMTCB °

## 2022-12-29 NOTE — Telephone Encounter (Signed)
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 She verbalized understanding. NFN

## 2023-01-05 ENCOUNTER — Telehealth: Payer: Self-pay | Admitting: Pulmonary Disease

## 2023-01-05 NOTE — Telephone Encounter (Signed)
Okay to place order for cpap supplies?

## 2023-01-05 NOTE — Telephone Encounter (Signed)
Patient needs order for CPAP supplies. Patient phone number is [phone].

## 2023-01-08 ENCOUNTER — Other Ambulatory Visit: Payer: Self-pay

## 2023-01-08 DIAGNOSIS — G4733 Obstructive sleep apnea (adult) (pediatric): Secondary | ICD-10-CM

## 2023-01-08 NOTE — Telephone Encounter (Signed)
Okay to place orders for CPAP supplies 

## 2023-01-08 NOTE — Telephone Encounter (Signed)
Spoke with patients daughter. Advised cpap supplies order has been sent to [facility]. NFN

## 2023-01-15 ENCOUNTER — Other Ambulatory Visit: Payer: Self-pay | Admitting: Family

## 2023-01-15 ENCOUNTER — Ambulatory Visit
Admission: RE | Admit: 2023-01-15 | Discharge: 2023-01-15 | Disposition: A | Discharge status: Home / Self Care | Payer: 59 | Source: Ambulatory Visit | Attending: Family | Admitting: Family

## 2023-01-15 DIAGNOSIS — M542 Cervicalgia: Secondary | ICD-10-CM

## 2023-01-15 DIAGNOSIS — R0602 Shortness of breath: Secondary | ICD-10-CM

## 2023-01-15 DIAGNOSIS — M545 Low back pain, unspecified: Secondary | ICD-10-CM

## 2023-01-30 ENCOUNTER — Telehealth: Payer: Self-pay

## 2023-01-30 NOTE — Telephone Encounter (Signed)
Pt daughter called since she is quite involved with care, and confirm that they wanted to hold off on spinal tap for now? Yes they want to hold off on the spinal tap. Pt daughter wants to know what you think about follow up if you think it should be normal follow up or seen sooner

## 2023-01-30 NOTE — Telephone Encounter (Signed)
-----   Message from Van Clines, MD sent at 01/30/2023 12:25 PM EDT ----- Regarding: f/u Can you pls call daughter since she is quite involved with care, and confirm that they wanted to hold off on spinal tap for now? If yes, are they okay with routine f/u with me or did she want an earlier visit? Thanks  ----- Message ----- From: Rosann Auerbach, PhD Sent: 12/19/2022   4:02 PM EDT To: Marcos Eke, PA-C; Van Clines, MD  Neuropsychological evaluation completed. The signed note can be found in the patient's chart.

## 2023-01-31 NOTE — Telephone Encounter (Signed)
Ok for [employer] [facility] [employer] in Oct or Nov for f/u with me, thanks

## 2023-02-22 ENCOUNTER — Ambulatory Visit: Payer: Medicare HMO | Admitting: Physician Assistant

## 2023-02-22 ENCOUNTER — Ambulatory Visit: Payer: Medicare HMO | Admitting: Neurology

## 2023-04-26 ENCOUNTER — Other Ambulatory Visit: Payer: Self-pay | Admitting: Family

## 2023-04-26 DIAGNOSIS — M79604 Pain in right leg: Secondary | ICD-10-CM

## 2023-05-04 ENCOUNTER — Ambulatory Visit
Admission: RE | Admit: 2023-05-04 | Discharge: 2023-05-04 | Disposition: A | Discharge status: Home / Self Care | Payer: 59 | Source: Ambulatory Visit | Attending: Family | Admitting: Family

## 2023-05-04 DIAGNOSIS — M79604 Pain in right leg: Secondary | ICD-10-CM

## 2023-05-30 ENCOUNTER — Encounter: Payer: Self-pay | Admitting: Neurology

## 2023-05-30 ENCOUNTER — Ambulatory Visit (INDEPENDENT_AMBULATORY_CARE_PROVIDER_SITE_OTHER): Payer: 59 | Admitting: Neurology

## 2023-05-30 VITALS — BP 136/78 | HR 102 | Ht 62.0 in | Wt 225.4 lb

## 2023-05-30 DIAGNOSIS — G3184 Mild cognitive impairment, so stated: Secondary | ICD-10-CM

## 2023-05-30 NOTE — Progress Notes (Signed)
NEUROLOGY FOLLOW UP OFFICE NOTE  Samantha Luna [id number] [birth year]  HISTORY OF PRESENT ILLNESS: I had the pleasure of seeing Samantha Luna in follow-up in the neurology clinic on 05/30/2023.  The patient was last seen 6 months ago for memory loss. Her daughter Samantha Luna is present on video conference to provide additional information. Records and images were personally reviewed where available.  Since her last visit, she had repeat Neuropsychological testing in April 2024 with a diagnosis of Mild Neurocognitive Disorder. There was note of an isolated impairment surrounding receptive language, this was where greatest decline was compared to evaluation in 2023. Etiology continues to be difficult to pinpoint, there remains potential for a prominent cerebrovascular contribution, cognitive profile was not suggestive of Alzheimer's disease. It was also noted that while she did not report significant symptoms of depression or anxiety, she strongly alluded to numerous ongoing family dynamics which are likely the source of quite significant distress.   She feels she has been doing fine. Samantha Luna notes that for the most part, everything has been stable. She continues to live alone. She drives and denies getting lost driving. Sometimes she forgets her medication then takes it later in the day. She denies missing any bills. Samantha Luna cannot confirm these because she has not been telling her family if something happened. Samantha Luna feels she is slipping on some things like her medications. The patient states she will tell her doctor, stating "they talk too much." She reports mood is good. Samantha Luna notes that mood is getting better, previously there was pain, fear, anxiety with losing her independence. She exercises regularly. No headaches, dizziness, vision changes, focal numbness/tingling/weakness, no falls.   HPI 11/01/2022: Patient was initially seen by Memory Disorders PA Samantha Luna in 07/2021 for memory loss, MoCA was 23/30. I  personally reviewed brain MRI without contrast done 08/2021, no acute changes, there was a hypointense mass on the left and superior to the torcula measuring 13mm with CSF cleft consistent with meningioma, mild chronic microvascular disease in the periventricular white matter and more moderate in the pons. She underwent Neuropsychological testing in 12/2021 which was largely within normal limits, primary weakness in encoding novel verbal information and variability across executive functioning. It was felt there could be a vascular contribution to ongoing dysfunction. She also reported mild to moderate psychiatric distress, mild sleep dysfunction, and ongoing chronic pain which can create and/or worsen cognitive inefficiencies. On follow-up with Samantha Luna in January, daughter expressed concern that vascular issues are causing mood changes. Repeat MRI brain with and without contrast was done 10/16/22 with no significant change from prior scan. They present to discuss results and concerns.  She feels her memory is "okay." Her daughter started noticing memory changes around 3 years ago where she would repeat the same stories within 5 minutes in the same conversation. She moved from [place] to live with her daughter almost 2 years ago, and Samantha Luna noticed that she would get confused with medications, she would continue taking it even when instructed to stop it, or need reminds that she is taking one medication and not another. She would take medication twice a day instead of once a day, or say she does not want to take all of them. She felt like she was overmedicated. She would take vinegar before her medications and admit to forgetting medication some weeks. She admits to forgetting boll payments, Samantha Luna recalls she forgot to pay her car insurance 1-2 months ago and had to fix it, she states she was  about to get a new car and did not want to pay twice. She denies getting lost driving, Samantha Luna reminds her she got  lost 2 days ago going to her daughter;s house, she says she was [place]. No hygiene concerns, she is independent with dressing and bathing. She denies leaving the stove on or misplacing things frequently. Her father, mother, and sister had dementia. No alcohol use. She usually gets 4-6 hours of sleep. She lives alone. She reports mood is great. Samantha Luna thinks mood is a whole lot better, she is happier/more cheerful since knee surgery in 04/2022. No hallucinations or paranoia. She feels like she has racing thoughts a lot of times, getting confused a lot. She states "sometimes pieces are not coming together." She was told by her sleep specialist that "you sleep but you're not sleeping, brain is constantly going." Samantha Luna notes she has been through a lot and never processed it. There may be some unprocessed trauma.    PAST MEDICAL HISTORY: Past Medical History:  Diagnosis Date   Acute exacerbation of chronic obstructive airways disease (HCC) 11/09/2017   Hospitalization 11/09/17 in [place] - See Care Everywhere   Allergic contact dermatitis 09/12/2021   Atherosclerosis of native arteries of extremities with intermittent claudication, right leg (HCC) 08/11/2019   Bell's palsy    Chest pain 05/07/2010   Complication of anesthesia    hysterectomy - slow to wake up   Congestive heart failure (HCC) 04/26/2010   Echo 11/13/20 in [place] showed normal ejection fraction, normal echo.    COPD (chronic obstructive pulmonary disease) (HCC) 02/11/2016   Dyspnea    Essential hypertension 08/25/2021   Generalized anxiety disorder 01/06/2022   GERD (gastroesophageal reflux disease)    High cholesterol 02/11/2016   History of COVID-19 07/20/2020   Laryngopharyngeal reflux 08/14/2018   Major depressive disorder    Meningioma (HCC) 04/23/2010   01/28/13 Head CT: A left posterior parafalcine meningioma is seen that measures 1.5 cm,  previously measuring 1.3 cm. Basal cisterns and  sulci are unremarkable. Impression:  Left posterior parafalcine meningioma is mildly increased in size since  prior without significant surrounding mass effect or acute process visualized.   Acute or subacute ischemia is not excluded with CT.   Mild cognitive impairment of uncertain or unknown etiology 12/19/2022   Mild persistent asthma 05/01/2018   Obstructive sleep apnea 08/18/2021   variable CPAP use   Osteoarthritis of right knee 11/10/2020   Peripheral vascular disease (HCC)    Pneumonia 2020   Pre-diabetes    Restrictive lung disease 03/05/2018    MEDICATIONS: Current Outpatient Medications on File Prior to Visit  Medication Sig Dispense Refill   albuterol (PROVENTIL) (2.5 MG/3ML) 0.083% nebulizer solution Take 3 mLs (2.5 mg total) by nebulization every 6 (six) hours as needed for wheezing or shortness of breath. 300 mL 3   albuterol (VENTOLIN HFA) 108 (90 Base) MCG/ACT inhaler Inhale 1-2 puffs into the lungs every 6 (six) hours as needed for wheezing or shortness of breath. 18 g 3   aspirin EC 81 MG tablet Take 81 mg by mouth daily. Swallow whole.     escitalopram (LEXAPRO) 10 MG tablet Take 10 mg by mouth daily.     Fluticasone Furoate (ARNUITY ELLIPTA) 200 MCG/ACT AEPB Inhale 1 puff into the lungs daily. 30 each 3   ibandronate (BONIVA) 150 MG tablet Take 150 mg by mouth every 30 (thirty) days.     loratadine (CLARITIN) 10 MG tablet Take 10  mg by mouth daily as needed for allergies.     losartan-hydrochlorothiazide (HYZAAR) 100-25 MG tablet Take 1 tablet by mouth daily.     Melatonin-Pyridoxine (MELATIN PO) Take by mouth.     Multiple Vitamins-Minerals (MULTIVITAMIN WITH MINERALS) tablet Take 1 tablet by mouth daily.     MYRBETRIQ 50 MG TB24 tablet Take 50 mg by mouth every other day.     nitroGLYCERIN (NITROSTAT) 0.4 MG SL tablet Place 0.4 mg under the tongue every 5 (five) minutes as needed for chest pain.     omeprazole (PRILOSEC) 20 MG capsule Take 20 mg by mouth daily.      rosuvastatin (CRESTOR) 5 MG tablet Take 5 mg by mouth daily.     Spacer/Aero-Holding Chambers (AEROCHAMBER HOLDING CHAMBER) DEVI 1 Device by Does not apply route once for 1 dose. 1 each 0   tiZANidine (ZANAFLEX) 4 MG tablet take 1 tablet by mouth three times daily as needed for spasm     traMADol (ULTRAM) 50 MG tablet Take 1-2 tablets (50-100 mg total) by mouth every 6 (six) hours as needed for moderate pain. (Patient not taking: Reported on 05/30/2023) 40 tablet 0   No current facility-administered medications on file prior to visit.    ALLERGIES: Allergies  Allergen Reactions   Iodine Other (See Comments)    Uncontrollable sneezing    Codeine Itching    FAMILY HISTORY: Family History  Problem Relation Age of Onset   Memory loss Mother    Dementia Father    Asthma Sister    Immunodeficiency Daughter    Asthma Daughter    Anxiety disorder Daughter    Depression Daughter    Depression Daughter    Asthma Daughter    Bipolar disorder Daughter    Immunodeficiency Grandson    Asthma Grandson     SOCIAL HISTORY: Social History   Socioeconomic History   Marital status: Single    Spouse name: Not on file   Number of children: Not on file   Years of education: 12   Highest education level: High school graduate  Occupational History   Occupation: Retired    Comment: Midwife  Tobacco Use   Smoking status: Former    Current packs/day: 0.00    Average packs/day: 1 pack/day for 40.0 years (40.0 ttl pk-yrs)    Types: Cigarettes    Start date: 08/28/1981    Quit date: 08/28/2021    Years since quitting: 1.7   Smokeless tobacco: Never  Vaping Use   Vaping status: Never Used  Substance and Sexual Activity   Alcohol use: Not Currently    Comment: Very infrequent   Drug use: Not Currently    Types: Marijuana    Comment: Pt reports that a joint will last her several months   Sexual activity: Not Currently  Other Topics Concern   Not on file  Social History Narrative   Right  handed    Social Determinants of Health   Financial Resource Strain: Not at Risk (07/14/2022)   Received from [place] [employer]    Financial Resource Strain: 1  Food Insecurity: Not at Risk (07/14/2022)   Received from [place], [place]   Food Insecurity    Food: 1  Transportation Needs: Not at Risk (07/14/2022)   Received from [place] [employer] Needs    Transportation: 1  Physical Activity: Not on File (12/15/2021)   Received from [place], [place]   Physical Activity    Physical  Activity: 0  Stress: Not on File (12/15/2021)   Received from [facility], [place]   Stress    Stress: 0  Social Connections: Not on File (05/04/2023)   Received from [facility]   Social Connections    Connectedness: 0  Intimate Partner Violence: Not At Risk (05/22/2022)   Humiliation, Afraid, Rape, and Kick questionnaire    Fear of Current or Ex-Partner: No    Emotionally Abused: No    Physically Abused: No    Sexually Abused: No     PHYSICAL EXAM: Vitals:   05/30/23 1329  BP: 136/78  Pulse: (!) 102  SpO2: 96%   General: No acute distress Head:  Normocephalic/atraumatic Skin/Extremities: No rash, no edema Neurological Exam: alert and oriented to person, place, and time. No aphasia or dysarthria. Fund of knowledge is appropriate.  Attention and concentration are reduced. MoCA 21/30. Cranial nerves: Pupils equal, round. Extraocular movements intact .  No facial asymmetry.  Motor: Moves all extremities symmetrically at least anti-gravity x 4. Gait narrow-based and steady, no ataxia. No tremors.      05/30/2023    1:00 PM 08/10/2021    9:00 AM  Montreal Cognitive Assessment   Visuospatial/ Executive (0/5) 2 4  Naming (0/3) 3 2  Attention: Read list of digits (0/2) 2 2  Attention: Read list of letters (0/1) 1 0  Attention: Serial 7 subtraction starting at 100 (0/3) 0 2  Language: Repeat phrase (0/2) 0 1  Language : Fluency (0/1) 1 0  Abstraction (0/2) 2 1  Delayed Recall (0/5)  3 4  Orientation (0/6) 6 6  Total 20 22  Adjusted Score (based on education) 21 23     IMPRESSION: This is a 62 yo RH woman with a history of hypertension, hyperlipidemia, with Mild Neurocognitive Disorder on repeat Neuropsychological evaluation in April 2024, etiology continues to be difficult to pinpoint, there remains potential for prominent cerebrovascular contribution, as well as possibly family dynamics contributing to distress. We discussed results today. We had previously discussed doing a lumbar puncture for CSF AD markers, however since repeat Neuropsych results do not indicate AD profile, and patient refuses testing, we have agreed to hold off. MoCA today 21/30. She is overall stable, mood is also better. Continue control of vascular risk factors, physical and brain stimulation exercises for overall brain health. Continue close supervision, monitor driving. All their questions were answered to the best of my abilities. Follow-up in 6 months, call for any changes.   . Thank you for allowing me to participate in her care.  Please do not hesitate to call for any questions or concerns.  The duration of this appointment visit was 30 minutes of face-to-face time with the patient.  Greater than 50% of this time was spent in counseling, explanation of diagnosis, planning of further management, and coordination of care.   Patrcia Dolly, M.D.   CC: Dr. Roxan Hockey

## 2023-05-30 NOTE — Patient Instructions (Signed)
Good to see you. Continue regular exercise. Continue control of blood pressure, cholesterol, and sugar levels. Follow-up in 6 months, call for any changes   FALL PRECAUTIONS: Be cautious when walking. Scan the area for obstacles that may increase the risk of trips and falls. When getting up in the mornings, sit up at the edge of the bed for a few minutes before getting out of bed. Consider elevating the bed at the head end to avoid drop of blood pressure when getting up. Walk always in a well-lit room (use night lights in the walls). Avoid area rugs or power cords from appliances in the middle of the walkways. Use a walker or a cane if necessary and consider physical therapy for balance exercise. Get your eyesight checked regularly.  FINANCIAL OVERSIGHT: Supervision, especially oversight when making financial decisions or transactions is also recommended as difficulties arise.  HOME SAFETY: Consider the safety of the kitchen when operating appliances like stoves, microwave oven, and blender. Consider having supervision and share cooking responsibilities until no longer able to participate in those. Accidents with firearms and other hazards in the house should be identified and addressed as well.  DRIVING: Regarding driving, in patients with progressive memory problems, driving will be impaired. We advise to have someone else do the driving if trouble finding directions or if minor accidents are reported. Independent driving assessment is available to determine safety of driving.  ABILITY TO BE LEFT ALONE: If patient is unable to contact 911 operator, consider using LifeLine, or when the need is there, arrange for someone to stay with patients. Smoking is a fire hazard, consider supervision or cessation. Risk of wandering should be assessed by caregiver and if detected at any point, supervision and safe proof recommendations should be instituted.  MEDICATION SUPERVISION: Inability to self-administer  medication needs to be constantly addressed. Implement a mechanism to ensure safe administration of the medications.  RECOMMENDATIONS FOR ALL PATIENTS WITH MEMORY PROBLEMS: 1. Continue to exercise (Recommend 30 minutes of walking everyday, or 3 hours every week) 2. Increase social interactions - continue going to [place] and enjoy social gatherings with friends and family 3. Eat healthy, avoid fried foods and eat more fruits and vegetables 4. Maintain adequate blood pressure, blood sugar, and blood cholesterol level. Reducing the risk of stroke and cardiovascular disease also helps promoting better memory. 5. Avoid stressful situations. Live a simple life and avoid aggravations. Organize your time and prepare for the next day in anticipation. 6. Sleep well, avoid any interruptions of sleep and avoid any distractions in the bedroom that may interfere with adequate sleep quality 7. Avoid sugar, avoid sweets as there is a strong link between excessive sugar intake, diabetes, and cognitive impairment The Mediterranean diet has been shown to help patients reduce the risk of progressive memory disorders and reduces cardiovascular risk. This includes eating fish, eat fruits and green leafy vegetables, nuts like almonds and hazelnuts, walnuts, and also use olive oil. Avoid fast foods and fried foods as much as possible. Avoid sweets and sugar as sugar use has been linked to worsening of memory function.  There is always a concern of gradual progression of memory problems. If this is the case, then we may need to adjust level of care according to patient needs. Support, both to the patient and caregiver, should then be put into place.

## 2023-06-29 ENCOUNTER — Telehealth: Payer: Self-pay | Admitting: Pulmonary Disease

## 2023-06-29 NOTE — Telephone Encounter (Signed)
albuterol (VENTOLIN HFA) 108 (90 Base) MCG/ACT inhaler  Fluticasone Furoate (ARNUITY ELLIPTA) 200 MCG/ACT AEPB    Req they be sent to [facility]

## 2023-06-30 MED ORDER — ALBUTEROL SULFATE HFA 108 (90 BASE) MCG/ACT IN AERS: 1.0000 | INHALATION_SPRAY | Freq: Four times a day (QID) | RESPIRATORY_TRACT | 3 refills | Status: DC | PRN

## 2023-06-30 MED ORDER — ARNUITY ELLIPTA 200 MCG/ACT IN AEPB: 1.0000 | INHALATION_SPRAY | Freq: Every day | RESPIRATORY_TRACT | 3 refills | Status: DC

## 2023-06-30 NOTE — Telephone Encounter (Signed)
[facility] and mychart sent to pt making her aware.

## 2023-07-11 ENCOUNTER — Other Ambulatory Visit: Payer: Self-pay | Admitting: Family

## 2023-07-11 DIAGNOSIS — Z122 Encounter for screening for malignant neoplasm of respiratory organs: Secondary | ICD-10-CM

## 2023-07-18 ENCOUNTER — Ambulatory Visit
Admission: RE | Admit: 2023-07-18 | Discharge: 2023-07-18 | Disposition: A | Discharge status: Home / Self Care | Payer: 59 | Source: Ambulatory Visit | Attending: Family | Admitting: Family

## 2023-07-18 ENCOUNTER — Telehealth: Payer: Self-pay | Admitting: Pulmonary Disease

## 2023-07-18 DIAGNOSIS — Z122 Encounter for screening for malignant neoplasm of respiratory organs: Secondary | ICD-10-CM

## 2023-07-18 NOTE — Telephone Encounter (Signed)
I tried calling the pt and there was no answer and her VM was full  Will call back

## 2023-07-18 NOTE — Telephone Encounter (Signed)
Patient has been taking arnuity 200mg  but its causing her mouth to be sore. She would like something prescribed to help with the thrush and soreness.   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

## 2023-07-19 NOTE — Telephone Encounter (Signed)
Pt ret call. Please call again [phone]

## 2023-07-24 NOTE — Telephone Encounter (Addendum)
Spoke with patient and she states since starting Arnuity she has been having increasing issues with thrush. She is at a point that her mouth is very sore. She would like to change to an alternate inhaler. Per chart notes her insurance prefers Arnuity and Qvar before they will cover Flovent.   Per front office there are no sooner openings with providers. She can call office to check to see if we have any openings and have requested to have her added the the cancellation list.    Please advise on treatment for thrush and inhaler change.Thanks!

## 2023-07-31 ENCOUNTER — Telehealth: Payer: Self-pay

## 2023-07-31 NOTE — Telephone Encounter (Signed)
*  Pulm  Pharmacy Patient Advocate Encounter   Received notification from CoverMyMeds that prior authorization for Fluticasone Propionate HFA 220MCG/ACT aerosol  is required/requested.   Insurance verification completed.   The patient is insured through [employer] .   Per test claim: PA required; PA submitted to above mentioned insurance via CoverMyMeds Key/confirmation #/EOC [id number] Status is pending

## 2023-08-02 NOTE — Telephone Encounter (Signed)
Pharmacy Patient Advocate Encounter  Received notification from [facility] that Prior Authorization for Fluticasone Propionate HFA 220MCG/ACT aerosol  has been DENIED.  Full denial letter will be uploaded to the media tab. See denial reason below.   PA #/Case ID/Reference #: Fluticasone propionate HFA is denied because it is not on your plan's Drug List (formulary). Medication authorization requires the following: (1) You need to try this covered drug: Qvar RediHaler; OR (2) your doctor needs to give [place] specific medical reasons why the covered drug is not appropriate for you

## 2023-08-03 ENCOUNTER — Telehealth: Payer: Self-pay | Admitting: Pulmonary Disease

## 2023-08-03 ENCOUNTER — Other Ambulatory Visit: Payer: Self-pay | Admitting: Pulmonary Disease

## 2023-08-03 MED ORDER — CLOTRIMAZOLE 10 MG MT TROC: 10.0000 mg | Freq: Every day | OROMUCOSAL | 0 refills | Status: DC

## 2023-08-03 MED ORDER — QVAR REDIHALER 80 MCG/ACT IN AERB: INHALATION_SPRAY | RESPIRATORY_TRACT | 2 refills | Status: DC

## 2023-08-03 NOTE — Telephone Encounter (Addendum)
Can patient have a medication for the mouth irritation caused by Arnuity?  Thanks!  Per pharmacy request, need sig on Qvar Redihaler 80 mcg please?

## 2023-08-03 NOTE — Telephone Encounter (Signed)
Per Kandice Robinsons, NP:  Bevelyn Ngo, NP  to Me  [facility] Triage Pool     08/03/23  1:49 PM  OK to send in Mycelex Troches  for thrush. 1 troche 5 times a day for 14 days 70 tabs total  OK to send in Qvar Redi inhaler 80 mcg 1-2 puffs twice daily Rinse mouth after use  Thanks  Sent in rx.  Notified patient.

## 2023-08-03 NOTE — Telephone Encounter (Signed)
Patient was on a previous medication [employer] that made her mouth sore. She needs a new prescription for an inhaler and a pill that is supposed to fix the thrush in her mouth from the [employer]. She would like a call when this is completed. Sending back as high priority due to this being over a week. Call back number [phone]  [facility]: [facility]

## 2023-08-03 NOTE — Telephone Encounter (Signed)
Done.  See telephone contact for 08/03/2023.

## 2023-08-03 NOTE — Telephone Encounter (Addendum)
Called patient.  States she has thrush in her mouth and mouth is very sore and irritated.  Patient said she has been using Arnuity Ellipta and this irritated her mouth then.  Patient states she needs an inhaler that she can use with her spacer that is not a powder base.  Patient is getting more SOB due to not being able to use a maintenance inhaler.  She has been waiting on a call for several days now.  Patient also needs medication to help with oral thrush.  It looks like a PA has been done by Pharmacy Team on 08/02/2023 and Qvar RediHaler is on plans formulary (not Flovent).  Can we call in this medication?  Patient is going out of town today and needs this called in today.  Please advise.

## 2023-08-08 ENCOUNTER — Other Ambulatory Visit: Payer: Self-pay | Admitting: Pulmonary Disease

## 2023-08-08 NOTE — Telephone Encounter (Signed)
On Qvar 08/03/2023

## 2023-08-15 ENCOUNTER — Encounter: Payer: Self-pay | Admitting: Pulmonary Disease

## 2023-08-24 NOTE — Telephone Encounter (Signed)
These are the rules that we have to follow so that we do not expose people to too much radiation.  4 mm nodules usually have to be followed over time to make sure they do not change.  Mentioning it is benign in appearance just reflects what it looks like during evaluation. Unfortunately 4 mm nodules are too small to be biopsied or followed up in any other way apart from repeating a CAT scan to see if it changes.

## 2023-08-27 ENCOUNTER — Encounter: Payer: Self-pay | Admitting: Pulmonary Disease

## 2023-08-27 ENCOUNTER — Ambulatory Visit: Payer: 59 | Admitting: Pulmonary Disease

## 2023-08-27 VITALS — BP 118/68 | HR 100 | Temp 97.8°F | Ht 62.0 in | Wt 227.8 lb

## 2023-08-27 DIAGNOSIS — G4733 Obstructive sleep apnea (adult) (pediatric): Secondary | ICD-10-CM

## 2023-08-27 NOTE — Progress Notes (Signed)
Samantha Luna    [id number]    Aug 15, 1961  Primary Care Physician:Robinson, Marlana Latus, MD  Referring Physician: Verlon Au, MD [phone] [address]. [address] [place],  [place] [address]  Chief complaint:   Patient with a history of obstructive sleep apnea Has been trying to use CPAP nightly  HPI:  Tolerating CPAP well Does not remember to put it on every night but usually when she puts it on and wakes up in the morning she feels like she is at a good nights rest  History of asthma on Qvar  Tolerating Qvar better than previous inhaler  Has been doing relatively well  She is exercising 3 days a week  Benefit from CPAP when she is able to use it on a regular basis -She is trying to use it regularly  Most recent home sleep study on 12/23/2021 with an AHI of 6  Post knee surgery which she tolerated well-left knee  She currently she recollects that the study showed that she stop breathing about 11 times an hour -Recent study shows AHI of 6  [place] Used CPAP regularly and then got away from using it Usually goes to bed about 7 PM, final wake up time about 4 AM, she does have about 1 or 2 awakenings Denies any headaches in the morning She does have dryness of her mouth in the mornings No family history of sleep apnea known to her She does have a history of hypertension, has had a negative stress test recently, does have a history of angina  PFT from 2020 did reveal mild obstructive disease, mild restriction  Recent CT scan of the chest shows a 4 mm nodule  Outpatient Encounter Medications as of 08/27/2023  Medication Sig   albuterol (PROVENTIL) (2.5 MG/3ML) 0.083% nebulizer solution Take 3 mLs (2.5 mg total) by nebulization every 6 (six) hours as needed for wheezing or shortness of breath.   albuterol (VENTOLIN HFA) 108 (90 Base) MCG/ACT inhaler INHALE 1 TO 2 PUFFS BY MOUTH EVERY 6 HOURS AS NEEDED FOR WHEEZING FOR SHORTNESS OF BREATH   aspirin EC 81  MG tablet Take 81 mg by mouth daily. Swallow whole.   beclomethasone (QVAR REDIHALER) 80 MCG/ACT inhaler 1-2 puffs twice a day. Rinse mouth with water, gargle and spit after use.   clotrimazole (MYCELEX) 10 MG troche Take 1 tablet (10 mg total) by mouth 5 (five) times daily.   escitalopram (LEXAPRO) 10 MG tablet Take 10 mg by mouth daily.   ibandronate (BONIVA) 150 MG tablet Take 150 mg by mouth every 30 (thirty) days.   loratadine (CLARITIN) 10 MG tablet Take 10 mg by mouth daily as needed for allergies.   losartan-hydrochlorothiazide (HYZAAR) 100-25 MG tablet Take 1 tablet by mouth daily.   Melatonin-Pyridoxine (MELATIN PO) Take by mouth.   Multiple Vitamins-Minerals (MULTIVITAMIN WITH MINERALS) tablet Take 1 tablet by mouth daily.   MYRBETRIQ 50 MG TB24 tablet Take 50 mg by mouth every other day.   nitroGLYCERIN (NITROSTAT) 0.4 MG SL tablet Place 0.4 mg under the tongue every 5 (five) minutes as needed for chest pain.   omeprazole (PRILOSEC) 20 MG capsule Take 20 mg by mouth daily.   rosuvastatin (CRESTOR) 5 MG tablet Take 5 mg by mouth daily.   Spacer/Aero-Holding Chambers (AEROCHAMBER HOLDING CHAMBER) DEVI 1 Device by Does not apply route once for 1 dose.   tiZANidine (ZANAFLEX) 4 MG tablet take 1 tablet by mouth three  times daily as needed for spasm   traMADol (ULTRAM) 50 MG tablet Take 1-2 tablets (50-100 mg total) by mouth every 6 (six) hours as needed for moderate pain. (Patient not taking: Reported on 05/30/2023)   No facility-administered encounter medications on file as of 08/27/2023.    Allergies as of 08/27/2023 - Review Complete 08/27/2023  Allergen Reaction Noted   Iodine Other (See Comments) 05/16/2021   Codeine Itching 05/16/2021    Past Medical History:  Diagnosis Date   Acute exacerbation of chronic obstructive airways disease (HCC) 11/09/2017   Hospitalization 11/09/17 in [place] - See Care Everywhere   Allergic contact dermatitis 09/12/2021   Atherosclerosis of  native arteries of extremities with intermittent claudication, right leg (HCC) 08/11/2019   Bell's palsy    Chest pain 05/07/2010   Complication of anesthesia    hysterectomy - slow to wake up   Congestive heart failure (HCC) 04/26/2010   Echo 11/13/20 in [place] showed normal ejection fraction, normal echo.    COPD (chronic obstructive pulmonary disease) (HCC) 02/11/2016   Dyspnea    Essential hypertension 08/25/2021   Generalized anxiety disorder 01/06/2022   GERD (gastroesophageal reflux disease)    High cholesterol 02/11/2016   History of COVID-19 07/20/2020   Laryngopharyngeal reflux 08/14/2018   Major depressive disorder    Meningioma (HCC) 04/23/2010   01/28/13 Head CT: A left posterior parafalcine meningioma is seen that measures 1.5 cm,  previously measuring 1.3 cm. Basal cisterns and sulci are unremarkable. Impression:  Left posterior parafalcine meningioma is mildly increased in size since  prior without significant surrounding mass effect or acute process visualized.   Acute or subacute ischemia is not excluded with CT.   Mild cognitive impairment of uncertain or unknown etiology 12/19/2022   Mild persistent asthma 05/01/2018   Obstructive sleep apnea 08/18/2021   variable CPAP use   Osteoarthritis of right knee 11/10/2020   Peripheral vascular disease (HCC)    Pneumonia 2020   Pre-diabetes    Restrictive lung disease 03/05/2018    Past Surgical History:  Procedure Laterality Date   ABDOMINAL HYSTERECTOMY  2017   meniscus repair on right knee  Right 2017   TONSILLECTOMY     as a child   TOTAL KNEE ARTHROPLASTY Left 10/03/2021   Procedure: TOTAL KNEE ARTHROPLASTY;  Surgeon: Ollen Gross, MD;  Location: [facility];  Service: Orthopedics;  Laterality: Left;   TOTAL KNEE ARTHROPLASTY Right 05/22/2022   Procedure: TOTAL KNEE ARTHROPLASTY;  Surgeon: Ollen Gross, MD;  Location: [facility];  Service: Orthopedics;  Laterality: Right;    Family History  Problem Relation Age of  Onset   Memory loss Mother    Dementia Father    Asthma Sister    Immunodeficiency Daughter    Asthma Daughter    Anxiety disorder Daughter    Depression Daughter    Depression Daughter    Asthma Daughter    Bipolar disorder Daughter    Immunodeficiency Grandson    Asthma Grandson     Social History   Socioeconomic History   Marital status: Single    Spouse name: Not on file   Number of children: Not on file   Years of education: 12   Highest education level: High school graduate  Occupational History   Occupation: Retired    Comment: Midwife  Tobacco Use   Smoking status: Former    Current packs/day: 0.00    Average packs/day: 1 pack/day for 40.0 years (40.0 ttl pk-yrs)    Types:  Cigarettes    Start date: 08/28/1981    Quit date: 08/28/2021    Years since quitting: 1.9   Smokeless tobacco: Never  Vaping Use   Vaping status: Never Used  Substance and Sexual Activity   Alcohol use: Not Currently    Comment: Very infrequent   Drug use: Not Currently    Types: Marijuana    Comment: Pt reports that a joint will last her several months   Sexual activity: Not Currently  Other Topics Concern   Not on file  Social History Narrative   Right handed    Social Drivers of Health   Financial Resource Strain: Not at Risk (07/11/2023)   Received from [employer] [facility]    Financial Resource Strain: 1  Food Insecurity: Not at Risk (07/14/2022)   Received from [place], [place]   Food Insecurity    Food: 1  Transportation Needs: Not at Risk (07/11/2023)   Received from [facility] Needs    Transportation: 1  Physical Activity: Not at Risk (07/11/2023)   Received from [facility]   Physical Activity    Physical Activity: 1  Stress: Not at Risk (07/11/2023)   Received from [facility]   Stress    Stress: 1  Social Connections: Not at Risk (07/11/2023)   Received from [facility]   Social Connections    Connectedness: 1  Intimate Partner Violence: Not At Risk  (05/22/2022)   Humiliation, Afraid, Rape, and Kick questionnaire    Fear of Current or Ex-Partner: No    Emotionally Abused: No    Physically Abused: No    Sexually Abused: No    Review of Systems  Constitutional:  Negative for fatigue and fever.  Respiratory:  Positive for shortness of breath.   Psychiatric/Behavioral:  Positive for sleep disturbance.     There were no vitals filed for this visit.     Physical Exam Constitutional:      Appearance: She is obese.  HENT:     Head: Normocephalic.     Right Ear: Tympanic membrane normal.     Nose: No congestion.     Mouth/Throat:     Mouth: Mucous membranes are moist.     Comments: Mallampati 4, crowded oropharynx, macroglossia Eyes:     Pupils: Pupils are equal, round, and reactive to light.  Cardiovascular:     Rate and Rhythm: Normal rate and regular rhythm.     Heart sounds: No murmur heard.    No friction rub.  Pulmonary:     Effort: No respiratory distress.     Breath sounds: No stridor. No wheezing or rhonchi.  Musculoskeletal:     Cervical back: No rigidity or tenderness.  Neurological:     Mental Status: She is alert.  Psychiatric:        Mood and Affect: Mood normal.       09/09/2021    9:00 AM  Results of the Epworth flowsheet  Sitting and reading 3  Watching TV 3  Sitting, inactive in a public place (e.g. a theatre or a meeting) 2  As a passenger in a car for an hour without a break 2  Lying down to rest in the afternoon when circumstances permit 3  Sitting and talking to someone 2  Sitting quietly after a lunch without alcohol 2  In a car, while stopped for a few minutes in traffic 0  Total score 17   Data Reviewed:  CT scan reviewed showing 4 mm  nodule  A previous PFT from 2020 was brought in by her daughter showing mild obstructive disease, mild restrictive disease-reviewed  Assessment:   History of obstructive sleep apnea Continue CPAP use  History of asthma -Continue Qvar -Use Qvar  twice a day  Albuterol use as needed   Plan/Recommendations:  Continue CPAP  Follow-up in 6 months  Repeat CT scan in a year from now  Call us with significant concerns    Virl Diamond MD [facility] 08/27/2023, 2:16 PM  CC: Verlon Au, MD

## 2023-08-27 NOTE — Patient Instructions (Signed)
I will see you back in 6 months  The spot in the lung is small, unlikely to be anything of concern -You need to continue doing yearly CT scans to make sure it does not change  Continue using CPAP  Continue Qvar twice a day  Albuterol as needed

## 2023-08-30 ENCOUNTER — Other Ambulatory Visit: Payer: Self-pay | Admitting: Pulmonary Disease

## 2023-09-13 ENCOUNTER — Ambulatory Visit: Payer: Self-pay

## 2023-09-13 ENCOUNTER — Institutional Professional Consult (permissible substitution): Payer: 59 | Admitting: Psychology

## 2023-09-20 ENCOUNTER — Encounter: Payer: 59 | Admitting: Psychology

## 2023-09-26 ENCOUNTER — Ambulatory Visit: Payer: 59 | Admitting: Pulmonary Disease

## 2023-10-03 NOTE — Progress Notes (Signed)
 Cardiology Office Note:  .   Date:  10/05/2023  ID:  Samantha Luna, DOB 08/20/1961, MRN [id number] PCP: Lang Dedra DASEN, MD  [facility] Providers Cardiologist:  Dub Huntsman, DO }   History of Present Illness: .   Samantha Luna is a 63 y.o. female  with a hx of hypertension, coronary artery disease, hyperlipidemia, obesity, meningioma, per care everywhere in [place]  2011 the patient was noted to have congestive heart failure. Coronary CT scan 08/18/2021 which did not show any evidence of coronary artery disease or any lung pathology    Samantha Luna comes today with concerns of back spasms.  She states that when she lays flat in her bed or on her side when she gets up out of the bed she has a spasm around her bra line around her waist the last about 30 seconds and goes away on its own.  She has had her knees replaced and has been working with physical therapy and is gotten quite strong, she also does water  aerobics 3 times a week and does not have any issues with back spasms.  It only occurs while she is getting up out of the bed from a lying position.  She denies fluid retention, weight gain, dizziness, or palpitations.  This  ROS: As above otherwise negative  Studies Reviewed: SABRA   Coronary CTA 08/18/2021  MPRESSION: 1. Coronary calcium  score of 0.   2. Normal coronary origin with right dominance.   3. No evidence of CAD. There are significant step artifacts due to cardiac motion, but no CAD is seen  Thoracic Spine Imaging 01/15/2023 FINDINGS: Alignment is anatomic. Multilevel endplate degenerative changes. Cervicothoracic junction is in alignment. Visualized lungs are grossly clear.   IMPRESSION: Multilevel degenerative disc disease.   EKG Interpretation Date/Time:  Friday October 05 2023 09:15:05 EST Ventricular Rate:  91 PR Interval:  148 QRS Duration:  76 QT Interval:  356 QTC Calculation: 437 R Axis:   3  Text Interpretation: Normal sinus rhythm Minimal voltage  criteria for LVH, may be normal variant ( R in aVL ) Anterior infarct (cited on or before 17-May-2022) When compared with ECG of 17-May-2022 12:28, Nonspecific T wave abnormality no longer evident in Inferior leads Nonspecific T wave abnormality no longer evident in Anterior leads Confirmed by Jerilynn Collar [phone]) on 10/05/2023 9:31:13 AM    Physical Exam:   VS:  BP 122/70 (BP Location: Left Arm, Patient Position: Sitting, Cuff Size: Large)   Pulse 91   Ht 5' 2 (1.575 m)   Wt 229 lb (103.9 kg)   BMI 41.88 kg/m    Wt Readings from Last 3 Encounters:  10/05/23 229 lb (103.9 kg)  08/27/23 227 lb 12.8 oz (103.3 kg)  05/30/23 225 lb 6.4 oz (102.2 kg)    GEN: Well nourished, well developed in no acute distress NECK: No JVD; No carotid bruits CARDIAC: RRR, no murmurs, rubs, gallops RESPIRATORY:  Clear to auscultation without rales, wheezing or rhonchi  ABDOMEN: Soft, non-tender, non-distended EXTREMITIES:  No edema; No deformity   ASSESSMENT AND PLAN: .    Musculoskeletal spasm: The patient does have degenerative disc disease and has been working out a lot in the swimming pool and undergoing physical therapy.  The spasm lasts about 30 seconds after rising from a lying position.  I do not believe this is cardiac in etiology specifically because CT scan was normal.  She is to follow-up with her PCP if this continues or becomes worse and longer  lasting.  She verbalizes understanding.  She is already on Zanaflex as needed for muscle aches and pains.  2.  Hypertension: Excellent control of blood pressure today she will continue olmesartan HCTZ 10/25 mg daily, low-sodium diet.  3.  Hypercholesterolemia: Patient is on rosuvastatin  5 mg daily.  Goal of LDL less than 100.  4.  Restrictive lung disease: Followed by pulmonology.  Using albuterol  inhaler as needed.         Signed, Lamarr HERO. Jerilynn CHOL, ANP, AACC

## 2023-10-05 ENCOUNTER — Ambulatory Visit: Payer: 59 | Attending: Adult Health | Admitting: Adult Health

## 2023-10-05 ENCOUNTER — Encounter: Payer: Self-pay | Admitting: Adult Health

## 2023-10-05 VITALS — BP 122/70 | HR 91 | Ht 62.0 in | Wt 229.0 lb

## 2023-10-05 DIAGNOSIS — I779 Disorder of arteries and arterioles, unspecified: Secondary | ICD-10-CM | POA: Diagnosis not present

## 2023-10-05 DIAGNOSIS — R072 Precordial pain: Secondary | ICD-10-CM | POA: Diagnosis not present

## 2023-10-05 DIAGNOSIS — I10 Essential (primary) hypertension: Secondary | ICD-10-CM | POA: Diagnosis not present

## 2023-10-05 DIAGNOSIS — I509 Heart failure, unspecified: Secondary | ICD-10-CM | POA: Diagnosis not present

## 2023-10-05 NOTE — Patient Instructions (Signed)
 Medication Instructions:  NO CHANGES *If you need a refill on your cardiac medications before your next appointment, please call your pharmacy*   Lab Work: NO LABS If you have labs (blood work) drawn today and your tests are completely normal, you will receive your results only by: MyChart Message (if you have MyChart) OR A paper copy in the mail If you have any lab test that is abnormal or we need to change your treatment, we will call you to review the results.   Testing/Procedures: NO TESTING   Follow-Up: At [facility], you and your health needs are our priority.  As part of our continuing mission to provide you with exceptional heart care, we have created designated Provider Care Teams.  These Care Teams include your primary Cardiologist (physician) and Advanced Practice Providers (APPs -  Physician Assistants and Nurse Practitioners) who all work together to provide you with the care you need, when you need it.  Your next appointment:   KEEP FOLLOW UP December 14 2023  Provider:   Kardie Tobb, DO   Other Instructions

## 2023-10-13 ENCOUNTER — Other Ambulatory Visit: Payer: Self-pay | Admitting: Pulmonary Disease

## 2023-10-18 ENCOUNTER — Other Ambulatory Visit: Payer: Self-pay | Admitting: Pulmonary Disease

## 2023-10-18 ENCOUNTER — Telehealth: Payer: Self-pay | Admitting: Pulmonary Disease

## 2023-10-18 DIAGNOSIS — G4733 Obstructive sleep apnea (adult) (pediatric): Secondary | ICD-10-CM

## 2023-10-18 NOTE — Telephone Encounter (Signed)
Pharm: [facility] [address]in [place]  Bacteria Filter needed to be called in for the PT's CPAP. It connects to the hose.   Her # is [phone]

## 2023-10-19 NOTE — Telephone Encounter (Signed)
I called and spoke with the pt  She is asking for filter for CPAP hose  She uses [facility] for her DME  I placed order for this  Nothing further needed

## 2023-10-26 ENCOUNTER — Telehealth: Payer: Self-pay

## 2023-10-26 NOTE — Telephone Encounter (Signed)
 Unable to lm for the patient to call the office to try and schedule an appointment.

## 2023-11-11 ENCOUNTER — Other Ambulatory Visit: Payer: Self-pay | Admitting: Pulmonary Disease

## 2023-11-20 ENCOUNTER — Other Ambulatory Visit: Payer: Self-pay | Admitting: Family

## 2023-11-20 DIAGNOSIS — Z1231 Encounter for screening mammogram for malignant neoplasm of breast: Secondary | ICD-10-CM

## 2023-12-04 ENCOUNTER — Ambulatory Visit
Admission: RE | Admit: 2023-12-04 | Discharge: 2023-12-04 | Disposition: A | Discharge status: Home / Self Care | Source: Ambulatory Visit | Attending: Family | Admitting: Family

## 2023-12-04 DIAGNOSIS — Z1231 Encounter for screening mammogram for malignant neoplasm of breast: Secondary | ICD-10-CM

## 2023-12-12 NOTE — Telephone Encounter (Signed)
error 

## 2023-12-14 ENCOUNTER — Encounter: Payer: Self-pay | Admitting: Cardiology

## 2023-12-14 ENCOUNTER — Ambulatory Visit: Payer: 59 | Attending: Cardiology | Admitting: Cardiology

## 2023-12-14 VITALS — BP 120/76 | HR 83 | Ht 62.0 in | Wt 227.8 lb

## 2023-12-14 DIAGNOSIS — I779 Disorder of arteries and arterioles, unspecified: Secondary | ICD-10-CM | POA: Diagnosis not present

## 2023-12-14 DIAGNOSIS — E782 Mixed hyperlipidemia: Secondary | ICD-10-CM

## 2023-12-14 DIAGNOSIS — G4733 Obstructive sleep apnea (adult) (pediatric): Secondary | ICD-10-CM

## 2023-12-14 NOTE — Patient Instructions (Signed)
 Medication Instructions:  Your physician has recommended you make the following change in your medication: STOP: Nitroglycerin    Lab Work: Lipids If you have labs (blood work) drawn today and your tests are completely normal, you will receive your results only by: MyChart Message (if you have MyChart) OR A paper copy in the mail If you have any lab test that is abnormal or we need to change your treatment, we will call you to review the results.  Follow-Up: At [facility], you and your health needs are our priority.  As part of our continuing mission to provide you with exceptional heart care, our providers are all part of one team.  This team includes your primary Cardiologist (physician) and Advanced Practice Providers or APPs (Physician Assistants and Nurse Practitioners) who all work together to provide you with the care you need, when you need it.  Your next appointment:   1 year(s)  Provider:   Kardie Tobb, DO    Other Instructions:   1st Floor: - Lobby - Registration  - Pharmacy  - Lab - Cafe  2nd Floor: - PV Lab - Diagnostic Testing (echo, CT, nuclear med)  3rd Floor: - Vacant  4th Floor: - TCTS (cardiothoracic surgery) - AFib Clinic - Structural Heart Clinic - Vascular Surgery  - Vascular Ultrasound  5th Floor: - HeartCare Cardiology (general and EP) - Clinical Pharmacy for coumadin, hypertension, lipid, weight-loss medications, and med management appointments    Valet parking services will be available as well.

## 2023-12-16 NOTE — Progress Notes (Signed)
 Cardiology Office Note:    Date:  12/16/2023   ID:  Samantha Luna, DOB 1961-04-01, MRN [id number]  PCP:  Duwaine Annabella SAILOR, FNP  Cardiologist:  Dub Huntsman, DO  Electrophysiologist:  None   Referring MD: Lang Dedra DASEN, MD    I am having some chest wall pain  History of Present Illness:    Samantha Luna is a 63 y.o. female with a hx of hypertension, coronary artery disease, hyperlipidemia, obesity, meningioma, per care everywhere in [place]  2011 the patient was noted to have congestive heart failure.    Since I saw the patient she has been seen in our office for chest pain which was noted to be muscle spasm.  It has been a year since our last visit.  She shares with me she still experiencing a chronic shortness of breath and back spasm.  She reports occasional forgetfulness but does not express concern about this at this time.  She is experiencing some stress giving her nephew is currently awaiting heart surgery for blockage.  But she is very happy for the fact that she has been active with water  aerobics.   Past Medical History:  Diagnosis Date   Acute exacerbation of chronic obstructive airways disease (HCC) 11/09/2017   Hospitalization 11/09/17 in [place]  - See Care Everywhere   Allergic contact dermatitis 09/12/2021   Atherosclerosis of native arteries of extremities with intermittent claudication, right leg (HCC) 08/11/2019   Bell's palsy    Chest pain 05/07/2010   Complication of anesthesia    hysterectomy - slow to wake up   Congestive heart failure (HCC) 04/26/2010   Echo 11/13/20 in [place]  showed normal ejection fraction, normal echo.    COPD (chronic obstructive pulmonary disease) (HCC) 02/11/2016   Dyspnea    Essential hypertension 08/25/2021   Generalized anxiety disorder 01/06/2022   GERD (gastroesophageal reflux disease)    High cholesterol 02/11/2016   History of COVID-19 07/20/2020   Laryngopharyngeal reflux 08/14/2018   Major depressive disorder     Meningioma (HCC) 04/23/2010   01/28/13 Head CT: A left posterior parafalcine meningioma is seen that measures 1.5 cm,  previously measuring 1.3 cm. Basal cisterns and sulci are unremarkable. Impression:  Left posterior parafalcine meningioma is mildly increased in size since  prior without significant surrounding mass effect or acute process visualized.   Acute or subacute ischemia is not excluded with CT.   Mild cognitive impairment of uncertain or unknown etiology 12/19/2022   Mild persistent asthma 05/01/2018   Obstructive sleep apnea 08/18/2021   variable CPAP use   Osteoarthritis of right knee 11/10/2020   Peripheral vascular disease (HCC)    Pneumonia 2020   Pre-diabetes    Restrictive lung disease 03/05/2018    Past Surgical History:  Procedure Laterality Date   ABDOMINAL HYSTERECTOMY  2017   meniscus repair on right knee  Right 2017   TONSILLECTOMY     as a child   TOTAL KNEE ARTHROPLASTY Left 10/03/2021   Procedure: TOTAL KNEE ARTHROPLASTY;  Surgeon: Melodi Lerner, MD;  Location: [facility];  Service: Orthopedics;  Laterality: Left;   TOTAL KNEE ARTHROPLASTY Right 05/22/2022   Procedure: TOTAL KNEE ARTHROPLASTY;  Surgeon: Melodi Lerner, MD;  Location: [facility];  Service: Orthopedics;  Laterality: Right;    Current Medications: Current Meds  Medication Sig   albuterol  (PROVENTIL ) (2.5 MG/3ML) 0.083% nebulizer solution INHALE ONE VIAL VIA NEBULIZER EVERY 6 HOURS AS NEEDED FOR WHEEZING FOR SHORTNESS OF BREATH   aspirin  EC 81 MG tablet Take 81  mg by mouth daily. Swallow whole.   beclomethasone (QVAR  REDIHALER) 80 MCG/ACT inhaler 1-2 puffs twice a day. Rinse mouth with water , gargle and spit after use.   clotrimazole  (MYCELEX ) 10 MG troche Take 1 tablet (10 mg total) by mouth 5 (five) times daily.   escitalopram  (LEXAPRO ) 10 MG tablet Take 10 mg by mouth daily.   ibandronate (BONIVA) 150 MG tablet Take 150 mg by mouth every 30 (thirty) days.   losartan -hydrochlorothiazide  (HYZAAR)  100-25 MG tablet Take 1 tablet by mouth daily.   Melatonin-Pyridoxine (MELATIN PO) Take by mouth.   Multiple Vitamins-Minerals (MULTIVITAMIN WITH MINERALS) tablet Take 1 tablet by mouth daily.   MYRBETRIQ  50 MG TB24 tablet Take 50 mg by mouth every other day.   omeprazole (PRILOSEC) 20 MG capsule Take 20 mg by mouth daily.   rosuvastatin  (CRESTOR ) 5 MG tablet Take 5 mg by mouth daily.   tiZANidine (ZANAFLEX) 4 MG tablet take 1 tablet by mouth three times daily as needed for spasm   VENTOLIN  HFA 108 (90 Base) MCG/ACT inhaler INHALE 1 TO 2 PUFFS INTO LUNGS EVERY 6 HOURS AS NEEDED FOR WHEEZING OR SHORTNESS OF BREATH   [DISCONTINUED] nitroGLYCERIN  (NITROSTAT ) 0.4 MG SL tablet Place 0.4 mg under the tongue every 5 (five) minutes as needed for chest pain.     Allergies:   Iodine  and Codeine   Social History   Socioeconomic History   Marital status: Single    Spouse name: Not on file   Number of children: Not on file   Years of education: 12   Highest education level: High school graduate  Occupational History   Occupation: Retired    Comment: Midwife  Tobacco Use   Smoking status: Former    Current packs/day: 0.00    Average packs/day: 1 pack/day for 40.0 years (40.0 ttl pk-yrs)    Types: Cigarettes    Start date: 08/28/1981    Quit date: 08/28/2021    Years since quitting: 2.3   Smokeless tobacco: Never  Vaping Use   Vaping status: Never Used  Substance and Sexual Activity   Alcohol use: Not Currently    Comment: Very infrequent   Drug use: Not Currently    Types: Marijuana    Comment: Pt reports that a joint will last her several months   Sexual activity: Not Currently  Other Topics Concern   Not on file  Social History Narrative   Right handed    Social Drivers of Health   Financial Resource Strain: Not at Risk (07/11/2023)   Received from [employer] [facility]    Financial Resource Strain: 1  Food Insecurity: Not at Risk (07/14/2022)   Received from  [place], [place]   Food Insecurity    Food: 1  Transportation Needs: Not at Risk (07/11/2023)   Received from [facility] Needs    Transportation: 1  Physical Activity: Not at Risk (07/11/2023)   Received from [facility]   Physical Activity    Physical Activity: 1  Stress: Not at Risk (07/11/2023)   Received from [facility]   Stress    Stress: 1  Social Connections: Not at Risk (07/11/2023)   Received from [facility]   Social Connections    Connectedness: 1     Family History: The patient's family history includes Anxiety disorder in her daughter; Asthma in her daughter, daughter, grandson, and sister; Bipolar disorder in her daughter; Dementia in her father; Depression in her daughter and daughter; Immunodeficiency in her  daughter and grandson; Memory loss in her mother.  ROS:   Review of Systems  Constitution: Negative for decreased appetite, fever and weight gain.  HENT: Negative for congestion, ear discharge, hoarse voice and sore throat.   Eyes: Negative for discharge, redness, vision loss in right eye and visual halos.  Cardiovascular: Reports for chest pain.negative for dyspnea on exertion, leg swelling, orthopnea and palpitations.  Respiratory: Negative for cough, hemoptysis, shortness of breath and snoring.   Endocrine: Negative for heat intolerance and polyphagia.  Hematologic/Lymphatic: Negative for bleeding problem. Does not bruise/bleed easily.  Skin: Negative for flushing, nail changes, rash and suspicious lesions.  Musculoskeletal: Negative for arthritis, joint pain, muscle cramps, myalgias, neck pain and stiffness.  Gastrointestinal: Negative for abdominal pain, bowel incontinence, diarrhea and excessive appetite.  Genitourinary: Negative for decreased libido, genital sores and incomplete emptying.  Neurological: Negative for brief paralysis, focal weakness, headaches and loss of balance.  Psychiatric/Behavioral: Negative for altered mental status, depression and  suicidal ideas.  Allergic/Immunologic: Negative for HIV exposure and persistent infections.    EKGs/Labs/Other Studies Reviewed:    The following studies were reviewed today:   EKG:  The ekg ordered today demonstrates sinus rhythm, heart rate 81 bpm.  CCTA 08/18/2021 Coronary Arteries:  Normal coronary origin.  Right dominance.   RCA is a large dominant artery that gives rise to PDA and PLA. There is no plaque.   Left main is a large artery that gives rise to LAD and LCX arteries.   LAD is a large vessel that has no plaque.   LCX is a non-dominant artery that gives rise to one large OM1 branch. There is no plaque.   Other findings:   Left Ventricle: Normal size   Left Atrium: Normal size   Pulmonary Veins: Normal configuration   Right Ventricle: Normal size   Right Atrium: Normal size   Thoracic aorta: Normal size   Pulmonary Arteries: Normal size   Systemic Veins: Normal drainage   Pericardium: Normal thickness   IMPRESSION: 1. Coronary calcium  score of 0.   2. Normal coronary origin with right dominance.   3. No evidence of CAD. There are significant step artifacts due to cardiac motion, but no CAD is seen   CAD-RADS 0. No evidence of CAD (0%). Consider non-atherosclerotic causes of chest pain.     Electronically Signed   By: Lonni Nanas M.D.   On: 08/18/2021 12:17    Addended by Nanas Lonni CROME, MD on 08/18/2021 12:19 PM   Study Result  Narrative & Impression  EXAM: OVER-READ INTERPRETATION  CT CHEST   The following report is an over-read performed by radiologist Dr. Jackquline Boxer of [facility] on 08/18/2021. This over-read does not include interpretation of cardiac or coronary anatomy or pathology. The coronary calcium  score/coronary CTA interpretation by the cardiologist is attached.   COMPARISON:  None.   FINDINGS: Limited view of the lung parenchyma demonstrates no suspicious nodularity. Airways are  normal.   Limited view of the mediastinum demonstrates no adenopathy. Esophagus normal.   Limited view of the upper abdomen unremarkable.   Limited view of the skeleton and chest wall is unremarkable.   IMPRESSION: No significant extracardiac findings.   Electronically Signed: By: Jackquline Boxer M.D.    Recent Labs: No results found for requested labs within last 365 days.  Recent Lipid Panel    Component Value Date/Time   CHOL 143 08/18/2021 0244   TRIG 55 08/18/2021 0244   HDL 39 (L)  08/18/2021 0244   CHOLHDL 3.7 08/18/2021 0244   VLDL 11 08/18/2021 0244   LDLCALC 93 08/18/2021 0244    Physical Exam:    VS:  BP 120/76 (BP Location: Left Arm, Patient Position: Sitting, Cuff Size: Normal)   Pulse 83   Ht 5' 2 (1.575 m)   Wt 227 lb 12.8 oz (103.3 kg)   SpO2 92%   BMI 41.67 kg/m     Wt Readings from Last 3 Encounters:  12/14/23 227 lb 12.8 oz (103.3 kg)  10/05/23 229 lb (103.9 kg)  08/27/23 227 lb 12.8 oz (103.3 kg)     GEN: Well nourished, well developed in no acute distress HEENT: Normal NECK: No JVD; No carotid bruits LYMPHATICS: No lymphadenopathy CARDIAC: S1S2 noted,RRR, no murmurs, rubs, gallops RESPIRATORY:  Clear to auscultation without rales, wheezing or rhonchi  ABDOMEN: Soft, non-tender, non-distended, +bowel sounds, no guarding. EXTREMITIES: No edema, No cyanosis, no clubbing MUSCULOSKELETAL:  No deformity  SKIN: Warm and dry NEUROLOGIC:  Alert and oriented x 3, non-focal PSYCHIATRIC:  Normal affect, good insight  ASSESSMENT:    1. Mixed hyperlipidemia    PLAN:     Mild carotid artery disease-she is on aspirin  and Crestor .  Hypertension Blood pressure at target continue current regimen with losartan  hydrochlorothiazide   Hypercholesterolemia Reviewed blood work which was done by PCP no need for further lipid profile to be completed  Diabetes mellitus Managed with medication. No specific issues or changes discussed.  Restrictive  lung disease-I will defer to her pulmonary team for this  OSA she is on CPAP.  This is being managed by the pulmonary team as well.   Follow-up Annual follow-up visit with cardiologist. Advised to contact the office if experiencing chest pain, shortness of breath, or syncope. - Schedule follow-up appointment in one year. - Instruct to call the office if experiencing chest pain, shortness of breath, or syncope. The patient is in agreement with the above plan. The patient left the office in stable condition.  The patient will follow up in 1 year or sooner   Medication Adjustments/Labs and Tests Ordered: Current medicines are reviewed at length with the patient today.  Concerns regarding medicines are outlined above.  Orders Placed This Encounter  Procedures   Lipid panel   No orders of the defined types were placed in this encounter.   Patient Instructions  Medication Instructions:  Your physician has recommended you make the following change in your medication: STOP: Nitroglycerin    Lab Work: Lipids If you have labs (blood work) drawn today and your tests are completely normal, you will receive your results only by: MyChart Message (if you have MyChart) OR A paper copy in the mail If you have any lab test that is abnormal or we need to change your treatment, we will call you to review the results.  Follow-Up: At [facility], you and your health needs are our priority.  As part of our continuing mission to provide you with exceptional heart care, our providers are all part of one team.  This team includes your primary Cardiologist (physician) and Advanced Practice Providers or APPs (Physician Assistants and Nurse Practitioners) who all work together to provide you with the care you need, when you need it.  Your next appointment:   1 year(s)  Provider:   Saheed Carrington, DO    Other Instructions:   1st Floor: - Lobby - Registration  - Pharmacy  - Lab - Cafe  2nd  Floor: - PV Lab -  Diagnostic Testing (echo, CT, nuclear med)  3rd Floor: - Vacant  4th Floor: - TCTS (cardiothoracic surgery) - AFib Clinic - Structural Heart Clinic - Vascular Surgery  - Vascular Ultrasound  5th Floor: - HeartCare Cardiology (general and EP) - Clinical Pharmacy for coumadin, hypertension, lipid, weight-loss medications, and med management appointments    Valet parking services will be available as well.      Adopting a Healthy Lifestyle.  Know what a healthy weight is for you (roughly BMI <25) and aim to maintain this   Aim for 7+ servings of fruits and vegetables daily   65-80+ fluid ounces of water  or unsweet tea for healthy kidneys   Limit to max 1 drink of alcohol per day; avoid smoking/tobacco   Limit animal fats in diet for cholesterol and heart health - choose grass fed whenever available   Avoid highly processed foods, and foods high in saturated/trans fats   Aim for low stress - take time to unwind and care for your mental health   Aim for 150 min of moderate intensity exercise weekly for heart health, and weights twice weekly for bone health   Aim for 7-9 hours of sleep daily   When it comes to diets, agreement about the perfect plan isnt easy to find, even among the experts. Experts at the [facility] [employer] developed an idea known as the Healthy Eating Plate. Just imagine a plate divided into logical, healthy portions.   The emphasis is on diet quality:   Load up on vegetables and fruits - one-half of your plate: Aim for color and variety, and remember that potatoes dont count.   Go for whole grains - one-quarter of your plate: Whole wheat, barley, wheat berries, quinoa, oats, brown rice, and foods made with them. If you want pasta, go with whole wheat pasta.   Protein power - one-quarter of your plate: Fish, chicken, beans, and nuts are all healthy, versatile protein sources. Limit red meat.   The diet, however, does  go beyond the plate, offering a few other suggestions.   Use healthy plant oils, such as olive, canola, soy, corn, sunflower and peanut. Check the labels, and avoid partially hydrogenated oil, which have unhealthy trans fats.   If youre thirsty, drink water . Coffee and tea are good in moderation, but skip sugary drinks and limit milk and dairy products to one or two daily servings.   The type of carbohydrate in the diet is more important than the amount. Some sources of carbohydrates, such as vegetables, fruits, whole grains, and beans-are healthier than others.   Finally, stay active  Bonney Dub Huntsman, DO  12/16/2023 12:26 PM    [facility]

## 2024-01-01 ENCOUNTER — Ambulatory Visit (INDEPENDENT_AMBULATORY_CARE_PROVIDER_SITE_OTHER): Payer: 59 | Admitting: Neurology

## 2024-01-01 ENCOUNTER — Encounter: Payer: Self-pay | Admitting: Neurology

## 2024-01-01 VITALS — BP 121/66 | HR 87 | Ht 62.0 in | Wt 227.0 lb

## 2024-01-01 DIAGNOSIS — G3184 Mild cognitive impairment, so stated: Secondary | ICD-10-CM

## 2024-01-01 NOTE — Patient Instructions (Signed)
 Good to see you doing well! Continue all your medications. Continue control of blood pressure, glucose, cholesterol levels. Follow-up in 1 year, call for any changes.    RECOMMENDATIONS FOR ALL PATIENTS WITH MEMORY PROBLEMS: 1. Continue to exercise (Recommend 30 minutes of walking everyday, or 3 hours every week) 2. Increase social interactions - continue going to [place] and enjoy social gatherings with friends and family 3. Eat healthy, avoid fried foods and eat more fruits and vegetables 4. Maintain adequate blood pressure, blood sugar, and blood cholesterol level. Reducing the risk of stroke and cardiovascular disease also helps promoting better memory. 5. Avoid stressful situations. Live a simple life and avoid aggravations. Organize your time and prepare for the next day in anticipation. 6. Sleep well, avoid any interruptions of sleep and avoid any distractions in the bedroom that may interfere with adequate sleep quality 7. Avoid sugar, avoid sweets as there is a strong link between excessive sugar intake, diabetes, and cognitive impairment The Mediterranean diet has been shown to help patients reduce the risk of progressive memory disorders and reduces cardiovascular risk. This includes eating fish, eat fruits and green leafy vegetables, nuts like almonds and hazelnuts, walnuts, and also use olive oil. Avoid fast foods and fried foods as much as possible. Avoid sweets and sugar as sugar use has been linked to worsening of memory function.

## 2024-01-01 NOTE — Progress Notes (Signed)
 NEUROLOGY FOLLOW UP OFFICE NOTE  Samantha Luna [id number] [birth year]  HISTORY OF PRESENT ILLNESS: I had the pleasure of seeing Samantha Luna in follow-up in the neurology clinic on 01/01/2024.  The patient was last seen 7 months ago for Mild Neurocognitive disorder. She is alone in the office today.  Records and images were personally reviewed where available.  Repeat Neuropsychological testing in April 2024 again indicated a diagnosis of Mild Neurocognitive Disorder. There was note of an isolated impairment surrounding receptive language, this was where greatest decline was compared to evaluation in 2023. Etiology difficult to pinpoint, there remains potential for a prominent cerebrovascular contribution, cognitive profile was not suggestive of Alzheimer's disease. It was also noted that while she did not report significant symptoms of depression or anxiety, she strongly alluded to numerous ongoing family dynamics which are likely the source of quite significant distress.   Since her last visit, she reports feeling well, "I am at peace with myself." She lives alone. She feels her memory is "alright." She states family has not expressed new concerns. She denies getting lost driving. She usually takes her medications in the morning, if she forgets it then, she would take it later in the day. She forgot one bill "but it came back." She denies leaving the stove on. She is independent with dressing and bathing. Mood is okay. She had a dental procedure where a tooth was sawed down too much, affecting her bite and causing headaches. No dizziness, vision changes, no falls. She has intermittent left hand numbness, stiffness and cramping. It usually occurs when laying on her right side. She switches positions and symptoms ease off. No neck pain. Glucose levels have been between 90-120.    HPI 11/01/2022: Patient was initially seen by [facility] Disorders PA Samantha Luna in 07/2021 for memory loss, MoCA was 23/30. I  personally reviewed brain MRI without contrast done 08/2021, no acute changes, there was a hypointense mass on the left and superior to the torcula measuring 13mm with CSF cleft consistent with meningioma, mild chronic microvascular disease in the periventricular white matter and more moderate in the pons. She underwent Neuropsychological testing in 12/2021 which was largely within normal limits, primary weakness in encoding novel verbal information and variability across executive functioning. It was felt there could be a vascular contribution to ongoing dysfunction. She also reported mild to moderate psychiatric distress, mild sleep dysfunction, and ongoing chronic pain which can create and/or worsen cognitive inefficiencies. On follow-up with Samantha Luna in January, daughter expressed concern that vascular issues are causing mood changes. Repeat MRI brain with and without contrast was done 10/16/22 with no significant change from prior scan. They present to discuss results and concerns.  She feels her memory is "okay." Her daughter started noticing memory changes around 3 years ago where she would repeat the same stories within 5 minutes in the same conversation. She moved from [place]  to live with her daughter almost 2 years ago, and Samantha Luna noticed that she would get confused with medications, she would continue taking it even when instructed to stop it, or need reminds that she is taking one medication and not another. She would take medication twice a day instead of once a day, or say she does not want to take all of them. She felt like she was overmedicated. She would take vinegar before her medications and admit to forgetting medication some weeks. She admits to forgetting boll payments, Samantha Luna recalls she forgot to pay her car insurance 1-2 months  ago and had to fix it, she states she was about to get a new car and did not want to pay twice. She denies getting lost driving, Samantha Luna reminds her she got  lost 2 days ago going to her daughter;s house, she says she was [place] different [place]. No hygiene concerns, she is independent with dressing and bathing. She denies leaving the stove on or misplacing things frequently. Her father, mother, and sister had dementia. No alcohol use. She usually gets 4-6 hours of sleep. She lives alone. She reports mood is great. Samantha Luna thinks mood is a whole lot better, she is happier/more cheerful since knee surgery in 04/2022. No hallucinations or paranoia. She feels like she has racing thoughts a lot of times, getting confused a lot. She states "sometimes pieces are not coming together." She was told by her sleep specialist that "you sleep but you're not sleeping, brain is constantly going." Samantha Luna notes she has been through a lot and never processed it. There may be some unprocessed trauma.    PAST MEDICAL HISTORY: Past Medical History:  Diagnosis Date   Acute exacerbation of chronic obstructive airways disease (HCC) 11/09/2017   Hospitalization 11/09/17 in [place]  - See Care Everywhere   Allergic contact dermatitis 09/12/2021   Atherosclerosis of native arteries of extremities with intermittent claudication, right leg (HCC) 08/11/2019   Bell's palsy    Chest pain 05/07/2010   Complication of anesthesia    hysterectomy - slow to wake up   Congestive heart failure (HCC) 04/26/2010   Echo 11/13/20 in [place]  showed normal ejection fraction, normal echo.    COPD (chronic obstructive pulmonary disease) (HCC) 02/11/2016   Dyspnea    Essential hypertension 08/25/2021   Generalized anxiety disorder 01/06/2022   GERD (gastroesophageal reflux disease)    High cholesterol 02/11/2016   History of COVID-19 07/20/2020   Laryngopharyngeal reflux 08/14/2018   Major depressive disorder    Meningioma (HCC) 04/23/2010   01/28/13 Head CT: A left posterior parafalcine meningioma is seen that measures 1.5 cm,  previously measuring 1.3 cm. Basal cisterns and  sulci are unremarkable. Impression:  Left posterior parafalcine meningioma is mildly increased in size since  prior without significant surrounding mass effect or acute process visualized.   Acute or subacute ischemia is not excluded with CT.   Mild cognitive impairment of uncertain or unknown etiology 12/19/2022   Mild persistent asthma 05/01/2018   Obstructive sleep apnea 08/18/2021   variable CPAP use   Osteoarthritis of right knee 11/10/2020   Peripheral vascular disease (HCC)    Pneumonia 2020   Pre-diabetes    Restrictive lung disease 03/05/2018    MEDICATIONS: Current Outpatient Medications on File Prior to Visit  Medication Sig Dispense Refill   albuterol  (PROVENTIL ) (2.5 MG/3ML) 0.083% nebulizer solution INHALE ONE VIAL VIA NEBULIZER EVERY 6 HOURS AS NEEDED FOR WHEEZING FOR SHORTNESS OF BREATH 300 mL 2   aspirin  EC 81 MG tablet Take 81 mg by mouth daily. Swallow whole.     beclomethasone (QVAR  REDIHALER) 80 MCG/ACT inhaler 1-2 puffs twice a day. Rinse mouth with water , gargle and spit after use. 10.6 g 2   clotrimazole  (MYCELEX ) 10 MG troche Take 1 tablet (10 mg total) by mouth 5 (five) times daily. 70 tablet 0   escitalopram  (LEXAPRO ) 10 MG tablet Take 10 mg by mouth daily.     ibandronate (BONIVA) 150 MG tablet Take 150 mg by mouth every 30 (thirty) days.     losartan -hydrochlorothiazide  (HYZAAR) 100-25 MG  tablet Take 1 tablet by mouth daily.     Melatonin-Pyridoxine (MELATIN PO) Take by mouth.     Multiple Vitamins-Minerals (MULTIVITAMIN WITH MINERALS) tablet Take 1 tablet by mouth daily.     MYRBETRIQ  50 MG TB24 tablet Take 50 mg by mouth every other day.     omeprazole (PRILOSEC) 20 MG capsule Take 20 mg by mouth daily.     rosuvastatin  (CRESTOR ) 5 MG tablet Take 5 mg by mouth daily.     Spacer/Aero-Holding Chambers (AEROCHAMBER HOLDING CHAMBER) DEVI 1 Device by Does not apply route once for 1 dose. 1 each 0   tiZANidine (ZANAFLEX) 4 MG tablet take 1 tablet by mouth three  times daily as needed for spasm     VENTOLIN  HFA 108 (90 Base) MCG/ACT inhaler INHALE 1 TO 2 PUFFS INTO LUNGS EVERY 6 HOURS AS NEEDED FOR WHEEZING OR SHORTNESS OF BREATH 18 g 3   No current facility-administered medications on file prior to visit.    ALLERGIES: Allergies  Allergen Reactions   Iodine  Other (See Comments)    Uncontrollable sneezing    Codeine Itching    FAMILY HISTORY: Family History  Problem Relation Age of Onset   Memory loss Mother    Dementia Father    Asthma Sister    Immunodeficiency Daughter    Asthma Daughter    Anxiety disorder Daughter    Depression Daughter    Depression Daughter    Asthma Daughter    Bipolar disorder Daughter    Immunodeficiency Grandson    Asthma Grandson     SOCIAL HISTORY: Social History   Socioeconomic History   Marital status: Single    Spouse name: Not on file   Number of children: Not on file   Years of education: 12   Highest education level: High school graduate  Occupational History   Occupation: Retired    Comment: Midwife  Tobacco Use   Smoking status: Former    Current packs/day: 0.00    Average packs/day: 1 pack/day for 40.0 years (40.0 ttl pk-yrs)    Types: Cigarettes    Start date: 08/28/1981    Quit date: 08/28/2021    Years since quitting: 2.3   Smokeless tobacco: Never  Vaping Use   Vaping status: Never Used  Substance and Sexual Activity   Alcohol use: Not Currently    Comment: Very infrequent   Drug use: Not Currently    Types: Marijuana    Comment: Pt reports that a joint will last her several months   Sexual activity: Not Currently  Other Topics Concern   Not on file  Social History Narrative   Right handed    Social Drivers of Health   Financial Resource Strain: Not at Risk (07/11/2023)   Received from [facility]    Financial Resource Strain: 1  Food Insecurity: Not at Risk (07/14/2022)   Received from [place], [place]   Food Insecurity    Food: 1   Transportation Needs: Not at Risk (07/11/2023)   Received from [facility] Needs    Transportation: 1  Physical Activity: Not at Risk (07/11/2023)   [facility]   Physical Activity    Physical Activity: 1  Stress: Not at Risk (07/11/2023)   Received from [facility]   Stress    Stress: 1  Social Connections: Not at Risk (07/11/2023)   Received from [facility]   Social Connections    Connectedness: 1  Intimate Partner Violence: Not  At Risk (05/22/2022)   Humiliation, Afraid, Rape, and Kick questionnaire    Fear of Current or Ex-Partner: No    Emotionally Abused: No    Physically Abused: No    Sexually Abused: No     PHYSICAL EXAM: Vitals:   01/01/24 0838  BP: 121/66  Pulse: 87  SpO2: 96%   General: No acute distress Head:  Normocephalic/atraumatic Skin/Extremities: No rash, no edema Neurological Exam: alert and oriented to person, place, and time. No aphasia or dysarthria. Fund of knowledge is appropriate.  Recent and remote memory are intact.  Attention and concentration are normal.  MMSE 30/30    01/01/2024    9:00 AM  MMSE - Mini Mental State Exam  Orientation to time 5  Orientation to Place 5  Registration 3  Attention/ Calculation 5  Recall 3  Language- name 2 objects 2  Language- repeat 1  Language- follow 3 step command 3  Language- read & follow direction 1  Write a sentence 1  Copy design 1  Total score 30   Cranial nerves: Pupils equal, round. Extraocular movements intact with no nystagmus. Visual fields full.  No facial asymmetry.  Motor: Bulk and tone normal, muscle strength 5/5 throughout with no pronator drift.   Finger to nose testing intact.  Gait narrow-based and steady,no ataxia. No tremors. Romberg negative (however patient reports feeling a little movement with eyes closed).   IMPRESSION: This is a 63 yo RH woman with a history of hypertension, hyperlipidemia, DM2, with Mild Neurocognitive Disorder on repeat Neuropsychological  evaluation in April 2024, etiology continues to be difficult to pinpoint, there remains potential for prominent cerebrovascular contribution, as well as possibly family dynamics contributing to distress. She reports doing well, denies any difficulty with complex tasks. MMSE today 30/30. We discussed continued control of vascular risk factors, physical and brain stimulation exercises for overall brain health. She would like to do annual visits and knows to call for any changes.   Thank you for allowing me to participate in her care.  Please do not hesitate to call for any questions or concerns.    Rayfield Cairo, M.D.   CC: Denece Finger, FNP

## 2024-01-09 ENCOUNTER — Other Ambulatory Visit: Payer: Self-pay | Admitting: Pulmonary Disease

## 2024-01-09 NOTE — Telephone Encounter (Unsigned)
 Copied from CRM [id number]. Topic: Clinical - Medication Refill >> Jan 09, 2024  3:21 PM Whitney O wrote: Medication: beclomethasone (QVAR  REDIHALER) 80 MCG/ACT inhaler   Has the patient contacted their pharmacy? Yes the pharmacy is calling for patient  (Agent: If no, request that the patient contact the pharmacy for the refill. If patient does not wish to contact the pharmacy document the reason why and proceed with request.) (Agent: If yes, when and what did the pharmacy advise?)  This is the patient's preferred pharmacy:  [facility] - [place] - [address] [place] [address] Phone: [phone] Fax: [phone]  [facility] [place] - [facility] [address] [place] [address] Phone: [phone] Fax: [phone]  [facility] - [place], [place] - [address] [place] [address] Phone: [phone] Fax: [phone]  Is this the correct pharmacy for this prescription? Yes If no, delete pharmacy and type the correct one.  [facility] - [place] - [address] [place] [address] Phone: [phone] Fax: [phone]   Has the prescription been filled recently? No  Is the patient out of the medication? Yes  Has the patient been seen for an appointment in the last year OR does the patient have an upcoming appointment? No last time seen was on 08/27/2023  Can we respond through MyChart? Yes  Agent: Please be advised that Rx refills may take up to 3 business days. We ask that you follow-up with your pharmacy.

## 2024-01-10 MED ORDER — QVAR REDIHALER 80 MCG/ACT IN AERB: INHALATION_SPRAY | RESPIRATORY_TRACT | 2 refills | Status: DC

## 2024-01-13 ENCOUNTER — Other Ambulatory Visit: Payer: Self-pay | Admitting: Pulmonary Disease

## 2024-01-16 ENCOUNTER — Other Ambulatory Visit: Payer: Self-pay | Admitting: Family

## 2024-01-16 DIAGNOSIS — M542 Cervicalgia: Secondary | ICD-10-CM

## 2024-01-17 ENCOUNTER — Ambulatory Visit
Admission: RE | Admit: 2024-01-17 | Discharge: 2024-01-17 | Disposition: A | Discharge status: Home / Self Care | Source: Ambulatory Visit | Attending: Family | Admitting: Family

## 2024-01-17 DIAGNOSIS — M542 Cervicalgia: Secondary | ICD-10-CM

## 2024-01-20 IMAGING — CT CT ANGIO CHEST
2 of 8 series · 19 of 36 positions shown · IV contrast (Omnipaque)
Comparison: 08/18/2021

CLINICAL DATA: Pulmonary embolism (PE) suspected, positive D-dimer

EXAM:
CT ANGIOGRAPHY CHEST WITH CONTRAST
TECHNIQUE: Multidetector CT imaging of the chest was performed using the
standard protocol during bolus administration of intravenous
contrast. Multiplanar CT image reconstructions and MIPs were
obtained to evaluate the vascular anatomy.
RADIATION DOSE REDUCTION: This exam was performed according to the
departmental dose-optimization program which includes automated
exposure control, adjustment of the mA and/or kV according to
patient size and/or use of iterative reconstruction technique.
CONTRAST:  75 mL Omnipaque 350 IV

[Series 7: pe thins · axial · 0.87mm/px · z∈[+936,+1160]mm · 18 of 253 slices shown]
[im 14/253  lung]
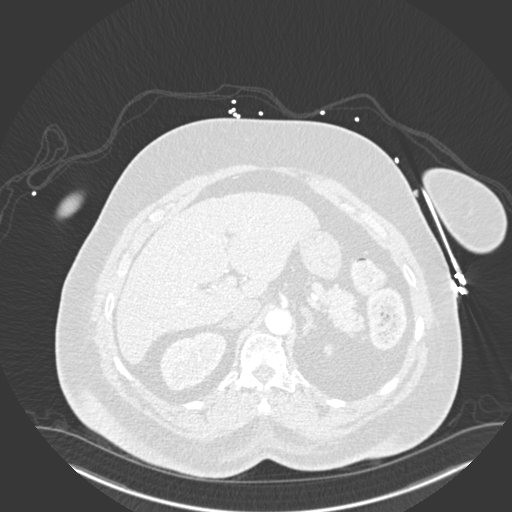
[im 27/253  mediastinal]
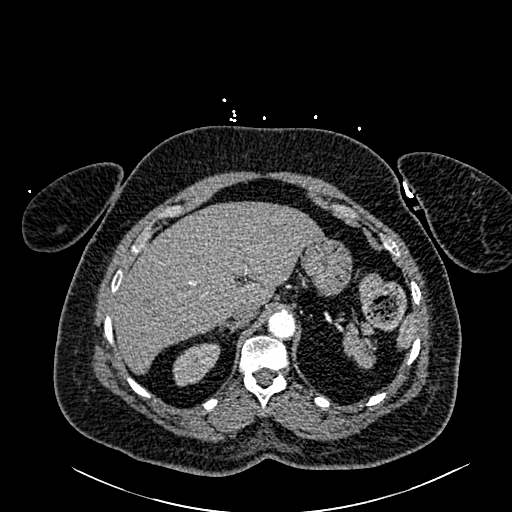
[im 40/253  lung]
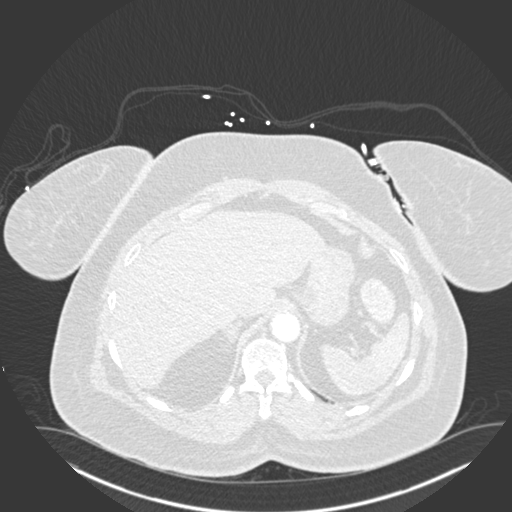
[im 54/253  mediastinal]
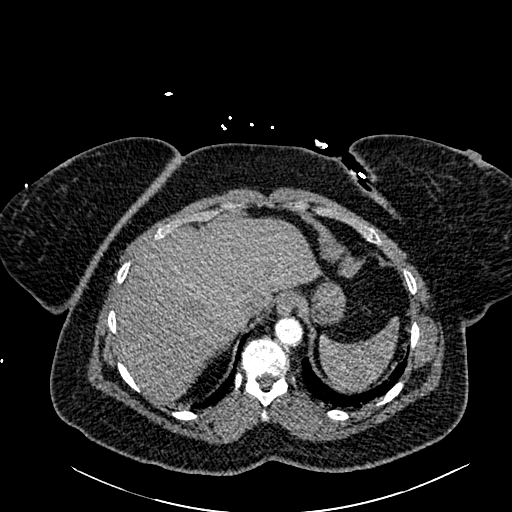
[im 67/253  lung]
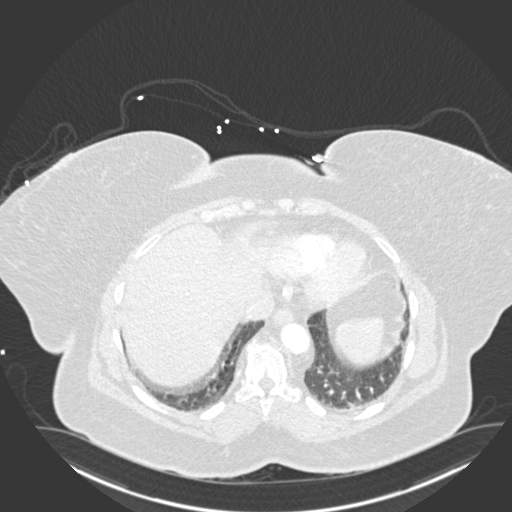
[im 80/253  mediastinal]
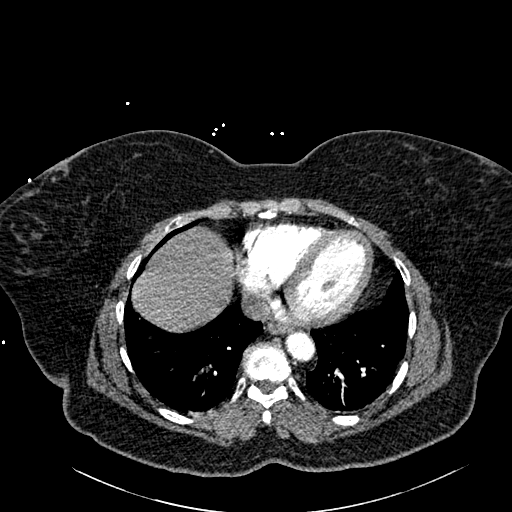
[im 93/253  lung]
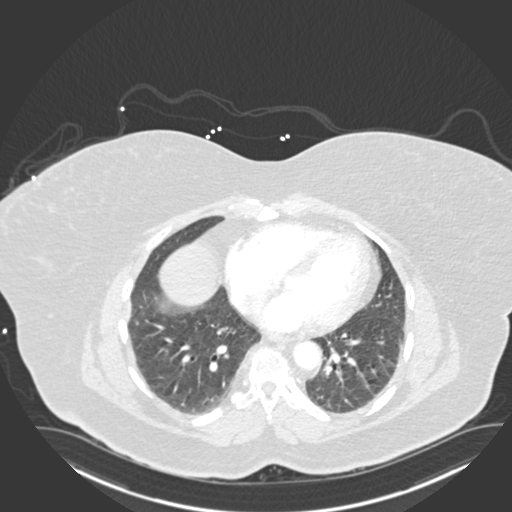
[im 107/253  mediastinal]
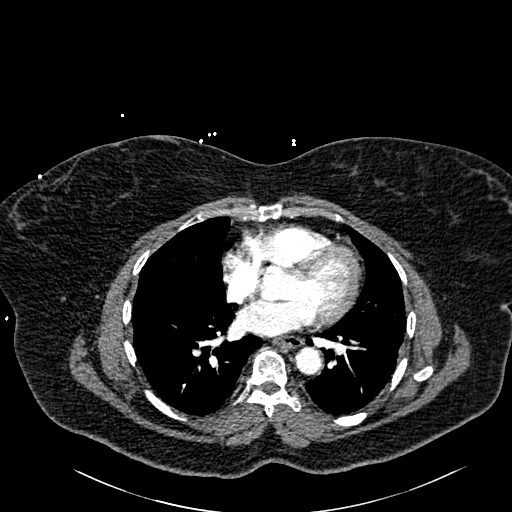
[im 120/253  lung]
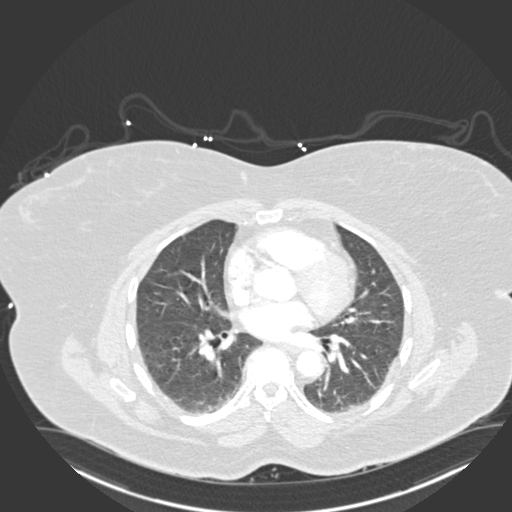
[im 133/253  mediastinal]
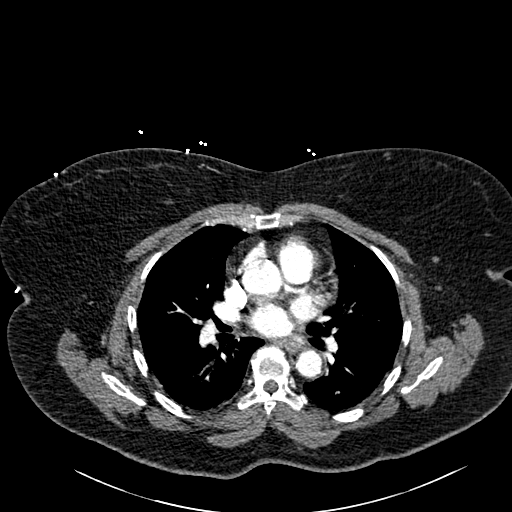
[im 146/253  lung]
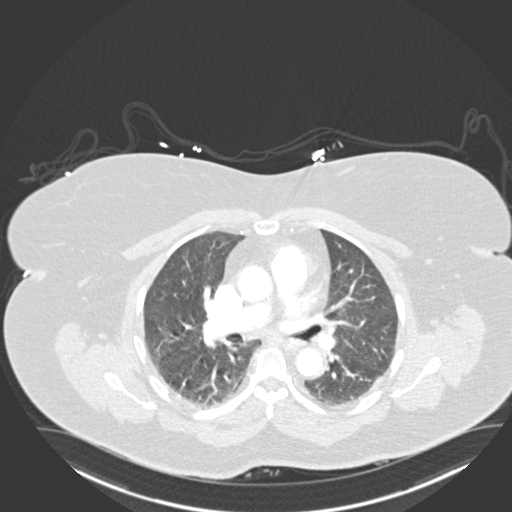
[im 160/253  mediastinal]
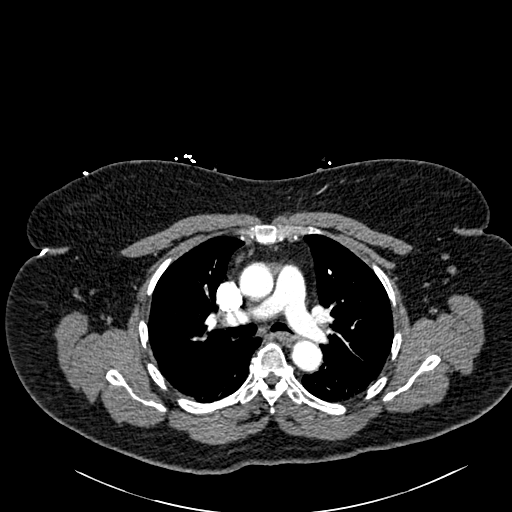
[im 173/253  lung]
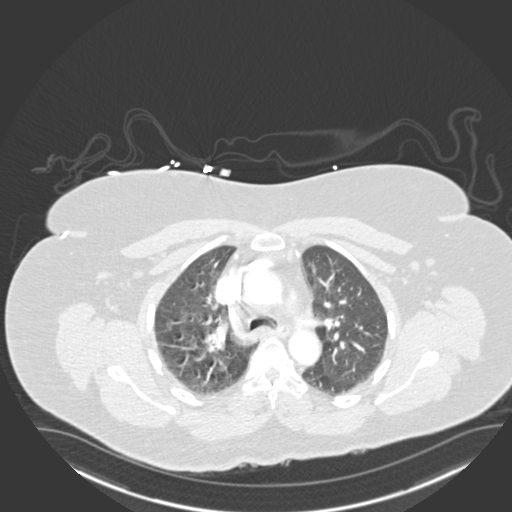
[im 186/253  mediastinal]
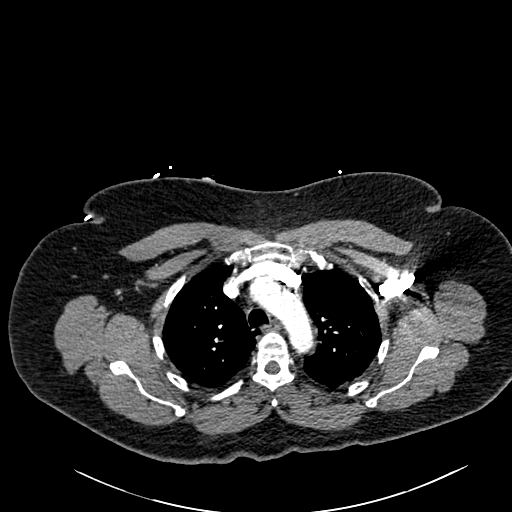
[im 199/253  lung]
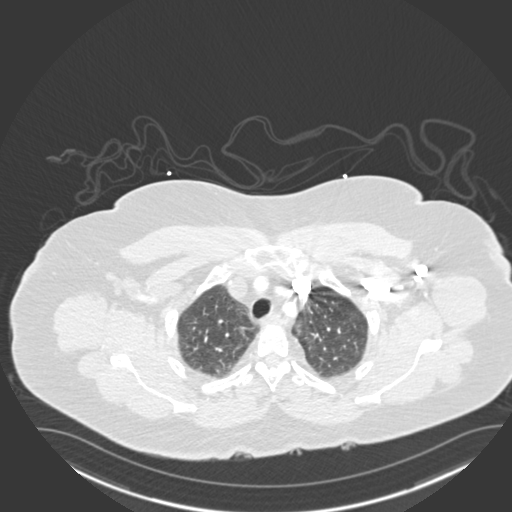
[im 213/253  mediastinal]
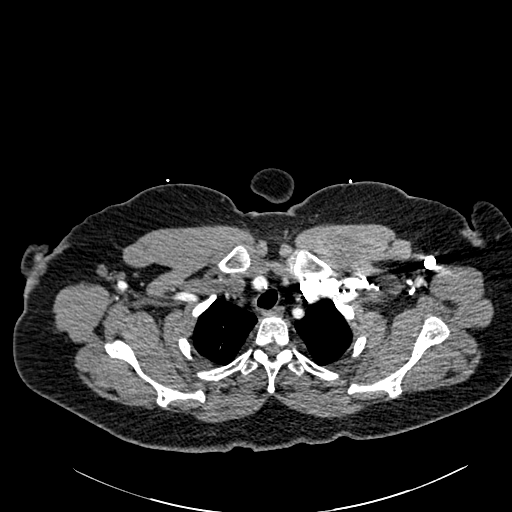
[im 226/253  lung]
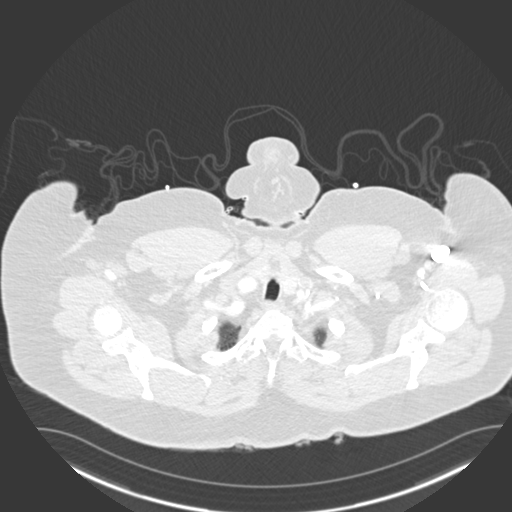
[im 239/253  mediastinal]
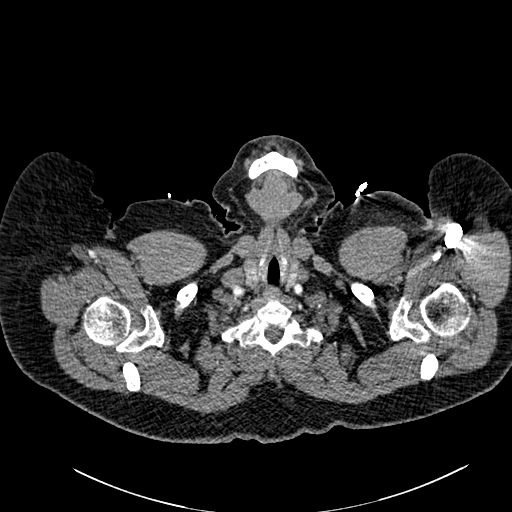

[Series 8: pe coronal mpr · coronal · 0.52mm/px · 1 of 122 slices shown]
[im 61/122  mediastinal]
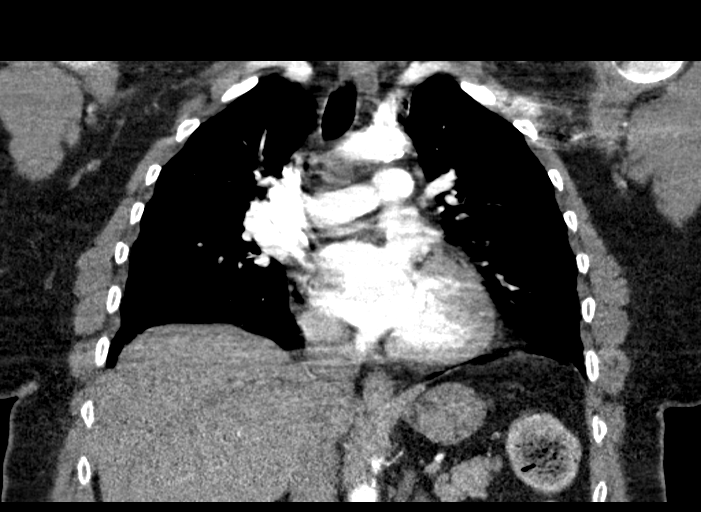

[19 of 36 positions shown; findings below may reference images not displayed]

FINDINGS: Cardiovascular: No filling defects in the pulmonary arteries to
suggest pulmonary emboli. Heart is normal size. Aorta is normal
caliber.

Mediastinum/Nodes: No mediastinal, hilar, or axillary adenopathy.
Trachea and esophagus are unremarkable. Thyroid unremarkable

Lungs/Pleura: Lungs are clear. No focal airspace opacities or
suspicious nodules. No effusions.

Upper Abdomen: No acute abnormality

Musculoskeletal: Chest wall soft tissues are unremarkable. No acute
bony abnormality.

Review of the MIP images confirms the above findings.
IMPRESSION: No evidence of pulmonary embolus.

No acute cardiopulmonary disease.

## 2024-01-30 ENCOUNTER — Other Ambulatory Visit: Payer: Self-pay | Admitting: Pulmonary Disease

## 2024-01-30 ENCOUNTER — Other Ambulatory Visit: Payer: Self-pay

## 2024-01-30 ENCOUNTER — Emergency Department (HOSPITAL_BASED_OUTPATIENT_CLINIC_OR_DEPARTMENT_OTHER)
Admission: EM | Admit: 2024-01-30 | Discharge: 2024-01-30 | Disposition: A | Discharge status: Home / Self Care | Attending: Emergency Medicine | Admitting: Emergency Medicine

## 2024-01-30 ENCOUNTER — Emergency Department (HOSPITAL_BASED_OUTPATIENT_CLINIC_OR_DEPARTMENT_OTHER): Admitting: Radiology

## 2024-01-30 ENCOUNTER — Encounter (HOSPITAL_BASED_OUTPATIENT_CLINIC_OR_DEPARTMENT_OTHER): Payer: Self-pay | Admitting: Emergency Medicine

## 2024-01-30 DIAGNOSIS — I110 Hypertensive heart disease with heart failure: Secondary | ICD-10-CM | POA: Insufficient documentation

## 2024-01-30 DIAGNOSIS — J449 Chronic obstructive pulmonary disease, unspecified: Secondary | ICD-10-CM | POA: Insufficient documentation

## 2024-01-30 DIAGNOSIS — J4541 Moderate persistent asthma with (acute) exacerbation: Secondary | ICD-10-CM | POA: Insufficient documentation

## 2024-01-30 DIAGNOSIS — Z79899 Other long term (current) drug therapy: Secondary | ICD-10-CM | POA: Diagnosis not present

## 2024-01-30 DIAGNOSIS — R059 Cough, unspecified: Secondary | ICD-10-CM | POA: Diagnosis present

## 2024-01-30 DIAGNOSIS — I509 Heart failure, unspecified: Secondary | ICD-10-CM | POA: Insufficient documentation

## 2024-01-30 DIAGNOSIS — J069 Acute upper respiratory infection, unspecified: Secondary | ICD-10-CM | POA: Insufficient documentation

## 2024-01-30 DIAGNOSIS — Z7982 Long term (current) use of aspirin: Secondary | ICD-10-CM | POA: Diagnosis not present

## 2024-01-30 DIAGNOSIS — E119 Type 2 diabetes mellitus without complications: Secondary | ICD-10-CM | POA: Diagnosis not present

## 2024-01-30 DIAGNOSIS — Z7951 Long term (current) use of inhaled steroids: Secondary | ICD-10-CM | POA: Insufficient documentation

## 2024-01-30 LAB — RESP PANEL BY RT-PCR (RSV, FLU A&B, COVID)  RVPGX2
Influenza A by PCR: NEGATIVE
Influenza B by PCR: NEGATIVE
Resp Syncytial Virus by PCR: NEGATIVE
SARS Coronavirus 2 by RT PCR: NEGATIVE

## 2024-01-30 MED ORDER — ALBUTEROL SULFATE (2.5 MG/3ML) 0.083% IN NEBU
INHALATION_SOLUTION | RESPIRATORY_TRACT | Status: AC
  Filled 2024-01-30: qty 6

## 2024-01-30 MED ORDER — ALBUTEROL SULFATE (2.5 MG/3ML) 0.083% IN NEBU
2.5000 mg | INHALATION_SOLUTION | Freq: Once | RESPIRATORY_TRACT | Status: AC
  Administered 2024-01-30: 5 mg via RESPIRATORY_TRACT

## 2024-01-30 MED ORDER — PREDNISONE 10 MG PO TABS: 40.0000 mg | ORAL_TABLET | Freq: Every day | ORAL | 0 refills | Status: AC

## 2024-01-30 MED ORDER — PREDNISONE 50 MG PO TABS
60.0000 mg | ORAL_TABLET | Freq: Once | ORAL | Status: AC
  Administered 2024-01-30: 60 mg via ORAL
  Filled 2024-01-30: qty 1

## 2024-01-30 NOTE — ED Triage Notes (Signed)
 Pt via pov from home with generalized body aches and cough x 1 week. Pt was in [place]  and just flew home. Pt states she has been taking OTC meds with no help. Pt alert & oriented, nad noted.

## 2024-01-30 NOTE — ED Provider Notes (Signed)
 [place] EMERGENCY DEPARTMENT AT [facility] Provider Note   CSN: [id number] Arrival date & time: 01/30/24  1202     History  Chief Complaint  Patient presents with   Cough   Generalized Body Aches    Samantha Luna is a 63 y.o. female.  63 year old female with past medical history of CHF, COPD, diabetes, GERD, OSA, HLD, HTN, asthma presents with complaint of cough, fever, sneezing.  Patient reports taking a flight to [place]  1 week ago and someone on the plane near her coughed the entire flight.  She states that she got off the flight she started sneezing and has been sick since that time.  Reports fever of 102 last night.  Has been using home nebulizer without significant relief.  Feels like she is tight and wheezing currently.  No lower extremity edema.  No other complaints or concerns.       Home Medications Prior to Admission medications   Medication Sig Start Date End Date Taking? Authorizing Provider  levocetirizine (XYZAL) 5 MG tablet 5 mg every evening. 01/08/24  Yes [provider]  MOUNJARO 2.5 MG/0.5ML Pen Inject 2.5 mg into the skin once a week. 01/11/24  Yes [provider]  predniSONE  (DELTASONE ) 10 MG tablet Take 4 tablets (40 mg total) by mouth daily for 4 days. 01/30/24 02/03/24 Yes Darlis Eisenmenger, PA-C  Semaglutide,0.25 or 0.5MG /DOS, 2 MG/3ML SOPN Inject 0.25 mg into the skin once a week. 01/16/24  Yes [provider]  albuterol  (PROVENTIL ) (2.5 MG/3ML) 0.083% nebulizer solution INHALE THE CONTENTS OF 1 VIAL VIA NEBULIZER EVERY 6 HOURS AS NEEDED FOR WHEEZING & SHORTNESS OF BREATH 01/30/24   Myer Artis A, MD  aspirin  EC 81 MG tablet Take 81 mg by mouth daily. Swallow whole.    [provider]  beclomethasone (QVAR  REDIHALER) 80 MCG/ACT inhaler 1-2 puffs twice a day. Rinse mouth with water , gargle and spit after use. 01/10/24   Raejean Bullock, NP  escitalopram  (LEXAPRO ) 10 MG tablet Take 10 mg by mouth daily.    [provider]  ibandronate (BONIVA) 150 MG tablet Take 150 mg by mouth every 30 (thirty) days.    [provider]  losartan -hydrochlorothiazide  (HYZAAR) 100-25 MG tablet Take 1 tablet by mouth daily.    [provider]  Melatonin-Pyridoxine (MELATIN PO) Take by mouth. Patient not taking: Reported on 01/01/2024    [provider]  Multiple Vitamins-Minerals (MULTIVITAMIN WITH MINERALS) tablet Take 1 tablet by mouth daily.    [provider]  MYRBETRIQ  50 MG TB24 tablet Take 50 mg by mouth every other day. 02/02/22   [provider]  omeprazole (PRILOSEC) 20 MG capsule Take 20 mg by mouth daily. 11/05/20   [provider]  rosuvastatin  (CRESTOR ) 5 MG tablet Take 5 mg by mouth daily.    [provider]  Spacer/Aero-Holding Chambers (AEROCHAMBER HOLDING CHAMBER) DEVI 1 Device by Does not apply route once for 1 dose. 12/21/22 05/30/23  Margaretann Sharper, MD  tiZANidine (ZANAFLEX) 4 MG tablet Take 4 mg by mouth every 6 (six) hours as needed for muscle spasms.    [provider]  VENTOLIN  HFA 108 (90 Base) MCG/ACT inhaler INHALE 1 TO 2 PUFFS INTO LUNGS EVERY 6 HOURS AS NEEDED FOR WHEEZING OR SHORTNESS OF BREATH 10/16/23   Myer Artis A, MD      Allergies    Iodine  and Codeine    Review of Systems   Review of Systems Negative except as  per HPI Physical Exam Updated Vital Signs BP 134/75 (BP Location: Right Arm)   Pulse 85   Temp 98.4 F (36.9 C) (Oral)   Resp 20   Ht 5\' 2"  (1.575 m)   Wt 95.3 kg   SpO2 98%   BMI 38.41 kg/m  Physical Exam Vitals and nursing note reviewed.  Constitutional:      General: She is not in acute distress.    Appearance: She is well-developed. She is not diaphoretic.  HENT:     Head: Normocephalic and atraumatic.     Nose: Congestion present.     Mouth/Throat:     Mouth: Mucous membranes are moist.  Eyes:     Conjunctiva/sclera: Conjunctivae normal.  Cardiovascular:     Rate and  Rhythm: Normal rate and regular rhythm.     Heart sounds: Normal heart sounds.  Pulmonary:     Effort: Pulmonary effort is normal.  Musculoskeletal:     Cervical back: Neck supple.     Right lower leg: No edema.     Left lower leg: No edema.  Skin:    General: Skin is warm and dry.     Findings: No erythema or rash.  Neurological:     Mental Status: She is alert and oriented to person, place, and time.  Psychiatric:        Behavior: Behavior normal.     ED Results / Procedures / Treatments   Labs (all labs ordered are listed, but only abnormal results are displayed) Labs Reviewed  RESP PANEL BY RT-PCR (RSV, FLU A&B, COVID)  [id number]    EKG None  Radiology DG Chest 2 View Result Date: 01/30/2024 CLINICAL DATA:  Cough, fever EXAM: CHEST - 2 VIEW COMPARISON:  01/15/2023 FINDINGS: Heart and mediastinal contours are within normal limits. No focal opacities or effusions. No acute bony abnormality. IMPRESSION: No active cardiopulmonary disease. Electronically Signed   By: Janeece Mechanic M.D.   On: 01/30/2024 14:20    Procedures Procedures    Medications Ordered in ED Medications  albuterol  (PROVENTIL ) (2.5 MG/3ML) 0.083% nebulizer solution (has no administration in time range)  albuterol  (PROVENTIL ) (2.5 MG/3ML) 0.083% nebulizer solution 2.5 mg (5 mg Nebulization Given 01/30/24 1253)  predniSONE  (DELTASONE ) tablet 60 mg (60 mg Oral Given 01/30/24 1307)    ED Course/ Medical Decision Making/ A&P                                 Medical Decision Making Amount and/or Complexity of Data Reviewed Radiology: ordered.  Risk Prescription drug management.   63 year old female presents with complaint of sneezing, cough with wheezing and chest tightness.  Symptoms started a week ago after traveling by airplane and exposure to someone who was coughing the whole flight.  She notes that she did have a fever last night, has been afebrile in the department.  No relief with home nebulizer.   Expiratory wheezing noted on exam.  She is provided with nebulizer treatment and prednisone  with significant improvement in symptoms.  She does not have any lower extremity edema, does have history of CHF.  Chest x-ray as ordered for myself is negative for acute process, agree with radiology interpretation.  Patient is a diabetic, last A1c is 5.6.  Plan is to discharge with short course of prednisone  although advised to monitor her blood sugars and discontinue if sugars spike.  Return to ER for worsening or concerning symptoms otherwise follow-up  with PCP.        Final Clinical Impression(s) / ED Diagnoses Final diagnoses:  Viral URI with cough  Moderate persistent asthma with exacerbation    Rx / DC Orders ED Discharge Orders          Ordered    predniSONE  (DELTASONE ) 10 MG tablet  Daily        01/30/24 1430              Darlis Eisenmenger, PA-C 01/30/24 1433    Tegeler, Marine Sia, MD 01/30/24 1434

## 2024-01-30 NOTE — Discharge Instructions (Addendum)
 Prednisone  as prescribed. Monitor your blood sugar, stop prednisone  if your blood sugar is high.  Recheck with your primary care provider. Return to ER as needed.

## 2024-02-04 ENCOUNTER — Ambulatory Visit: Payer: Self-pay | Admitting: Pulmonary Disease

## 2024-02-04 NOTE — Telephone Encounter (Signed)
 FYI Only or Action Required?: FYI only for provider  Patient is followed in Pulmonology for COPD, last seen on 08/27/2023 by Margaretann Sharper, MD. Called Nurse Triage reporting No chief complaint on file.. Symptoms began a week ago. Interventions attempted: Maintenance inhaler, rescue inhaler, prescription medication-Prednisone . Symptoms are: unchanged.  Triage Disposition: No disposition on file.  Patient/caregiver understands and will follow disposition?:   Copied from CRM [id number]. Topic: Clinical - Red Word Triage >> Feb 04, 2024 12:25 PM Juliana Ocean wrote: Red Word that prompted transfer to Nurse Triage: SOB/using nebulizer and inhaler round the clock.  Went to ED lat week, onl given steroids.  Daughter James Mcardle thinks she needs abx Reason for Disposition  [1] Longstanding difficulty breathing (e.g., CHF, COPD, emphysema) AND [2] WORSE than normal  Answer Assessment - Initial Assessment Questions 1. RESPIRATORY STATUS: "Describe your breathing?" (e.g., wheezing, shortness of breath, unable to speak, severe coughing)      Shortness of breath, coughing, wheezing 2. ONSET: "When did this breathing problem begin?"      Started a week ago 3. PATTERN "Does the difficult breathing come and go, or has it been constant since it started?"      constant 4. SEVERITY: "How bad is your breathing?" (e.g., mild, moderate, severe)    - MILD: No SOB at rest, mild SOB with walking, speaks normally in sentences, can lie down, no retractions, pulse < 100.    - MODERATE: SOB at rest, SOB with minimal exertion and prefers to sit, cannot lie down flat, speaks in phrases, mild retractions, audible wheezing, pulse 100-120.    - SEVERE: Very SOB at rest, speaks in single words, struggling to breathe, sitting hunched forward, retractions, pulse > 120      Mild-Moderate responds to breathing treatments 5. RECURRENT SYMPTOM: "Have you had difficulty breathing before?" If Yes, ask: "When was the last time?" and "What  happened that time?"      Yes-hx of COPD 6. CARDIAC HISTORY: "Do you have any history of heart disease?" (e.g., heart attack, angina, bypass surgery, angioplasty)      HTN 7. LUNG HISTORY: "Do you have any history of lung disease?"  (e.g., pulmonary embolus, asthma, emphysema)     COPD 8. CAUSE: "What do you think is causing the breathing problem?"      Possible COPD exacerbation 9. OTHER SYMPTOMS: "Do you have any other symptoms? (e.g., dizziness, runny nose, cough, chest pain, fever)     Dizziness, chest tightness 10. O2 SATURATION MONITOR:  "Do you use an oxygen  saturation monitor (pulse oximeter) at home?" If Yes, ask: "What is your reading (oxygen  level) today?" "What is your usual oxygen  saturation reading?" (e.g., 95%)       94 12. TRAVEL: "Have you traveled out of the country in the last month?" (e.g., travel history, exposures)       no Patient went out of town 2 weeks ago to [place]  and came home a week ago with increased shortness of breath, cough and wheezing. Patient was seen in the ED and dx with viral cough. Patient was started on prednisone . Reports using albuterol  nebulizer, inhaler and maintenance inhaler. Scheduled an appointment for tomorrow with pulmonary. Offered to make an appointment today but patient prefers to see her pulmonologist. Gave strict ED precautions.  Protocols used: Breathing Difficulty-A-AH

## 2024-02-05 ENCOUNTER — Encounter: Payer: Self-pay | Admitting: Pulmonary Disease

## 2024-02-05 ENCOUNTER — Ambulatory Visit (INDEPENDENT_AMBULATORY_CARE_PROVIDER_SITE_OTHER): Admitting: Pulmonary Disease

## 2024-02-05 VITALS — BP 128/81 | HR 82 | Temp 98.4°F | Ht 63.0 in | Wt 228.6 lb

## 2024-02-05 DIAGNOSIS — G4733 Obstructive sleep apnea (adult) (pediatric): Secondary | ICD-10-CM

## 2024-02-05 DIAGNOSIS — J4531 Mild persistent asthma with (acute) exacerbation: Secondary | ICD-10-CM

## 2024-02-05 DIAGNOSIS — Z87891 Personal history of nicotine dependence: Secondary | ICD-10-CM

## 2024-02-05 MED ORDER — AMOXICILLIN-POT CLAVULANATE 875-125 MG PO TABS: 1.0000 | ORAL_TABLET | Freq: Two times a day (BID) | ORAL | 0 refills | Status: AC

## 2024-02-05 MED ORDER — PREDNISONE 20 MG PO TABS: 20.0000 mg | ORAL_TABLET | Freq: Every day | ORAL | 0 refills | Status: DC

## 2024-02-05 NOTE — Patient Instructions (Signed)
 Continue using your inhalers  Continue using your CPAP  Will call in a prescription for Augmentin 875 twice daily for 10 days  Will call in a prescription for prednisone  20 mg daily for 5 to 7 days  Call us  with significant concerns  Follow-up in 6 months

## 2024-02-05 NOTE — Progress Notes (Signed)
 Samantha Luna    [id number]    [birth year]  Primary Care Physician:Lucas, Sherald Dienes, FNP  Referring Physician: Luba Running, FNP [address] [place],  [place] [address]  Chief complaint:   History of obstructive sleep apnea Recent URI  HPI:  Continues to use CPAP on a regular basis  Recently traveled out of state came back with a URI Went to the urgent care They gave her a course of steroids  Symptoms are persistent with cough, congestion, some wheezing  Compliant with asthma inhaler Qvar   Continues to use CPAP nightly  Continues to benefit from CPAP  Most recent home sleep study on 12/23/2021 with an AHI of 6  Post knee surgery which she tolerated well-left knee  She currently she recollects that the study showed that she stop breathing about 11 times an hour -Recent study shows AHI of 6  This was done out of state Used CPAP regularly and then got away from using it Usually goes to bed about 7 PM, final wake up time about 4 AM, she does have about 1 or 2 awakenings Denies any headaches in the morning She does have dryness of her mouth in the mornings No family history of sleep apnea known to her She does have a history of hypertension, has had a negative stress test recently, does have a history of angina  PFT from 2020 did reveal mild obstructive disease, mild restriction  Recent CT scan of the chest shows a 4 mm nodule  Outpatient Encounter Medications as of 02/05/2024  Medication Sig   albuterol  (PROVENTIL ) (2.5 MG/3ML) 0.083% nebulizer solution INHALE THE CONTENTS OF 1 VIAL VIA NEBULIZER EVERY 6 HOURS AS NEEDED FOR WHEEZING & SHORTNESS OF BREATH   aspirin  EC 81 MG tablet Take 81 mg by mouth daily. Swallow whole.   beclomethasone (QVAR  REDIHALER) 80 MCG/ACT inhaler 1-2 puffs twice a day. Rinse mouth with water , gargle and spit after use.   escitalopram  (LEXAPRO ) 10 MG tablet Take 10 mg by mouth daily.   ibandronate (BONIVA) 150 MG tablet  Take 150 mg by mouth every 30 (thirty) days.   levocetirizine (XYZAL) 5 MG tablet 5 mg every evening.   losartan -hydrochlorothiazide  (HYZAAR) 100-25 MG tablet Take 1 tablet by mouth daily.   MOUNJARO 2.5 MG/0.5ML Pen Inject 2.5 mg into the skin once a week.   Multiple Vitamins-Minerals (MULTIVITAMIN WITH MINERALS) tablet Take 1 tablet by mouth daily.   MYRBETRIQ  50 MG TB24 tablet Take 50 mg by mouth every other day.   omeprazole (PRILOSEC) 20 MG capsule Take 20 mg by mouth daily.   rosuvastatin  (CRESTOR ) 5 MG tablet Take 5 mg by mouth daily.   Semaglutide,0.25 or 0.5MG /DOS, 2 MG/3ML SOPN Inject 0.25 mg into the skin once a week.   tiZANidine (ZANAFLEX) 4 MG tablet Take 4 mg by mouth every 6 (six) hours as needed for muscle spasms.   VENTOLIN  HFA 108 (90 Base) MCG/ACT inhaler INHALE 1 TO 2 PUFFS INTO LUNGS EVERY 6 HOURS AS NEEDED FOR WHEEZING OR SHORTNESS OF BREATH   Melatonin-Pyridoxine (MELATIN PO) Take by mouth. (Patient not taking: Reported on 02/05/2024)   Spacer/Aero-Holding Chambers (AEROCHAMBER HOLDING CHAMBER) DEVI 1 Device by Does not apply route once for 1 dose.   No facility-administered encounter medications on file as of 02/05/2024.    Allergies as of 02/05/2024 - Review Complete 02/05/2024  Allergen Reaction Noted   Iodine  Other (See Comments) 05/16/2021   Codeine Itching  05/16/2021    Past Medical History:  Diagnosis Date   Acute exacerbation of chronic obstructive airways disease (HCC) 11/09/2017   Hospitalization 11/09/17 in [place]  - See Care Everywhere   Allergic contact dermatitis 09/12/2021   Atherosclerosis of native arteries of extremities with intermittent claudication, right leg (HCC) 08/11/2019   Bell's palsy    Chest pain 05/07/2010   Complication of anesthesia    hysterectomy - slow to wake up   Congestive heart failure (HCC) 04/26/2010   Echo 11/13/20 in [place]  showed normal ejection fraction, normal echo.    COPD (chronic obstructive pulmonary disease)  (HCC) 02/11/2016   Dyspnea    Essential hypertension 08/25/2021   Generalized anxiety disorder 01/06/2022   GERD (gastroesophageal reflux disease)    High cholesterol 02/11/2016   History of COVID-19 07/20/2020   Laryngopharyngeal reflux 08/14/2018   Major depressive disorder    Meningioma (HCC) 04/23/2010   01/28/13 Head CT: A left posterior parafalcine meningioma is seen that measures 1.5 cm,  previously measuring 1.3 cm. Basal cisterns and sulci are unremarkable. Impression:  Left posterior parafalcine meningioma is mildly increased in size since  prior without significant surrounding mass effect or acute process visualized.   Acute or subacute ischemia is not excluded with CT.   Mild cognitive impairment of uncertain or unknown etiology 12/19/2022   Mild persistent asthma 05/01/2018   Obstructive sleep apnea 08/18/2021   variable CPAP use   Osteoarthritis of right knee 11/10/2020   Peripheral vascular disease (HCC)    Pneumonia 2020   Pre-diabetes    Restrictive lung disease 03/05/2018    Past Surgical History:  Procedure Laterality Date   ABDOMINAL HYSTERECTOMY  2017   meniscus repair on right knee  Right 2017   TONSILLECTOMY     as a child   TOTAL KNEE ARTHROPLASTY Left 10/03/2021   Procedure: TOTAL KNEE ARTHROPLASTY;  Surgeon: Liliane Rei, MD;  Location: [facility];  Service: Orthopedics;  Laterality: Left;   TOTAL KNEE ARTHROPLASTY Right 05/22/2022   Procedure: TOTAL KNEE ARTHROPLASTY;  Surgeon: Liliane Rei, MD;  Location: [facility];  Service: Orthopedics;  Laterality: Right;    Family History  Problem Relation Age of Onset   Memory loss Mother    Dementia Father    Asthma Sister    Immunodeficiency Daughter    Asthma Daughter    Anxiety disorder Daughter    Depression Daughter    Depression Daughter    Asthma Daughter    Bipolar disorder Daughter    Immunodeficiency Grandson    Asthma Grandson     Social History   Socioeconomic History   Marital status:  Single    Spouse name: Not on file   Number of children: Not on file   Years of education: 12   Highest education level: High school graduate  Occupational History   Occupation: Retired    Comment: Midwife  Tobacco Use   Smoking status: Former    Current packs/day: 0.00    Average packs/day: 1 pack/day for 40.0 years (40.0 ttl pk-yrs)    Types: Cigarettes    Start date: 08/28/1981    Quit date: 08/28/2021    Years since quitting: 2.4   Smokeless tobacco: Never  Vaping Use   Vaping status: Never Used  Substance and Sexual Activity   Alcohol use: Not Currently    Comment: Very infrequent   Drug use: Not Currently    Types: Marijuana    Comment: Pt reports that a joint will last  her several months   Sexual activity: Not Currently  Other Topics Concern   Not on file  Social History Narrative   Right handed    Social Drivers of Health   Financial Resource Strain: Not at Risk (07/11/2023)   Received from [employer]    Financial Resource Strain: 1  Food Insecurity: Not at Risk (07/14/2022)   Received from [place], [place]   Food Insecurity    Food: 1  Transportation Needs: Not at Risk (07/11/2023)   Received from [employer] Needs    Transportation: 1  Physical Activity: Not at Risk (07/11/2023)   Received from [facility]   Physical Activity    Physical Activity: 1  Stress: Not at Risk (07/11/2023)   Received [facility]   Stress    Stress: 1  Social Connections: Not at Risk (07/11/2023)   Received from [employer]   Social Connections    Connectedness: 1  Intimate Partner Violence: Not At Risk (05/22/2022)   Humiliation, Afraid, Rape, and Kick questionnaire    Fear of Current or Ex-Partner: No    Emotionally Abused: No    Physically Abused: No    Sexually Abused: No    Review of Systems  Constitutional:  Negative for fatigue and fever.  Respiratory:  Positive for cough and shortness of breath.   Psychiatric/Behavioral:  Positive for sleep  disturbance.     Vitals:   02/05/24 0910  BP: 128/81  Pulse: 82  Temp: 98.4 F (36.9 C)  SpO2: 94%       Physical Exam Constitutional:      Appearance: She is obese.  HENT:     Head: Normocephalic.     Right Ear: Tympanic membrane normal.     Nose: No congestion.     Mouth/Throat:     Mouth: Mucous membranes are moist.     Comments: Mallampati 4, crowded oropharynx, macroglossia Eyes:     Pupils: Pupils are equal, round, and reactive to light.  Cardiovascular:     Rate and Rhythm: Normal rate and regular rhythm.     Heart sounds: No murmur heard.    No friction rub.  Pulmonary:     Effort: No respiratory distress.     Breath sounds: No stridor. No wheezing or rhonchi.  Musculoskeletal:     Cervical back: No rigidity or tenderness.  Neurological:     Mental Status: She is alert.  Psychiatric:        Mood and Affect: Mood normal.    Data Reviewed:  CT scan reviewed showing 4 mm nodule  A previous PFT from 2020 was brought in by her daughter showing mild obstructive disease, mild restrictive disease-reviewed  CPAP download not available today  Assessment:   Upper respiratory infection with persistent symptoms - Will call in a course of antibiotics - Called in a course of steroids  History of obstructive sleep apnea Continue CPAP use  History of asthma -Continue Qvar  -Use Qvar  twice a day  Continue albuterol  as needed   Plan/Recommendations:  Continue CPAP  Use Augmentin  Continue bronchodilator treatments  Follow-up in 6 months  Call us  with significant concerns     Myer Artis MD [facility] 02/05/2024, 9:17 AM  CC: Luba Running, FNP

## 2024-02-20 ENCOUNTER — Other Ambulatory Visit: Payer: Self-pay | Admitting: Pulmonary Disease

## 2024-02-20 MED ORDER — QVAR REDIHALER 80 MCG/ACT IN AERB: INHALATION_SPRAY | RESPIRATORY_TRACT | 2 refills | Status: DC

## 2024-02-20 NOTE — Telephone Encounter (Signed)
 Copied from CRM [id number]. Topic: Clinical - Medication Refill >> Feb 20, 2024 11:36 AM Celestine FALCON wrote: Medication: beclomethasone (QVAR  REDIHALER) 80 MCG/ACT inhaler   Has the patient contacted their pharmacy? Yes; the pharmacy actually called in [facility] to refill the medication as there are no refills available.  (Agent: If no, request that the patient contact the pharmacy for the refill. If patient does not wish to contact the pharmacy document the reason why and proceed with request.) (Agent: If yes, when and what did the pharmacy advise?)  This is the patient's preferred pharmacy:   [facility] - [place] [address] [place] [address] Phone: [phone] Fax: [phone]  Is this the correct pharmacy for this prescription? Yes If no, delete pharmacy and type the correct one.   Has the prescription been filled recently? Yes  Is the patient out of the medication? Yes  Has the patient been seen for an appointment in the last year OR does the patient have an upcoming appointment? Yes  Can we respond through MyChart? Yes  Agent: Please be advised that Rx refills may take up to 3 business days. We ask that you follow-up with your pharmacy.

## 2024-03-06 ENCOUNTER — Other Ambulatory Visit: Payer: Self-pay | Admitting: Pulmonary Disease

## 2024-03-24 ENCOUNTER — Other Ambulatory Visit: Payer: Self-pay | Admitting: Pulmonary Disease

## 2024-04-04 ENCOUNTER — Other Ambulatory Visit: Payer: Self-pay

## 2024-04-04 ENCOUNTER — Emergency Department (HOSPITAL_BASED_OUTPATIENT_CLINIC_OR_DEPARTMENT_OTHER)
Admission: EM | Admit: 2024-04-04 | Discharge: 2024-04-04 | Disposition: A | Discharge status: Home / Self Care | Attending: Emergency Medicine | Admitting: Emergency Medicine

## 2024-04-04 ENCOUNTER — Encounter (HOSPITAL_BASED_OUTPATIENT_CLINIC_OR_DEPARTMENT_OTHER): Payer: Self-pay

## 2024-04-04 DIAGNOSIS — M542 Cervicalgia: Secondary | ICD-10-CM | POA: Insufficient documentation

## 2024-04-04 DIAGNOSIS — I110 Hypertensive heart disease with heart failure: Secondary | ICD-10-CM | POA: Insufficient documentation

## 2024-04-04 DIAGNOSIS — J449 Chronic obstructive pulmonary disease, unspecified: Secondary | ICD-10-CM | POA: Diagnosis not present

## 2024-04-04 DIAGNOSIS — Z87891 Personal history of nicotine dependence: Secondary | ICD-10-CM | POA: Insufficient documentation

## 2024-04-04 DIAGNOSIS — I509 Heart failure, unspecified: Secondary | ICD-10-CM | POA: Diagnosis not present

## 2024-04-04 DIAGNOSIS — R202 Paresthesia of skin: Secondary | ICD-10-CM | POA: Diagnosis not present

## 2024-04-04 DIAGNOSIS — Z96653 Presence of artificial knee joint, bilateral: Secondary | ICD-10-CM | POA: Diagnosis not present

## 2024-04-04 DIAGNOSIS — Z8616 Personal history of COVID-19: Secondary | ICD-10-CM | POA: Insufficient documentation

## 2024-04-04 MED ORDER — METHOCARBAMOL 500 MG PO TABS: 500.0000 mg | ORAL_TABLET | Freq: Three times a day (TID) | ORAL | 0 refills | Status: AC | PRN

## 2024-04-04 MED ORDER — ACETAMINOPHEN 500 MG PO TABS
1000.0000 mg | ORAL_TABLET | Freq: Once | ORAL | Status: DC
  Filled 2024-04-04: qty 2

## 2024-04-04 MED ORDER — LIDOCAINE 5 % EX PTCH: 1.0000 | MEDICATED_PATCH | CUTANEOUS | 0 refills | Status: AC

## 2024-04-04 MED ORDER — PREDNISONE 20 MG PO TABS: 40.0000 mg | ORAL_TABLET | Freq: Every day | ORAL | 0 refills | Status: AC

## 2024-04-04 MED ORDER — PREDNISONE 50 MG PO TABS
60.0000 mg | ORAL_TABLET | Freq: Once | ORAL | Status: AC
  Administered 2024-04-04: 60 mg via ORAL
  Filled 2024-04-04: qty 1

## 2024-04-04 NOTE — ED Provider Notes (Signed)
 [facility] Emergency Department Provider Note MRN:  [id number]  Arrival date & time: 04/04/24     Chief Complaint   Neck pain History of Present Illness   Samantha Luna is a 63 y.o. year-old female with a history of CHF, COPD presenting to the ED with chief complaint of neck pain.  Patient has been having chronic thoracic back pain for the past several months.  Woke up 3 days ago with pain in her neck.  Feels like a crick in her neck.  Trouble moving her neck due to pain.  Has noticed some intermittent paresthesias to the left arm over the past few days as well.  Denies numbness or weakness to the legs, no bowel or bladder dysfunction, no fever, no trauma.  Review of Systems  A thorough review of systems was obtained and all systems are negative except as noted in the HPI and PMH.   Patient's Health History    Past Medical History:  Diagnosis Date   Acute exacerbation of chronic obstructive airways disease (HCC) 11/09/2017   Hospitalization 11/09/17 in [place]  - See Care Everywhere   Allergic contact dermatitis 09/12/2021   Atherosclerosis of native arteries of extremities with intermittent claudication, right leg (HCC) 08/11/2019   Bell's palsy    Chest pain 05/07/2010   Complication of anesthesia    hysterectomy - slow to wake up   Congestive heart failure (HCC) 04/26/2010   Echo 11/13/20 in [place]  showed normal ejection fraction, normal echo.    COPD (chronic obstructive pulmonary disease) (HCC) 02/11/2016   Dyspnea    Essential hypertension 08/25/2021   Generalized anxiety disorder 01/06/2022   GERD (gastroesophageal reflux disease)    High cholesterol 02/11/2016   History of COVID-19 07/20/2020   Laryngopharyngeal reflux 08/14/2018   Major depressive disorder    Meningioma (HCC) 04/23/2010   01/28/13 Head CT: A left posterior parafalcine meningioma is seen that measures 1.5 cm,  previously measuring 1.3 cm. Basal cisterns and sulci are  unremarkable. Impression:  Left posterior parafalcine meningioma is mildly increased in size since  prior without significant surrounding mass effect or acute process visualized.   Acute or subacute ischemia is not excluded with CT.   Mild cognitive impairment of uncertain or unknown etiology 12/19/2022   Mild persistent asthma 05/01/2018   Obstructive sleep apnea 08/18/2021   variable CPAP use   Osteoarthritis of right knee 11/10/2020   Peripheral vascular disease (HCC)    Pneumonia 2020   Pre-diabetes    Restrictive lung disease 03/05/2018    Past Surgical History:  Procedure Laterality Date   ABDOMINAL HYSTERECTOMY  2017   meniscus repair on right knee  Right 2017   TONSILLECTOMY     as a child   TOTAL KNEE ARTHROPLASTY Left 10/03/2021   Procedure: TOTAL KNEE ARTHROPLASTY;  Surgeon: Melodi Lerner, MD;  Location: [facility];  Service: Orthopedics;  Laterality: Left;   TOTAL KNEE ARTHROPLASTY Right 05/22/2022   Procedure: TOTAL KNEE ARTHROPLASTY;  Surgeon: Melodi Lerner, MD;  Location: [facility];  Service: Orthopedics;  Laterality: Right;    Family History  Problem Relation Age of Onset   Memory loss Mother    Dementia Father    Asthma Sister    Immunodeficiency Daughter    Asthma Daughter    Anxiety disorder Daughter    Depression Daughter    Depression Daughter    Asthma Daughter    Bipolar disorder Daughter    Immunodeficiency Grandson    Asthma Darden  Social History   Socioeconomic History   Marital status: Single    Spouse name: Not on file   Number of children: Not on file   Years of education: 12   Highest education level: High school graduate  Occupational History   Occupation: Retired    Comment: Midwife  Tobacco Use   Smoking status: Former    Current packs/day: 0.00    Average packs/day: 1 pack/day for 40.0 years (40.0 ttl pk-yrs)    Types: Cigarettes    Start date: 08/28/1981    Quit date: 08/28/2021    Years since quitting: 2.6   Smokeless tobacco:  Never  Vaping Use   Vaping status: Never Used  Substance and Sexual Activity   Alcohol use: Not Currently    Comment: Very infrequent   Drug use: Not Currently    Types: Marijuana    Comment: Pt reports that a joint will last her several months   Sexual activity: Not Currently  Other Topics Concern   Not on file  Social History Narrative   Right handed    Social Drivers of Health   Financial Resource Strain: Not at Risk (07/11/2023)   Received from [employer]    How hard is it for you to pay for the very basics like food, housing, heating, medical care, and medications?: 1  Food Insecurity: Not at Risk (07/14/2022)   Received from [employer]    Food: 1  Transportation Needs: Not at Risk (07/11/2023)   Received from [employer] Needs    In the past 12 months, has lack of transportation kept you from medical appointments, meetings, work or from getting things needed for daily living?: 1  Physical Activity: Not at Risk (07/11/2023)   Received from [facility]   Physical Activity    Weekly Physical Activity: 1  Stress: Not at Risk (07/11/2023)   Received from [facility]   Stress    Do you feel these kinds of stress these days?: 1  Social Connections: Not at Risk (07/11/2023)   Received from [facility]   Social Connections    How often do you see or talk to people that you care about and feel close to? (For example: talking to friends on phone, visiting friends or family, going to church or club meetings): 1  Intimate Partner Violence: Not At Risk (05/22/2022)   Humiliation, Afraid, Rape, and Kick questionnaire    Fear of Current or Ex-Partner: No    Emotionally Abused: No    Physically Abused: No    Sexually Abused: No     Physical Exam   Vitals:   04/04/24 0132 04/04/24 0133  BP: (!) 151/72   Pulse: 78   Resp:  18  Temp:  97.8 F (36.6 C)  SpO2: 96%     CONSTITUTIONAL: Well-appearing, NAD NEURO/PSYCH:  Alert and oriented x 3, no  focal deficits EYES:  eyes equal and reactive ENT/NECK:  no LAD, no JVD CARDIO: Regular rate, well-perfused, normal S1 and S2 PULM:  CTAB no wheezing or rhonchi GI/GU:  non-distended, non-tender MSK/SPINE:  No gross deformities, no edema SKIN:  no rash, atraumatic   *Additional and/or pertinent findings included in MDM below  Diagnostic and Interventional Summary    EKG Interpretation Date/Time:    Ventricular Rate:    PR Interval:    QRS Duration:    QT Interval:    QTC Calculation:   R Axis:  Text Interpretation:         Labs Reviewed - No data to display  No orders to display    Medications  predniSONE  (DELTASONE ) tablet 60 mg (has no administration in time range)  acetaminophen  (TYLENOL ) tablet 1,000 mg (has no administration in time range)     Procedures  /  Critical Care Procedures  ED Course and Medical Decision Making  Initial Impression and Ddx Suspect muscle strain or spasm or radicular neck pain given the paresthesia.  Reassuring neurological exam, otherwise lacking red flag symptoms, highly doubt myelopathy or other emergent process.  Past medical/surgical history that increases complexity of ED encounter: None  Interpretation of Diagnostics Laboratory and/or imaging options to aid in the diagnosis/care of the patient were considered.  After careful history and physical examination, it was determined that there was no indication for diagnostics at this time.  I reviewed her cervical MRI from May of this year  Patient Reassessment and Ultimate Disposition/Management     Discharge  Patient management required discussion with the following services or consulting groups:  None  Complexity of Problems Addressed Acute complicated illness or Injury  Additional Data Reviewed and Analyzed Further history obtained from: None  Additional Factors Impacting ED Encounter Risk Prescriptions  Ozell HERO. Theadore, MD [facility] Emergency Medicine [facility] [email or web address]  Final Clinical Impressions(s) / ED Diagnoses     ICD-10-CM   1. Neck pain  M54.2     2. Paresthesia  R20.2       ED Discharge Orders          Ordered    predniSONE  (DELTASONE ) 20 MG tablet  Daily        04/04/24 0240    lidocaine  (LIDODERM ) 5 %  Every 24 hours        04/04/24 0240    methocarbamol  (ROBAXIN ) 500 MG tablet  Every 8 hours PRN        04/04/24 0240             Discharge Instructions Discussed with and Provided to Patient:    Discharge Instructions      You were evaluated in the Emergency Department and after careful evaluation, we did not find any emergent condition requiring admission or further testing in the hospital.  Your exam/testing today is overall reassuring.  Symptoms may be due to a pinched nerve in your neck or muscle strain or spasm.  Use the numbing patches daily.  Use the Robaxin  muscle relaxers for more significant pain.  Take the prednisone  daily with your first home dose morning of 8-9.  Follow-up with the spine experts.  Please return to the Emergency Department if you experience any worsening of your condition.   Thank you for allowing us  to be a part of your care.      Theadore Ozell HERO, MD 04/04/24 [phone]

## 2024-04-04 NOTE — Discharge Instructions (Signed)
 You were evaluated in the Emergency Department and after careful evaluation, we did not find any emergent condition requiring admission or further testing in the hospital.  Your exam/testing today is overall reassuring.  Symptoms may be due to a pinched nerve in your neck or muscle strain or spasm.  Use the numbing patches daily.  Use the Robaxin  muscle relaxers for more significant pain.  Take the prednisone  daily with your first home dose morning of 8-9.  Follow-up with the spine experts.  Please return to the Emergency Department if you experience any worsening of your condition.   Thank you for allowing us  to be a part of your care.

## 2024-04-04 NOTE — ED Triage Notes (Signed)
 Upper back, neck pain. Bilateral swelling in neck. L arm numbness starting yesterday. Neurologically intact in triage. NIH 0

## 2024-04-22 ENCOUNTER — Telehealth: Payer: Self-pay

## 2024-04-22 NOTE — Telephone Encounter (Signed)
 Copied from CRM [id number]. Topic: Clinical - Request for Lab/Test Order >> Apr 22, 2024  9:38 AM Benton KIDD wrote: Reason for CRM:  patient is calling because she want to do a sleep study test since she has stopped smoking . Patient says she has not had one since about 3 years and wanting to take one to see where she is at . Please reach out to patient for further assistance concerning a sleep study test   [id number]   ----------------------------------------------------------------------- From previous Reason for Contact - Lab/Test Results: Reason for CRM: patient is calling because she want to do a sleep study test since she has stopped smoking . Patient says she has not had one since about 3 years and wanting to take one to see where she is at . Please reach ut to patient for further assistance   Dr. Neda please advise pt is requesting sleep study.

## 2024-04-24 ENCOUNTER — Other Ambulatory Visit: Payer: Self-pay | Admitting: Pulmonary Disease

## 2024-04-24 DIAGNOSIS — G4733 Obstructive sleep apnea (adult) (pediatric): Secondary | ICD-10-CM

## 2024-04-24 NOTE — Telephone Encounter (Signed)
 Yes, we can place an order for home sleep test

## 2024-04-25 NOTE — Telephone Encounter (Signed)
 Order has been placed for a HST.

## 2024-04-29 ENCOUNTER — Other Ambulatory Visit: Payer: Self-pay | Admitting: Pulmonary Disease

## 2024-05-13 ENCOUNTER — Other Ambulatory Visit: Payer: Self-pay

## 2024-05-13 MED ORDER — QVAR REDIHALER 80 MCG/ACT IN AERB: INHALATION_SPRAY | RESPIRATORY_TRACT | 2 refills | Status: DC

## 2024-05-16 ENCOUNTER — Other Ambulatory Visit: Payer: Self-pay | Admitting: Pulmonary Disease

## 2024-05-28 ENCOUNTER — Encounter

## 2024-05-28 DIAGNOSIS — G4733 Obstructive sleep apnea (adult) (pediatric): Secondary | ICD-10-CM

## 2024-06-09 ENCOUNTER — Telehealth: Payer: Self-pay | Admitting: Pulmonary Disease

## 2024-06-09 DIAGNOSIS — G4733 Obstructive sleep apnea (adult) (pediatric): Secondary | ICD-10-CM

## 2024-06-09 NOTE — Telephone Encounter (Signed)
 Call patient  Sleep study result  Date of study:  05/28/2024  Impression: Moderate obstructive sleep apnea with severe oxygen  desaturations AHI of 15 with an O2 nadir of 68%  Recommendation: DME referral  Recommend CPAP therapy for moderate obstructive sleep apnea.  Auto-titrating CPAP with pressure settings of 5-15 should be appropriate, along with heated humidification using the patient's preferred mask.  Encourage weight loss measures.  Schedule follow-up in the office 4 to 6 weeks after initiation of treatment  Appears patient is already on CPAP therapy

## 2024-06-09 NOTE — Telephone Encounter (Signed)
 Called and spoke with patient informed of results.order placed

## 2024-07-04 ENCOUNTER — Other Ambulatory Visit: Payer: Self-pay

## 2024-07-04 DIAGNOSIS — Z122 Encounter for screening for malignant neoplasm of respiratory organs: Secondary | ICD-10-CM

## 2024-07-04 DIAGNOSIS — Z87891 Personal history of nicotine dependence: Secondary | ICD-10-CM

## 2024-07-16 ENCOUNTER — Encounter: Payer: Self-pay | Admitting: Neurology

## 2024-08-04 ENCOUNTER — Telehealth: Payer: Self-pay | Admitting: *Deleted

## 2024-08-04 NOTE — Telephone Encounter (Signed)
 LDCT was ordered by another provider whom patient no longer sees. Patient would like for you to order.  Copied from CRM #[id number]. Topic: Clinical - Request for Lab/Test Order >> Aug 01, 2024  1:32 PM Samantha Luna wrote: Reason for CRM: Patient would like to get the annual LCS, she had her last one on 07/22/23. She would like Dr. MALVA to put in an order.

## 2024-08-13 ENCOUNTER — Other Ambulatory Visit: Payer: Self-pay | Admitting: Pulmonary Disease

## 2024-08-13 DIAGNOSIS — Z87891 Personal history of nicotine dependence: Secondary | ICD-10-CM

## 2024-08-14 ENCOUNTER — Other Ambulatory Visit: Payer: Self-pay

## 2024-08-14 MED ORDER — QVAR REDIHALER 80 MCG/ACT IN AERB: INHALATION_SPRAY | RESPIRATORY_TRACT | 3 refills | Status: DC

## 2024-08-15 ENCOUNTER — Other Ambulatory Visit: Payer: Self-pay | Admitting: Family

## 2024-08-15 DIAGNOSIS — R221 Localized swelling, mass and lump, neck: Secondary | ICD-10-CM

## 2024-08-22 ENCOUNTER — Ambulatory Visit
Admission: RE | Admit: 2024-08-22 | Discharge: 2024-08-22 | Disposition: A | Discharge status: Home / Self Care | Source: Ambulatory Visit | Attending: Pulmonary Disease | Admitting: Pulmonary Disease

## 2024-08-22 DIAGNOSIS — Z87891 Personal history of nicotine dependence: Secondary | ICD-10-CM

## 2024-10-01 ENCOUNTER — Other Ambulatory Visit: Payer: Self-pay

## 2024-10-01 MED ORDER — QVAR REDIHALER 80 MCG/ACT IN AERB: INHALATION_SPRAY | RESPIRATORY_TRACT | 3 refills | Status: AC

## 2024-11-06 ENCOUNTER — Ambulatory Visit: Admitting: Pulmonary Disease

## 2024-12-31 ENCOUNTER — Ambulatory Visit: Admitting: Neurology
